# Patient Record
Sex: Male | Born: 1955 | Race: Black or African American | Hispanic: No | Marital: Single | Smoking: Current every day smoker
Health system: Southern US, Community
[De-identification: ages and names within clinical notes are randomized; demographics above are authoritative.]

## PROBLEM LIST (undated history)

## (undated) DIAGNOSIS — F79 Unspecified intellectual disabilities: Secondary | ICD-10-CM

## (undated) DIAGNOSIS — E7800 Pure hypercholesterolemia, unspecified: Secondary | ICD-10-CM

## (undated) DIAGNOSIS — F419 Anxiety disorder, unspecified: Secondary | ICD-10-CM

## (undated) DIAGNOSIS — I10 Essential (primary) hypertension: Secondary | ICD-10-CM

## (undated) DIAGNOSIS — N400 Enlarged prostate without lower urinary tract symptoms: Secondary | ICD-10-CM

## (undated) DIAGNOSIS — K219 Gastro-esophageal reflux disease without esophagitis: Secondary | ICD-10-CM

## (undated) DIAGNOSIS — K810 Acute cholecystitis: Secondary | ICD-10-CM

## (undated) DIAGNOSIS — R1110 Vomiting, unspecified: Secondary | ICD-10-CM

## (undated) DIAGNOSIS — F209 Schizophrenia, unspecified: Secondary | ICD-10-CM

## (undated) DIAGNOSIS — G4733 Obstructive sleep apnea (adult) (pediatric): Secondary | ICD-10-CM

## (undated) DIAGNOSIS — K222 Esophageal obstruction: Secondary | ICD-10-CM

## (undated) HISTORY — PX: OTHER SURGICAL HISTORY: SHX169

## (undated) HISTORY — DX: Unspecified intellectual disabilities: F79

## (undated) HISTORY — DX: Obstructive sleep apnea (adult) (pediatric): G47.33

## (undated) HISTORY — DX: Vomiting, unspecified: R11.10

---

## 1999-10-04 ENCOUNTER — Ambulatory Visit (HOSPITAL_BASED_OUTPATIENT_CLINIC_OR_DEPARTMENT_OTHER): Admission: RE | Admit: 1999-10-04 | Discharge: 1999-10-04 | Payer: Self-pay | Admitting: Oral Surgery

## 2008-07-16 ENCOUNTER — Emergency Department (HOSPITAL_COMMUNITY): Admission: EM | Admit: 2008-07-16 | Discharge: 2008-07-16 | Payer: Self-pay | Admitting: Emergency Medicine

## 2013-12-24 ENCOUNTER — Encounter (HOSPITAL_COMMUNITY): Payer: Self-pay | Admitting: Emergency Medicine

## 2013-12-24 ENCOUNTER — Emergency Department (HOSPITAL_COMMUNITY)
Admission: EM | Admit: 2013-12-24 | Discharge: 2013-12-24 | Disposition: A | Discharge status: Home / Self Care | Payer: Medicare Other | Attending: Emergency Medicine | Admitting: Emergency Medicine

## 2013-12-24 ENCOUNTER — Emergency Department (HOSPITAL_COMMUNITY): Payer: Medicare Other

## 2013-12-24 DIAGNOSIS — R197 Diarrhea, unspecified: Secondary | ICD-10-CM | POA: Insufficient documentation

## 2013-12-24 DIAGNOSIS — Z79899 Other long term (current) drug therapy: Secondary | ICD-10-CM | POA: Insufficient documentation

## 2013-12-24 DIAGNOSIS — F209 Schizophrenia, unspecified: Secondary | ICD-10-CM | POA: Insufficient documentation

## 2013-12-24 DIAGNOSIS — IMO0002 Reserved for concepts with insufficient information to code with codable children: Secondary | ICD-10-CM | POA: Diagnosis not present

## 2013-12-24 DIAGNOSIS — I10 Essential (primary) hypertension: Secondary | ICD-10-CM | POA: Diagnosis not present

## 2013-12-24 DIAGNOSIS — R112 Nausea with vomiting, unspecified: Secondary | ICD-10-CM | POA: Insufficient documentation

## 2013-12-24 DIAGNOSIS — R109 Unspecified abdominal pain: Secondary | ICD-10-CM | POA: Diagnosis not present

## 2013-12-24 DIAGNOSIS — F17200 Nicotine dependence, unspecified, uncomplicated: Secondary | ICD-10-CM | POA: Diagnosis not present

## 2013-12-24 DIAGNOSIS — R911 Solitary pulmonary nodule: Secondary | ICD-10-CM | POA: Diagnosis not present

## 2013-12-24 HISTORY — DX: Schizophrenia, unspecified: F20.9

## 2013-12-24 HISTORY — DX: Essential (primary) hypertension: I10

## 2013-12-24 LAB — COMPREHENSIVE METABOLIC PANEL
ALBUMIN: 4.6 g/dL (ref 3.5–5.2)
ALK PHOS: 75 U/L (ref 39–117)
ALT: 24 U/L (ref 0–53)
AST: 26 U/L (ref 0–37)
BILIRUBIN TOTAL: 0.3 mg/dL (ref 0.3–1.2)
BUN: 16 mg/dL (ref 6–23)
CHLORIDE: 96 meq/L (ref 96–112)
CO2: 31 mEq/L (ref 19–32)
Calcium: 9.6 mg/dL (ref 8.4–10.5)
Creatinine, Ser: 1.28 mg/dL (ref 0.50–1.35)
GFR calc Af Amer: 70 mL/min — ABNORMAL LOW (ref >90)
GFR calc non Af Amer: 61 mL/min — ABNORMAL LOW (ref >90)
Glucose, Bld: 119 mg/dL — ABNORMAL HIGH (ref 70–99)
Potassium: 3.3 mEq/L — ABNORMAL LOW (ref 3.7–5.3)
SODIUM: 141 meq/L (ref 137–147)
Total Protein: 8.4 g/dL — ABNORMAL HIGH (ref 6.0–8.3)

## 2013-12-24 LAB — CBC WITH DIFFERENTIAL/PLATELET
Basophils Absolute: 0 10*3/uL (ref 0.0–0.1)
Basophils Relative: 0 % (ref 0–1)
EOS PCT: 0 % (ref 0–5)
Eosinophils Absolute: 0 10*3/uL (ref 0.0–0.7)
HEMATOCRIT: 45.3 % (ref 39.0–52.0)
Hemoglobin: 15.8 g/dL (ref 13.0–17.0)
LYMPHS ABS: 1.3 10*3/uL (ref 0.7–4.0)
Lymphocytes Relative: 21 % (ref 12–46)
MCH: 31.3 pg (ref 26.0–34.0)
MCHC: 34.9 g/dL (ref 30.0–36.0)
MCV: 89.9 fL (ref 78.0–100.0)
MONO ABS: 0.3 10*3/uL (ref 0.1–1.0)
Monocytes Relative: 4 % (ref 3–12)
Neutro Abs: 4.7 10*3/uL (ref 1.7–7.7)
Neutrophils Relative %: 74 % (ref 43–77)
Platelets: 177 10*3/uL (ref 150–400)
RBC: 5.04 MIL/uL (ref 4.22–5.81)
RDW: 12.8 % (ref 11.5–15.5)
WBC: 6.4 10*3/uL (ref 4.0–10.5)

## 2013-12-24 LAB — URINALYSIS, ROUTINE W REFLEX MICROSCOPIC
BILIRUBIN URINE: NEGATIVE
Glucose, UA: NEGATIVE mg/dL
HGB URINE DIPSTICK: NEGATIVE
KETONES UR: NEGATIVE mg/dL
Leukocytes, UA: NEGATIVE
NITRITE: NEGATIVE
PH: 6.5 (ref 5.0–8.0)
Protein, ur: NEGATIVE mg/dL
SPECIFIC GRAVITY, URINE: 1.02 (ref 1.005–1.030)
Urobilinogen, UA: 0.2 mg/dL (ref 0.0–1.0)

## 2013-12-24 LAB — LIPASE, BLOOD: Lipase: 20 U/L (ref 11–59)

## 2013-12-24 MED ORDER — HYDROCODONE-ACETAMINOPHEN 5-325 MG PO TABS: 1.0000 | ORAL_TABLET | ORAL | Status: DC | PRN

## 2013-12-24 MED ORDER — PROMETHAZINE HCL 25 MG PO TABS: 25.0000 mg | ORAL_TABLET | Freq: Four times a day (QID) | ORAL | Status: DC | PRN

## 2013-12-24 MED ORDER — MORPHINE SULFATE 4 MG/ML IJ SOLN
4.0000 mg | Freq: Once | INTRAMUSCULAR | Status: AC
  Administered 2013-12-24: 4 mg via INTRAVENOUS
  Filled 2013-12-24: qty 1

## 2013-12-24 MED ORDER — SODIUM CHLORIDE 0.9 % IV BOLUS (SEPSIS)
1000.0000 mL | Freq: Once | INTRAVENOUS | Status: AC
  Administered 2013-12-24: 1000 mL via INTRAVENOUS

## 2013-12-24 MED ORDER — ONDANSETRON HCL 4 MG/2ML IJ SOLN
4.0000 mg | Freq: Once | INTRAMUSCULAR | Status: AC
  Administered 2013-12-24: 4 mg via INTRAVENOUS
  Filled 2013-12-24: qty 2

## 2013-12-24 NOTE — Discharge Instructions (Signed)
Abdominal Pain Many things can cause belly (abdominal) pain. Most times, the belly pain is not dangerous. The amount of belly pain does not tell how serious the problem may be. Many cases of belly pain can be watched and treated at home. HOME CARE   Do not take medicines that help you go poop (laxatives) unless told to by your doctor.  Only take medicine as told by your doctor.  Eat or drink as told by your doctor. Your doctor will tell you if you should be on a special diet. GET HELP RIGHT AWAY IF:   The pain does not go away.  You have a fever.  You keep throwing up (vomiting).  The pain changes and is only in the right or left part of the belly.  You have bloody or tarry looking poop. MAKE SURE YOU:   Understand these instructions.  Will watch your condition.  Will get help right away if you are not doing well or get worse. Document Released: 05/20/2008 Document Revised: 02/24/2012 Document Reviewed: 12/18/2009 [facility] Patient Information 2014 [place], [place].   Medication for pain and nausea. Return if worse. Clear liquids tonight.  Chest x-ray shows a nodule in the right lung.    This will need followup with the primary care physician.

## 2013-12-24 NOTE — ED Notes (Signed)
Patient with no complaints at this time. Respirations even and unlabored. Skin warm/dry. Discharge instructions reviewed with patient at this time. Patient given opportunity to voice concerns/ask questions. Patient discharged at this time and left Emergency Department with steady gait.   

## 2013-12-24 NOTE — ED Notes (Signed)
Denies nausea.  States abdomen is "sore".

## 2013-12-24 NOTE — ED Provider Notes (Signed)
CSN: [id number]     Arrival date & time 12/24/13  1559 History   First MD Initiated Contact with Patient 12/24/13 1623     Chief Complaint  Patient presents with  . Abdominal Pain   (Consider location/radiation/quality/duration/timing/severity/associated sxs/prior Treatment) HPI... level V caveat for schizophrenia.   Lower abdominal pain after eating hamburger and french fries earlier today with associated nausea, vomiting, diarrhea. He lives in a group home and is accompanied by a caregiver.  No fever, chills, dysuria, cough, stiff neck.  Past Medical History  Diagnosis Date  . Hypertension   . Schizophrenia    History reviewed. No pertinent past surgical history. History reviewed. No pertinent family history. History  Substance Use Topics  . Smoking status: Current Every Day Smoker  . Smokeless tobacco: Not on file  . Alcohol Use: No    Review of Systems  Unable to perform ROS: Psychiatric disorder    Allergies  Review of patient's allergies indicates no known allergies.  Home Medications   Current Outpatient Rx  Name  Route  Sig  Dispense  Refill  . acetaminophen (TYLENOL) 325 MG tablet   Oral   Take 650 mg by mouth every 6 (six) hours as needed for mild pain, moderate pain or fever.         [name] amLODipine (NORVASC) 5 MG tablet   Oral   Take 5 mg by mouth every morning.         [name] buPROPion (WELLBUTRIN SR) 150 MG 12 hr tablet   Oral   Take 150 mg by mouth 2 (two) times daily.         . cyclobenzaprine (FLEXERIL) 10 MG tablet   Oral   Take 10 mg by mouth at bedtime.         . famotidine (PEPCID) 40 MG tablet   Oral   Take 40 mg by mouth at bedtime.         . fluticasone (FLONASE) 50 MCG/ACT nasal spray   Each Nare   Place 2 sprays into both nostrils daily.         [name] gemfibrozil (LOPID) 600 MG tablet   Oral   Take 600 mg by mouth 2 (two) times daily.         [name] guaifenesin (Q-TUSSIN) 100 MG/5ML syrup   Oral   Take 200 mg by mouth every 4  (four) hours as needed for cough.         . metoCLOPramide (REGLAN) 5 MG tablet   Oral   Take 5 mg by mouth 4 (four) times daily -  before meals and at bedtime.         . metoprolol (LOPRESSOR) 50 MG tablet   Oral   Take 50 mg by mouth 2 (two) times daily.         [name] omeprazole (PRILOSEC) 20 MG capsule   Oral   Take 20 mg by mouth daily before breakfast.         . oxybutynin (DITROPAN) 5 MG tablet   Oral   Take 5 mg by mouth every morning.         . thioridazine (MELLARIL) 10 MG tablet   Oral   Take 150 mg by mouth 2 (two) times daily.         [name] triamterene-hydrochlorothiazide (MAXZIDE-25) 37.5-25 MG per tablet   Oral   Take 1 tablet by mouth every morning.         [name] HYDROcodone-acetaminophen (NORCO) 5-325 MG per  tablet   Oral   Take 1 tablet by mouth every 4 (four) hours as needed.   10 tablet   0   . promethazine (PHENERGAN) 25 MG tablet   Oral   Take 1 tablet (25 mg total) by mouth every 6 (six) hours as needed for nausea or vomiting.   10 tablet   0    BP 146/81  Pulse 66  Temp(Src) 97.5 F (36.4 C) (Oral)  Resp 20  SpO2 96% Physical Exam  Nursing note and vitals reviewed. Constitutional: He is oriented to person, place, and time. He appears well-developed and well-nourished.  HENT:  Head: Normocephalic and atraumatic.  Eyes: Conjunctivae and EOM are normal. Pupils are equal, round, and reactive to light.  Neck: Normal range of motion. Neck supple.  Cardiovascular: Normal rate, regular rhythm and normal heart sounds.   Pulmonary/Chest: Effort normal and breath sounds normal.  Abdominal: Soft. Bowel sounds are normal.   Minimal lower abdominal tenderness.  Musculoskeletal: Normal range of motion.  Neurological: He is alert and oriented to person, place, and time.  Skin: Skin is warm and dry.  Psychiatric:  Flat affect     ED Course  Procedures (including critical care time) Labs Review Labs Reviewed  COMPREHENSIVE METABOLIC PANEL -  Abnormal; Notable for the following:    Potassium 3.3 (*)    Glucose, Bld 119 (*)    Total Protein 8.4 (*)    GFR calc non Af Amer 61 (*)    GFR calc Af Amer 70 (*)    All other components within normal limits  CBC WITH DIFFERENTIAL  LIPASE, BLOOD  URINALYSIS, ROUTINE W REFLEX MICROSCOPIC   Imaging Review Dg Abd Acute W/chest  12/24/2013   CLINICAL DATA:  Abdominal pain  EXAM: ACUTE ABDOMEN SERIES (ABDOMEN 2 VIEW & CHEST 1 VIEW)  COMPARISON:  None.  FINDINGS: Cardiac shadow is mildly enlarged. The lungs are well aerated bilaterally. There is a rounded density measuring 2.5 cm in the right upper lung. Although this may be related to the anterior aspect of the right 1st rib this likely represents an underlying lung nodule further evaluation is recommended.  The abdomen shows a nonobstructive bowel gas pattern. No free air is seen. No abnormal mass or abnormal calcifications are noted. The bony structures are within normal limits.  IMPRESSION: 2.5 cm nodule overlying the right upper lobe. Further evaluation is recommended.  Nonspecific abdomen.   Electronically Signed   By: Alcide CleverMark  Lukens M.D.   On: 12/24/2013 19:11    EKG Interpretation   None       MDM   1. Abdominal pain    Patient feels better after IV fluids and pain management. Screening labs, urinalysis, acute abdominal series negative for acute findings.   2.5 cm nodule noted in right upper lobe of lung.  This was discussed with the caregiver. Patient has primary care followup.    Donnetta HutchingBrian Naba Sneed, MD 12/25/13 2136

## 2013-12-24 NOTE — ED Notes (Addendum)
abd pain ,onset after eating hamburger and fries, nausea, vomiting,diarrhea  Pt is from [facility]. Very slow to answer. Med list brought with him , but no history.

## 2013-12-24 NOTE — ED Notes (Signed)
Pt unable to urinate at this time.  

## 2013-12-29 DIAGNOSIS — B351 Tinea unguium: Secondary | ICD-10-CM | POA: Diagnosis not present

## 2013-12-29 DIAGNOSIS — I739 Peripheral vascular disease, unspecified: Secondary | ICD-10-CM | POA: Diagnosis not present

## 2014-01-11 DIAGNOSIS — K5900 Constipation, unspecified: Secondary | ICD-10-CM | POA: Diagnosis not present

## 2014-01-11 DIAGNOSIS — R3989 Other symptoms and signs involving the genitourinary system: Secondary | ICD-10-CM | POA: Diagnosis not present

## 2014-01-11 DIAGNOSIS — I10 Essential (primary) hypertension: Secondary | ICD-10-CM | POA: Diagnosis not present

## 2014-02-15 DIAGNOSIS — R39198 Other difficulties with micturition: Secondary | ICD-10-CM | POA: Diagnosis not present

## 2014-02-15 DIAGNOSIS — R3911 Hesitancy of micturition: Secondary | ICD-10-CM | POA: Diagnosis not present

## 2014-02-15 DIAGNOSIS — R351 Nocturia: Secondary | ICD-10-CM | POA: Diagnosis not present

## 2014-03-16 DIAGNOSIS — B351 Tinea unguium: Secondary | ICD-10-CM | POA: Diagnosis not present

## 2014-03-16 DIAGNOSIS — I739 Peripheral vascular disease, unspecified: Secondary | ICD-10-CM | POA: Diagnosis not present

## 2014-03-23 DIAGNOSIS — R351 Nocturia: Secondary | ICD-10-CM | POA: Diagnosis not present

## 2014-03-23 DIAGNOSIS — R3911 Hesitancy of micturition: Secondary | ICD-10-CM | POA: Diagnosis not present

## 2014-03-23 DIAGNOSIS — R39198 Other difficulties with micturition: Secondary | ICD-10-CM | POA: Diagnosis not present

## 2014-03-31 ENCOUNTER — Encounter (HOSPITAL_COMMUNITY): Payer: Self-pay | Admitting: Emergency Medicine

## 2014-03-31 ENCOUNTER — Emergency Department (HOSPITAL_COMMUNITY): Payer: Medicare Other

## 2014-03-31 ENCOUNTER — Emergency Department (HOSPITAL_COMMUNITY)
Admission: EM | Admit: 2014-03-31 | Discharge: 2014-04-01 | Disposition: A | Discharge status: Home / Self Care | Payer: Medicare Other | Attending: Emergency Medicine | Admitting: Emergency Medicine

## 2014-03-31 DIAGNOSIS — K2970 Gastritis, unspecified, without bleeding: Secondary | ICD-10-CM | POA: Diagnosis not present

## 2014-03-31 DIAGNOSIS — F17200 Nicotine dependence, unspecified, uncomplicated: Secondary | ICD-10-CM | POA: Insufficient documentation

## 2014-03-31 DIAGNOSIS — F209 Schizophrenia, unspecified: Secondary | ICD-10-CM | POA: Insufficient documentation

## 2014-03-31 DIAGNOSIS — R1110 Vomiting, unspecified: Secondary | ICD-10-CM | POA: Diagnosis not present

## 2014-03-31 DIAGNOSIS — Z79899 Other long term (current) drug therapy: Secondary | ICD-10-CM | POA: Diagnosis not present

## 2014-03-31 DIAGNOSIS — I10 Essential (primary) hypertension: Secondary | ICD-10-CM | POA: Diagnosis not present

## 2014-03-31 DIAGNOSIS — R1084 Generalized abdominal pain: Secondary | ICD-10-CM | POA: Diagnosis not present

## 2014-03-31 DIAGNOSIS — IMO0002 Reserved for concepts with insufficient information to code with codable children: Secondary | ICD-10-CM | POA: Diagnosis not present

## 2014-03-31 DIAGNOSIS — K759 Inflammatory liver disease, unspecified: Secondary | ICD-10-CM | POA: Diagnosis not present

## 2014-03-31 DIAGNOSIS — R109 Unspecified abdominal pain: Secondary | ICD-10-CM | POA: Diagnosis not present

## 2014-03-31 DIAGNOSIS — K7200 Acute and subacute hepatic failure without coma: Secondary | ICD-10-CM | POA: Diagnosis not present

## 2014-03-31 LAB — COMPREHENSIVE METABOLIC PANEL
ALBUMIN: 3.8 g/dL (ref 3.5–5.2)
ALK PHOS: 165 U/L — AB (ref 39–117)
ALT: 125 U/L — ABNORMAL HIGH (ref 0–53)
AST: 252 U/L — ABNORMAL HIGH (ref 0–37)
BUN: 11 mg/dL (ref 6–23)
CALCIUM: 9.3 mg/dL (ref 8.4–10.5)
CO2: 30 mEq/L (ref 19–32)
CREATININE: 1.09 mg/dL (ref 0.50–1.35)
Chloride: 97 mEq/L (ref 96–112)
GFR calc non Af Amer: 74 mL/min — ABNORMAL LOW (ref >90)
GFR, EST AFRICAN AMERICAN: 85 mL/min — AB (ref >90)
GLUCOSE: 104 mg/dL — AB (ref 70–99)
POTASSIUM: 3.7 meq/L (ref 3.7–5.3)
Sodium: 140 mEq/L (ref 137–147)
TOTAL PROTEIN: 7.7 g/dL (ref 6.0–8.3)
Total Bilirubin: 2.3 mg/dL — ABNORMAL HIGH (ref 0.3–1.2)

## 2014-03-31 LAB — CBC WITH DIFFERENTIAL/PLATELET
BASOS PCT: 0 % (ref 0–1)
Basophils Absolute: 0 10*3/uL (ref 0.0–0.1)
EOS PCT: 0 % (ref 0–5)
Eosinophils Absolute: 0 10*3/uL (ref 0.0–0.7)
HEMATOCRIT: 45.3 % (ref 39.0–52.0)
HEMOGLOBIN: 15.9 g/dL (ref 13.0–17.0)
LYMPHS ABS: 0.9 10*3/uL (ref 0.7–4.0)
Lymphocytes Relative: 12 % (ref 12–46)
MCH: 30.6 pg (ref 26.0–34.0)
MCHC: 35.1 g/dL (ref 30.0–36.0)
MCV: 87.3 fL (ref 78.0–100.0)
MONO ABS: 0.6 10*3/uL (ref 0.1–1.0)
Monocytes Relative: 8 % (ref 3–12)
NEUTROS PCT: 80 % — AB (ref 43–77)
Neutro Abs: 5.8 10*3/uL (ref 1.7–7.7)
Platelets: 201 10*3/uL (ref 150–400)
RBC: 5.19 MIL/uL (ref 4.22–5.81)
RDW: 13 % (ref 11.5–15.5)
WBC: 7.3 10*3/uL (ref 4.0–10.5)

## 2014-03-31 LAB — LIPASE, BLOOD: LIPASE: 19 U/L (ref 11–59)

## 2014-03-31 MED ORDER — ONDANSETRON HCL 4 MG/2ML IJ SOLN
4.0000 mg | Freq: Once | INTRAMUSCULAR | Status: AC
  Administered 2014-03-31: 4 mg via INTRAVENOUS
  Filled 2014-03-31: qty 2

## 2014-03-31 MED ORDER — HYDROMORPHONE HCL PF 1 MG/ML IJ SOLN
0.5000 mg | Freq: Once | INTRAMUSCULAR | Status: AC
  Administered 2014-03-31: 0.5 mg via INTRAVENOUS
  Filled 2014-03-31: qty 1

## 2014-03-31 MED ORDER — SODIUM CHLORIDE 0.9 % IV BOLUS (SEPSIS)
1000.0000 mL | Freq: Once | INTRAVENOUS | Status: AC
  Administered 2014-03-31: 1000 mL via INTRAVENOUS

## 2014-03-31 NOTE — ED Notes (Signed)
Pt reports generalized abd pain and vomiting for several days, no diarrhea.

## 2014-03-31 NOTE — ED Provider Notes (Signed)
CSN: [id number]     Arrival date & time    History  This chart was scribed for Kyle LennertJoseph L Lamiyah Schlotter, MD by Dorothey Basemania Sutton, ED Scribe. This patient was seen in room APA06/APA06 and the patient's care was started at 8:37 PM.    Chief Complaint  Patient presents with  . Abdominal Pain  . Emesis   Patient is a 58 y.o. male presenting with abdominal pain. The history is provided by the patient and the EMS personnel. No language interpreter was used.  Abdominal Pain Pain location:  Generalized Pain severity:  Unable to specify Onset quality:  Unable to specify Timing:  Unable to specify Progression:  Unable to specify Chronicity:  New Associated symptoms: nausea and vomiting   Associated symptoms: no chest pain, no cough, no fatigue and no hematuria   Nausea:    Severity:  Unable to specify   Onset quality:  Unable to specify   Timing:  Unable to specify   Progression:  Unable to specify Vomiting:    Quality:  Unable to specify   Severity:  Unable to specify   Timing:  Unable to specify   Progression:  Unable to specify Risk factors: has not had multiple surgeries    HPI Comments: Kyle Wade is a 58 y.o. Male with a history of schizophrenia brought in by EMS who presents to the Emergency Department complaining of an intermittent pain to the generalized abdomen with associated nausea and a few episodes of non-bilious, non-bloody emesis onset a few days ago (per EMS). When asked what his symptoms are, patient responds with "I don't know, they brought me up here because it hurts, but it's not that bad" and is unable to better specify the nature of his complaints. Patient also has a history of HTN .  Past Medical History  Diagnosis Date  . Hypertension   . Schizophrenia    History reviewed. No pertinent past surgical history. No family history on file. History  Substance Use Topics  . Smoking status: Current Every Day Smoker  . Smokeless tobacco: Not on file  . Alcohol Use: No     Review of Systems  Constitutional: Negative for appetite change and fatigue.  HENT: Negative for congestion, ear discharge and sinus pressure.   Eyes: Negative for discharge.  Respiratory: Negative for cough.   Cardiovascular: Negative for chest pain.  Gastrointestinal: Positive for nausea, vomiting and abdominal pain.  Genitourinary: Negative for frequency and hematuria.  Musculoskeletal: Negative for back pain.  Skin: Negative for rash.  Neurological: Negative for seizures.  Psychiatric/Behavioral: Negative for hallucinations.    Allergies  Review of patient's allergies indicates no known allergies.  Home Medications   Prior to Admission medications   Medication Sig Start Date End Date Taking? Authorizing Provider  acetaminophen (TYLENOL) 325 MG tablet Take 650 mg by mouth every 6 (six) hours as needed for mild pain, moderate pain or fever.    Historical Provider, MD  amLODipine (NORVASC) 5 MG tablet Take 5 mg by mouth every morning.    Historical Provider, MD  buPROPion (WELLBUTRIN SR) 150 MG 12 hr tablet Take 150 mg by mouth 2 (two) times daily.    Historical Provider, MD  cyclobenzaprine (FLEXERIL) 10 MG tablet Take 10 mg by mouth at bedtime.    Historical Provider, MD  famotidine (PEPCID) 40 MG tablet Take 40 mg by mouth at bedtime.    Historical Provider, MD  fluticasone (FLONASE) 50 MCG/ACT nasal spray Place 2 sprays into both nostrils  daily.    Historical Provider, MD  gemfibrozil (LOPID) 600 MG tablet Take 600 mg by mouth 2 (two) times daily.    Historical Provider, MD  guaifenesin (Q-TUSSIN) 100 MG/5ML syrup Take 200 mg by mouth every 4 (four) hours as needed for cough.    Historical Provider, MD  HYDROcodone-acetaminophen (NORCO) 5-325 MG per tablet Take 1 tablet by mouth every 4 (four) hours as needed. 12/24/13   Donnetta Hutching, MD  metoCLOPramide (REGLAN) 5 MG tablet Take 5 mg by mouth 4 (four) times daily -  before meals and at bedtime.    Historical Provider, MD   metoprolol (LOPRESSOR) 50 MG tablet Take 50 mg by mouth 2 (two) times daily.    Historical Provider, MD  omeprazole (PRILOSEC) 20 MG capsule Take 20 mg by mouth daily before breakfast.    Historical Provider, MD  oxybutynin (DITROPAN) 5 MG tablet Take 5 mg by mouth every morning.    Historical Provider, MD  promethazine (PHENERGAN) 25 MG tablet Take 1 tablet (25 mg total) by mouth every 6 (six) hours as needed for nausea or vomiting. 12/24/13   Donnetta Hutching, MD  thioridazine (MELLARIL) 10 MG tablet Take 150 mg by mouth 2 (two) times daily.    Historical Provider, MD  triamterene-hydrochlorothiazide (MAXZIDE-25) 37.5-25 MG per tablet Take 1 tablet by mouth every morning.    Historical Provider, MD   Triage Vitals: BP 151/91  Pulse 72  Temp(Src) 98.8 F (37.1 C) (Oral)  Resp 16  SpO2 96%  Physical Exam  Constitutional: He is oriented to person, place, and time. He appears well-developed.  HENT:  Head: Normocephalic.  Eyes: Conjunctivae and EOM are normal. No scleral icterus.  Neck: Neck supple. No thyromegaly present.  Cardiovascular: Normal rate and regular rhythm.  Exam reveals no gallop and no friction rub.   No murmur heard. Pulmonary/Chest: No stridor. He has no wheezes. He has no rales. He exhibits no tenderness.  Abdominal: He exhibits no distension. There is tenderness. There is no rebound.  Mild, generalized tenderness.   Musculoskeletal: Normal range of motion. He exhibits no edema.  Lymphadenopathy:    He has no cervical adenopathy.  Neurological: He is oriented to person, place, and time. He exhibits normal muscle tone. Coordination normal.  Skin: No rash noted. No erythema.  Psychiatric: He has a normal mood and affect. His behavior is normal.    ED Course  Procedures (including critical care time)  DIAGNOSTIC STUDIES: Oxygen Saturation is 96% on room air, normal by my interpretation.    COORDINATION OF CARE: 8:40 PM- Ordered CBC, CMP, and lipase. Ordered IV fluids,  Zofran, and Dilaudid to manage symptoms. Discussed treatment plan with patient at bedside and patient verbalized agreement.     Labs Review Labs Reviewed  CBC WITH DIFFERENTIAL - Abnormal; Notable for the following:    Neutrophils Relative % 80 (*)    All other components within normal limits  COMPREHENSIVE METABOLIC PANEL - Abnormal; Notable for the following:    Glucose, Bld 104 (*)    AST 252 (*)    ALT 125 (*)    Alkaline Phosphatase 165 (*)    Total Bilirubin 2.3 (*)    GFR calc non Af Amer 74 (*)    GFR calc Af Amer 85 (*)    All other components within normal limits  LIPASE, BLOOD  HEPATITIS PANEL, ACUTE    Imaging Review Ct Abdomen Pelvis Wo Contrast  03/31/2014   CLINICAL DATA:  Generalized abdominal pain  and vomiting.  EXAM: CT ABDOMEN AND PELVIS WITHOUT CONTRAST  TECHNIQUE: Multidetector CT imaging of the abdomen and pelvis was performed following the standard protocol without IV contrast.  COMPARISON:  DG ABD ACUTE W/CHEST dated 12/24/2013  FINDINGS: There is inflammation in the porta hepatis that abuts the gallbladder and also extends to abut the duodenum. The gallbladder is mildly distended and contains no visualized dense calculi by CT. No significant biliary dilatation is identified. Some edema is suspected along periportal tracts in the liver. Overall findings may reflect a wide variety of processes such as acute hepatitis, cholecystitis and peptic ulcer disease. Stranding also surrounds the portal vein and without contrast, portal vein thrombosis cannot be excluded. The liver shows no overt cirrhotic changes or visible masses by unenhanced CT. Further evaluation with either contrast-enhanced CT or abdominal ultrasound with additional hepatic duplex ultrasound may be helpful. There is no evidence of bowel perforation or focal abscess.  The pancreas shows no obvious inflammation or mass by unenhanced CT. The spleen is normal in size. No ascites is seen. The kidneys show cysts  bilaterally which are likely benign. No evidence of bowel obstruction or ileus. The bladder is unremarkable. No hernias are seen. Bony structures are unremarkable. There is a moderate-sized hiatal hernia. Visualized lung bases show bibasilar atelectasis.  IMPRESSION: Abnormal inflammation is centered in the region of the porta hepatis. Periportal edema also present as well as inflammation abutting the gallbladder and duodenum. Correlation suggested with laboratory tests. As above, either a contrast-enhanced CT study or additional ultrasound with evaluation of hepatic vasculature would be helpful.   Electronically Signed   By: Irish Lack M.D.   On: 03/31/2014 23:35     EKG Interpretation None      MDM   Final diagnoses:  None   Hepatitis.  I spoke with radiology and the picture is consistent with hepatitis.  Pt improving and will follow up next week with gi,    The chart was scribed for me under my direct supervision.  I personally performed the history, physical, and medical decision making and all procedures in the evaluation of this patient.Kyle Lennert, MD 04/01/14 [phone]

## 2014-04-01 LAB — HEPATITIS PANEL, ACUTE
HCV Ab: NEGATIVE
HEP A IGM: NONREACTIVE
HEP B C IGM: NONREACTIVE
HEP B S AG: NEGATIVE

## 2014-04-01 MED ORDER — HYDROCODONE-ACETAMINOPHEN 5-325 MG PO TABS: 1.0000 | ORAL_TABLET | Freq: Four times a day (QID) | ORAL | Status: DC | PRN

## 2014-04-01 MED ORDER — ONDANSETRON 4 MG PO TBDP: ORAL_TABLET | ORAL | Status: DC

## 2014-04-01 NOTE — Discharge Instructions (Signed)
Drink plenty of fluids.  Follow up with Dr. Karilyn Cota next week.  Return sooner if problems

## 2014-04-04 DIAGNOSIS — R1110 Vomiting, unspecified: Secondary | ICD-10-CM | POA: Diagnosis not present

## 2014-04-11 ENCOUNTER — Encounter (INDEPENDENT_AMBULATORY_CARE_PROVIDER_SITE_OTHER): Payer: Self-pay | Admitting: *Deleted

## 2014-04-21 DIAGNOSIS — F201 Disorganized schizophrenia: Secondary | ICD-10-CM | POA: Diagnosis not present

## 2014-05-02 ENCOUNTER — Encounter (INDEPENDENT_AMBULATORY_CARE_PROVIDER_SITE_OTHER): Payer: Self-pay | Admitting: Internal Medicine

## 2014-05-02 ENCOUNTER — Ambulatory Visit (INDEPENDENT_AMBULATORY_CARE_PROVIDER_SITE_OTHER): Payer: Medicare Other | Admitting: Internal Medicine

## 2014-05-02 VITALS — BP 90/70 | HR 60 | Temp 97.8°F | Ht 68.0 in | Wt 199.0 lb

## 2014-05-02 DIAGNOSIS — I10 Essential (primary) hypertension: Secondary | ICD-10-CM

## 2014-05-02 DIAGNOSIS — F209 Schizophrenia, unspecified: Secondary | ICD-10-CM | POA: Insufficient documentation

## 2014-05-02 DIAGNOSIS — R109 Unspecified abdominal pain: Secondary | ICD-10-CM

## 2014-05-02 NOTE — Patient Instructions (Signed)
Cmet, CT abdomen/pelvis with CM.

## 2014-05-02 NOTE — Progress Notes (Signed)
Subjective:     Patient ID: Kyle Wade, male   DOB: 09/12/1956, 58 y.o.   MRN: [id number]  HPI Here today for f/u after [facility]. Seen in the ED, transaminases were elevated and he had an abnormal CT. Care giver cannot tell me if he had a fever or not. [facility] his [facility] lasted for about 24 hrs and then resolved.   Acute Hepatitis panel was negative. Patient is a schizophrenic and it is hard to elicit a hx out of him. He presents today and states he is not having any abdominal pain. Appetite is good. There has been no weight loss. He usually has a BM once a day. No Tylenol. No IV drug use.  He will drink etoh if he goes to his brother's house about twice a year.   CMP     Component Value Date/Time   NA 140 03/31/2014 2215   K 3.7 03/31/2014 2215   CL 97 03/31/2014 2215   CO2 30 03/31/2014 2215   GLUCOSE 104* 03/31/2014 2215   BUN 11 03/31/2014 2215   CREATININE 1.09 03/31/2014 2215   CALCIUM 9.3 03/31/2014 2215   PROT 7.7 03/31/2014 2215   ALBUMIN 3.8 03/31/2014 2215   AST 252* 03/31/2014 2215   ALT 125* 03/31/2014 2215   ALKPHOS 165* 03/31/2014 2215   BILITOT 2.3* 03/31/2014 2215   GFRNONAA 74* 03/31/2014 2215   GFRAA 85* 03/31/2014 2215   CT abdomen/pelvis w/o CM: IMPRESSION:  Abnormal inflammation is centered in the region of the porta  No  hepatis. Periportal edema also present as well as inflammation  abutting the gallbladder and duodenum. Correlation suggested with  laboratory tests. As above, either a contrast-enhanced CT study or  additional ultrasound with evaluation of hepatic vasculature would  be helpful.      Review of Systems Past Medical History  Diagnosis Date  . Hypertension   . Schizophrenia     History reviewed. No pertinent past surgical history.  No Known Allergies  Current Outpatient Prescriptions on File Prior to Visit  Medication Sig Dispense Refill  . acetaminophen (TYLENOL) 325 MG  tablet Take 650 mg by mouth every 6 (six) hours as needed for mild pain, moderate pain or fever.      [name]. amLODipine (NORVASC) 5 MG tablet Take 5 mg by mouth every morning.      [name]. buPROPion (WELLBUTRIN SR) 150 MG 12 hr tablet Take 150 mg by mouth 2 (two) times daily.      . cyclobenzaprine (FLEXERIL) 10 MG tablet Take 10 mg by mouth at bedtime.      . famotidine (PEPCID) 40 MG tablet Take 40 mg by mouth at bedtime.      . fluticasone (FLONASE) 50 MCG/ACT nasal spray Place 2 sprays into both nostrils daily.      [name]. gemfibrozil (LOPID) 600 MG tablet Take 600 mg by mouth 2 (two) times daily.      [name]. guaifenesin (Q-TUSSIN) 100 MG/5ML syrup Take 200 mg by mouth every 4 (four) hours as needed for cough.      . metoCLOPramide (REGLAN) 5 MG tablet Take 5 mg by mouth 4 (four) times daily -  before meals and at bedtime.      . metoprolol (LOPRESSOR) 50 MG tablet Take 50 mg by mouth 2 (two) times daily.      [name]. omeprazole (PRILOSEC) 20 MG capsule Take 20 mg by mouth daily before breakfast.      .  oxybutynin (DITROPAN) 5 MG tablet Take 5 mg by mouth every morning.      . thioridazine (MELLARIL) 25 MG tablet Take 50-100 mg by mouth 2 (two) times daily. Patient takes 2 tablets in the morning and 4 tablets at bedtime      . triamterene-hydrochlorothiazide (MAXZIDE-25) 37.5-25 MG per tablet Take 1 tablet by mouth every morning.      . clindamycin (CLEOCIN) 300 MG capsule Take 600 mg by mouth once as needed. Prior to appointment      . HYDROcodone-acetaminophen (NORCO/VICODIN) 5-325 MG per tablet Take 1 tablet by mouth every 6 (six) hours as needed for moderate pain.  12 tablet  0   No current facility-administered medications on file prior to visit.        Objective:   Physical Exam  Filed Vitals:   05/02/14 1103  BP: 90/70  Pulse: 60  Temp: 97.8 F (36.6 C)  Height: 5\' 8"  (1.727 m)  Weight: 199 lb (90.266 kg)   Alert and oriented. Skin warm and dry. Oral mucosa is moist.   . Sclera anicteric,  conjunctivae is pink. Thyroid not enlarged. No cervical lymphadenopathy. Lungs clear. Heart regular rate and rhythm.  Abdomen is soft. Bowel sounds are positive. No hepatomegaly. No abdominal masses felt. No tenderness.  No edema to lower extremities.       Assessment:     Elevated liver enzymes and abnormal CT abdomen. ? Etiology. All symptoms have resolved at this time.     Plan:    CMET, CT abdomen/pelvis with CM. Further recommendations to follow.

## 2014-05-03 LAB — COMPREHENSIVE METABOLIC PANEL
ALT: 20 U/L (ref 0–53)
AST: 23 U/L (ref 0–37)
Albumin: 4.6 g/dL (ref 3.5–5.2)
Alkaline Phosphatase: 93 U/L (ref 39–117)
BILIRUBIN TOTAL: 0.8 mg/dL (ref 0.2–1.2)
BUN: 13 mg/dL (ref 6–23)
CO2: 28 mEq/L (ref 19–32)
CREATININE: 1.25 mg/dL (ref 0.50–1.35)
Calcium: 10 mg/dL (ref 8.4–10.5)
Chloride: 98 mEq/L (ref 96–112)
Glucose, Bld: 68 mg/dL — ABNORMAL LOW (ref 70–99)
Potassium: 3.4 mEq/L — ABNORMAL LOW (ref 3.5–5.3)
Sodium: 137 mEq/L (ref 135–145)
Total Protein: 7.4 g/dL (ref 6.0–8.3)

## 2014-05-05 ENCOUNTER — Ambulatory Visit (HOSPITAL_COMMUNITY)
Admission: RE | Admit: 2014-05-05 | Discharge: 2014-05-05 | Disposition: A | Discharge status: Home / Self Care | Payer: Medicare Other | Source: Ambulatory Visit | Attending: Internal Medicine | Admitting: Internal Medicine

## 2014-05-05 DIAGNOSIS — R109 Unspecified abdominal pain: Secondary | ICD-10-CM | POA: Insufficient documentation

## 2014-05-05 DIAGNOSIS — Q618 Other cystic kidney diseases: Secondary | ICD-10-CM | POA: Diagnosis not present

## 2014-05-05 DIAGNOSIS — K449 Diaphragmatic hernia without obstruction or gangrene: Secondary | ICD-10-CM | POA: Diagnosis not present

## 2014-05-05 DIAGNOSIS — M51379 Other intervertebral disc degeneration, lumbosacral region without mention of lumbar back pain or lower extremity pain: Secondary | ICD-10-CM | POA: Insufficient documentation

## 2014-05-05 DIAGNOSIS — M5137 Other intervertebral disc degeneration, lumbosacral region: Secondary | ICD-10-CM | POA: Diagnosis not present

## 2014-05-05 DIAGNOSIS — N281 Cyst of kidney, acquired: Secondary | ICD-10-CM | POA: Diagnosis not present

## 2014-05-05 DIAGNOSIS — I700 Atherosclerosis of aorta: Secondary | ICD-10-CM | POA: Insufficient documentation

## 2014-05-05 MED ORDER — IOHEXOL 300 MG/ML  SOLN
100.0000 mL | Freq: Once | INTRAMUSCULAR | Status: AC | PRN
  Administered 2014-05-05: 100 mL via INTRAVENOUS

## 2014-05-11 DIAGNOSIS — R7989 Other specified abnormal findings of blood chemistry: Secondary | ICD-10-CM | POA: Diagnosis not present

## 2014-07-12 DIAGNOSIS — Z5181 Encounter for therapeutic drug level monitoring: Secondary | ICD-10-CM | POA: Diagnosis not present

## 2014-07-12 DIAGNOSIS — Z79899 Other long term (current) drug therapy: Secondary | ICD-10-CM | POA: Diagnosis not present

## 2014-07-19 DIAGNOSIS — F201 Disorganized schizophrenia: Secondary | ICD-10-CM | POA: Diagnosis not present

## 2014-07-21 DIAGNOSIS — B351 Tinea unguium: Secondary | ICD-10-CM | POA: Diagnosis not present

## 2014-07-21 DIAGNOSIS — I739 Peripheral vascular disease, unspecified: Secondary | ICD-10-CM | POA: Diagnosis not present

## 2014-08-24 ENCOUNTER — Ambulatory Visit: Payer: Medicare Other | Admitting: Gastroenterology

## 2014-08-29 ENCOUNTER — Ambulatory Visit (INDEPENDENT_AMBULATORY_CARE_PROVIDER_SITE_OTHER): Payer: Medicare Other | Admitting: Gastroenterology

## 2014-08-29 ENCOUNTER — Other Ambulatory Visit: Payer: Self-pay | Admitting: Internal Medicine

## 2014-08-29 ENCOUNTER — Encounter: Payer: Self-pay | Admitting: Gastroenterology

## 2014-08-29 VITALS — BP 125/70 | HR 66 | Temp 97.4°F | Ht 68.0 in | Wt 197.6 lb

## 2014-08-29 DIAGNOSIS — Z1211 Encounter for screening for malignant neoplasm of colon: Secondary | ICD-10-CM | POA: Diagnosis not present

## 2014-08-29 DIAGNOSIS — K3189 Other diseases of stomach and duodenum: Secondary | ICD-10-CM

## 2014-08-29 DIAGNOSIS — R1013 Epigastric pain: Secondary | ICD-10-CM | POA: Insufficient documentation

## 2014-08-29 DIAGNOSIS — R7989 Other specified abnormal findings of blood chemistry: Secondary | ICD-10-CM | POA: Insufficient documentation

## 2014-08-29 DIAGNOSIS — R1310 Dysphagia, unspecified: Secondary | ICD-10-CM

## 2014-08-29 DIAGNOSIS — R945 Abnormal results of liver function studies: Principal | ICD-10-CM

## 2014-08-29 DIAGNOSIS — R109 Unspecified abdominal pain: Secondary | ICD-10-CM

## 2014-08-29 MED ORDER — PEG-KCL-NACL-NASULF-NA ASC-C 100 G PO SOLR: 1.0000 | ORAL | Status: DC

## 2014-08-29 MED ORDER — FLEET ENEMA 7-19 GM/118ML RE ENEM: 1.0000 | ENEMA | Freq: Once | RECTAL | Status: DC

## 2014-08-29 NOTE — Progress Notes (Signed)
Primary Care Physician:  Deloria Lair, MD Primary Gastroenterologist:  Dr. Gala Romney   Chief Complaint  Patient presents with  . Elevated LFT's    HPI:   Kyle Wade is a 58 year old male presenting here for elevated LFTs. Seen by Deberah Castle, NP in May 2015. CT at that time without acute findings. Prior CT a month earlier with non-specific inflammation in the porta hepatis. Patient and caregiver would like to establish care here.  Intermittent nausea. Caretaker present, states he does have abdominal pain. Won't eat anything when complaining of abdominal pain. Will drink fluids but this will "come back up" as well. 2-3 days later will be able to tolerate diet again. Intermittent vomiting in the middle of the night. No hematemesis. No melena. Unsure if he has seen any rectal bleeding. Denies diarrhea. Occasional burning in stomach and chest. In last 2 months, vomiting twice. Complains of stomach hurting routinely. No ETOH use. Vague esophageal dysphagia. Pill dysphagia.    Darlyn Read, Group Home manager present.   Past Medical History  Diagnosis Date  . Hypertension   . Schizophrenia   . Mental retardation   . Chronic vomiting   . OSA (obstructive sleep apnea)     no cpap machine    Past Surgical History  Procedure Laterality Date  . None      Current Outpatient Prescriptions  Medication Sig Dispense Refill  . amLODipine (NORVASC) 5 MG tablet Take 5 mg by mouth every morning.      [name] buPROPion (WELLBUTRIN SR) 150 MG 12 hr tablet Take 150 mg by mouth 2 (two) times daily.      . cyclobenzaprine (FLEXERIL) 10 MG tablet Take 10 mg by mouth at bedtime.      . docusate sodium (COLACE) 100 MG capsule Take 100 mg by mouth 2 (two) times daily.      . fluticasone (FLONASE) 50 MCG/ACT nasal spray Place 2 sprays into both nostrils daily.      [name] gemfibrozil (LOPID) 600 MG tablet Take 600 mg by mouth 2 (two) times daily.      [name] HYDROcodone-acetaminophen (NORCO/VICODIN) 5-325  MG per tablet Take 1 tablet by mouth every 6 (six) hours as needed for moderate pain.  12 tablet  0  . metoCLOPramide (REGLAN) 5 MG tablet Take 5 mg by mouth 4 (four) times daily -  before meals and at bedtime.      . metoprolol (LOPRESSOR) 50 MG tablet Take 50 mg by mouth 2 (two) times daily.      [name] omeprazole (PRILOSEC) 20 MG capsule Take 20 mg by mouth daily before breakfast.      . ondansetron (ZOFRAN) 4 MG tablet Take 4 mg by mouth every 8 (eight) hours as needed for nausea or vomiting.      [name] oxybutynin (DITROPAN) 5 MG tablet Take 5 mg by mouth every morning.      . tamsulosin (FLOMAX) 0.4 MG CAPS capsule Take 0.4 mg by mouth.      . thioridazine (MELLARIL) 25 MG tablet Take 50-100 mg by mouth 2 (two) times daily. Patient takes 2 tablets in the morning and 4 tablets at bedtime      . triamterene-hydrochlorothiazide (MAXZIDE-25) 37.5-25 MG per tablet Take 1 tablet by mouth every morning.      . peg 3350 powder (MOVIPREP) 100 G SOLR Take 1 kit (200 g total) by mouth as directed.  1 kit  0  . [START ON 09/21/2014] sodium phosphate (  FLEET) 7-19 GM/118ML ENEM Place 133 mLs (1 enema total) rectally once.  1 enema  0   No current facility-administered medications for this visit.    Allergies as of 08/29/2014  . (No Known Allergies)    Family History  Problem Relation Age of Onset  . Colon cancer      unknown    History   Social History  . Marital Status: Single    Spouse Name: N/A    Number of Children: N/A  . Years of Education: N/A   Occupational History  . Not on file.   Social History Main Topics  . Smoking status: Current Every Day Smoker  . Smokeless tobacco: Not on file     Comment: 4 cigarettes a day  . Alcohol Use: No  . Drug Use: No  . Sexual Activity: Not on file   Other Topics Concern  . Not on file   Social History Narrative  . No narrative on file    Review of Systems: Gen: see HPI CV: occasional palpitations Resp: Denies shortness of breath at rest  or with exertion. Denies wheezing or cough.  GI: see HPI GU : Denies urinary burning, urinary frequency, urinary hesitancy MS: Denies joint pain, muscle weakness, cramps, or limitation of movement.  Derm: Denies rash, itching, dry skin Psych: occasional confusion Heme: Denies bruising, bleeding, and enlarged lymph nodes.  Physical Exam: BP 125/70  Pulse 66  Temp(Src) 97.4 F (36.3 C) (Oral)  Ht 5' 8"  (1.727 m)  Wt 197 lb 9.6 oz (89.631 kg)  BMI 30.05 kg/m2 General:   Alert and oriented to person. Slow to respond. Unsure the year.  Head:  Normocephalic and atraumatic. Eyes:  Without icterus, sclera clear and conjunctiva pink.  Ears:  Normal auditory acuity. Nose:  No deformity, discharge,  or lesions. Mouth:  No deformity or lesions, oral mucosa pink.  Neck:  Supple, without mass or thyromegaly. Lungs:  Clear to auscultation bilaterally. No wheezes, rales, or rhonchi. No distress.  Heart:  S1, S2 present without murmurs appreciated.  Abdomen:  +BS, soft, mild TTP LUQ and non-distended. No HSM noted. No guarding or rebound. No masses appreciated.  Rectal:  Deferred  Msk:  Symmetrical without gross deformities. Normal posture. Extremities:  Without edema. Neurologic:  Alert and  oriented x4;  grossly normal neurologically. Skin:  Intact without significant lesions or rashes. Psych:  Alert and cooperative. Normal mood and affect.  May 2015: normal LFTs  April 2015:  Tbili 2.3 AST 252 ALT 125 Alk Phos 165   OUTSIDE LABS July 2015:  Tbili 2.3 AST 327 ALT 150 Alk Phos 125   Lab Results  Component Value Date   HEPAIGM NON REACTIVE 03/31/2014   HEPBIGM NON REACTIVE 03/31/2014    HCV antibody negative   Lab Results  Component Value Date   LIPASE 19 03/31/2014

## 2014-08-29 NOTE — Patient Instructions (Signed)
Please have blood work done today. We have also scheduled you for a special ultrasound to further assess your liver and gallbladder.   We have also scheduled you for a colonoscopy, upper endoscopy, and dilation if needed with [name].  Further recommendations to follow!

## 2014-09-01 DIAGNOSIS — R1310 Dysphagia, unspecified: Secondary | ICD-10-CM | POA: Insufficient documentation

## 2014-09-01 DIAGNOSIS — Z1211 Encounter for screening for malignant neoplasm of colon: Secondary | ICD-10-CM | POA: Insufficient documentation

## 2014-09-01 NOTE — Assessment & Plan Note (Signed)
Dilation at time of EGD.  

## 2014-09-01 NOTE — Assessment & Plan Note (Signed)
58 year old male with intermittent fluctuations in transaminases and slight bump in bilirubin on several different occasions, with documentation of normalization as well. Viral markers negative. CT on file from May 2015; gallbladder remains in situ. No ultrasound on file. Due to cognitive status, unable to determine if bumps in LFTs correlated with abdominal pain, N/V episodes. As LFTs have continued to fluctuate, doubt self-limiting viral illness. Need to assess with more in depth work-up to include autoimmune/PBC, unable to exclude biliary component. Needs [place] with elastography and blood work as ordered.

## 2014-09-01 NOTE — Assessment & Plan Note (Addendum)
Intermittent vague abdominal pain associated with N/V, decreased appetite. Burning sensation in upper abdomen and chest; on Prilosec once daily. Denies NSAIDs. Due to cognitive status, difficult to obtain thorough history. Differentials broad but concern for gastritis, PUD, biliary component, delayed gastric emptying remains. Solid food and pill dysphagia noted as well; differentials including uncontrolled GERD, web, ring, stricture, doubt malignancy. Needs upper GI tract evaluation via EGD.   Proceed with upper endoscopy and dilation in the near future with [name]. The risks, benefits, and alternatives have been discussed in detail with patient. They have stated understanding and desire to proceed.  Phenergan 12.5 mg IV on call due to polypharmacy

## 2014-09-01 NOTE — Assessment & Plan Note (Signed)
No prior colonoscopy. No concerning lower GI symptoms.   Proceed with TCS with [name] in near future: the risks, benefits, and alternatives have been discussed with the patient in detail. The patient states understanding and desires to proceed. Phenergan 12.5 mg IV on call

## 2014-09-02 ENCOUNTER — Other Ambulatory Visit: Payer: Self-pay | Admitting: Gastroenterology

## 2014-09-02 DIAGNOSIS — R7989 Other specified abnormal findings of blood chemistry: Secondary | ICD-10-CM | POA: Diagnosis not present

## 2014-09-03 DIAGNOSIS — R7989 Other specified abnormal findings of blood chemistry: Secondary | ICD-10-CM | POA: Diagnosis not present

## 2014-09-03 LAB — IRON AND TIBC
%SAT: 40 % (ref 20–55)
Iron: 143 ug/dL (ref 42–165)
TIBC: 361 ug/dL (ref 215–435)
UIBC: 218 ug/dL (ref 125–400)

## 2014-09-03 LAB — HEPATIC FUNCTION PANEL
ALBUMIN: 4.4 g/dL (ref 3.5–5.2)
ALK PHOS: 91 U/L (ref 39–117)
ALT: 21 U/L (ref 0–53)
AST: 24 U/L (ref 0–37)
Bilirubin, Direct: 0.2 mg/dL (ref 0.0–0.3)
Indirect Bilirubin: 0.4 mg/dL (ref 0.2–1.2)
TOTAL PROTEIN: 7.2 g/dL (ref 6.0–8.3)
Total Bilirubin: 0.6 mg/dL (ref 0.2–1.2)

## 2014-09-03 LAB — IGG, IGA, IGM
IGA: 247 mg/dL (ref 68–379)
IGG (IMMUNOGLOBIN G), SERUM: 1280 mg/dL (ref 650–1600)
IGM, SERUM: 38 mg/dL — AB (ref 41–251)

## 2014-09-03 LAB — FERRITIN: Ferritin: 336 ng/mL — ABNORMAL HIGH (ref 22–322)

## 2014-09-05 LAB — MITOCHONDRIAL ANTIBODIES: MITOCHONDRIAL M2 AB, IGG: 0 (ref <0.91)

## 2014-09-05 LAB — ANA: Anti Nuclear Antibody(ANA): NEGATIVE

## 2014-09-06 NOTE — Progress Notes (Signed)
cc'ed to pcp °

## 2014-09-08 LAB — ANTI-SMOOTH MUSCLE ANTIBODY, IGG: SMOOTH MUSCLE AB: 8 U (ref <20)

## 2014-09-13 ENCOUNTER — Encounter (HOSPITAL_COMMUNITY): Payer: Self-pay | Admitting: Pharmacy Technician

## 2014-09-14 ENCOUNTER — Other Ambulatory Visit: Payer: Self-pay

## 2014-09-14 DIAGNOSIS — R109 Unspecified abdominal pain: Secondary | ICD-10-CM

## 2014-09-14 NOTE — Progress Notes (Signed)
Quick Note:  LFTs again normal. Ferritin just slightly elevated, non-specific. Iron and TIBC normal. Autoimmune, PBC serologies normal/negative. Proceed with EGD and colonoscopy as planned. Was an ultrasound ever ordered? This would be good to have better visualization of gallbladder. ______

## 2014-09-14 NOTE — Progress Notes (Signed)
Quick Note:  Needs [place] of abdomen. ______

## 2014-09-19 ENCOUNTER — Ambulatory Visit (HOSPITAL_COMMUNITY)
Admission: RE | Admit: 2014-09-19 | Discharge: 2014-09-19 | Disposition: A | Discharge status: Home / Self Care | Payer: Medicare Other | Source: Ambulatory Visit | Attending: Internal Medicine | Admitting: Internal Medicine

## 2014-09-19 DIAGNOSIS — N281 Cyst of kidney, acquired: Secondary | ICD-10-CM | POA: Insufficient documentation

## 2014-09-19 DIAGNOSIS — R109 Unspecified abdominal pain: Secondary | ICD-10-CM

## 2014-09-19 DIAGNOSIS — K760 Fatty (change of) liver, not elsewhere classified: Secondary | ICD-10-CM | POA: Diagnosis not present

## 2014-09-19 DIAGNOSIS — K8020 Calculus of gallbladder without cholecystitis without obstruction: Secondary | ICD-10-CM | POA: Diagnosis not present

## 2014-09-21 ENCOUNTER — Encounter (HOSPITAL_COMMUNITY): Payer: Self-pay | Admitting: *Deleted

## 2014-09-21 ENCOUNTER — Other Ambulatory Visit: Payer: Self-pay

## 2014-09-21 ENCOUNTER — Telehealth: Payer: Self-pay | Admitting: Internal Medicine

## 2014-09-21 ENCOUNTER — Ambulatory Visit (HOSPITAL_COMMUNITY)
Admission: RE | Admit: 2014-09-21 | Discharge: 2014-09-21 | Disposition: A | Discharge status: Home Health | Payer: Medicare Other | Source: Ambulatory Visit | Attending: Internal Medicine | Admitting: Internal Medicine

## 2014-09-21 ENCOUNTER — Encounter (HOSPITAL_COMMUNITY)
Admission: RE | Disposition: A | Discharge status: Home Health | Payer: Self-pay | Source: Ambulatory Visit | Attending: Internal Medicine

## 2014-09-21 DIAGNOSIS — K5730 Diverticulosis of large intestine without perforation or abscess without bleeding: Secondary | ICD-10-CM | POA: Insufficient documentation

## 2014-09-21 DIAGNOSIS — G4733 Obstructive sleep apnea (adult) (pediatric): Secondary | ICD-10-CM | POA: Diagnosis not present

## 2014-09-21 DIAGNOSIS — K449 Diaphragmatic hernia without obstruction or gangrene: Secondary | ICD-10-CM | POA: Diagnosis not present

## 2014-09-21 DIAGNOSIS — F209 Schizophrenia, unspecified: Secondary | ICD-10-CM | POA: Diagnosis not present

## 2014-09-21 DIAGNOSIS — K222 Esophageal obstruction: Secondary | ICD-10-CM | POA: Diagnosis not present

## 2014-09-21 DIAGNOSIS — R1310 Dysphagia, unspecified: Secondary | ICD-10-CM | POA: Diagnosis present

## 2014-09-21 DIAGNOSIS — K648 Other hemorrhoids: Secondary | ICD-10-CM | POA: Insufficient documentation

## 2014-09-21 DIAGNOSIS — F17200 Nicotine dependence, unspecified, uncomplicated: Secondary | ICD-10-CM | POA: Insufficient documentation

## 2014-09-21 DIAGNOSIS — R7989 Other specified abnormal findings of blood chemistry: Secondary | ICD-10-CM | POA: Insufficient documentation

## 2014-09-21 DIAGNOSIS — I10 Essential (primary) hypertension: Secondary | ICD-10-CM | POA: Diagnosis not present

## 2014-09-21 DIAGNOSIS — F79 Unspecified intellectual disabilities: Secondary | ICD-10-CM | POA: Insufficient documentation

## 2014-09-21 DIAGNOSIS — Z1212 Encounter for screening for malignant neoplasm of rectum: Secondary | ICD-10-CM

## 2014-09-21 DIAGNOSIS — R109 Unspecified abdominal pain: Secondary | ICD-10-CM

## 2014-09-21 DIAGNOSIS — Z1211 Encounter for screening for malignant neoplasm of colon: Secondary | ICD-10-CM

## 2014-09-21 DIAGNOSIS — R1314 Dysphagia, pharyngoesophageal phase: Secondary | ICD-10-CM

## 2014-09-21 DIAGNOSIS — K3189 Other diseases of stomach and duodenum: Secondary | ICD-10-CM

## 2014-09-21 HISTORY — PX: COLONOSCOPY: SHX5424

## 2014-09-21 HISTORY — PX: MALONEY DILATION: SHX5535

## 2014-09-21 HISTORY — PX: ESOPHAGOGASTRODUODENOSCOPY: SHX5428

## 2014-09-21 HISTORY — PX: SAVORY DILATION: SHX5439

## 2014-09-21 SURGERY — COLONOSCOPY
Anesthesia: Moderate Sedation

## 2014-09-21 MED ORDER — PROMETHAZINE HCL 25 MG/ML IJ SOLN
INTRAMUSCULAR | Status: AC
  Filled 2014-09-21: qty 1

## 2014-09-21 MED ORDER — SODIUM CHLORIDE 0.9 % IV SOLN
INTRAVENOUS | Status: DC
Start: 2014-09-21 — End: 2014-09-21
  Administered 2014-09-21: 1000 mL via INTRAVENOUS

## 2014-09-21 MED ORDER — MIDAZOLAM HCL 5 MG/5ML IJ SOLN
INTRAMUSCULAR | Status: AC
  Filled 2014-09-21: qty 10

## 2014-09-21 MED ORDER — MEPERIDINE HCL 100 MG/ML IJ SOLN
INTRAMUSCULAR | Status: AC
  Filled 2014-09-21: qty 2

## 2014-09-21 MED ORDER — ONDANSETRON HCL 4 MG/2ML IJ SOLN
INTRAMUSCULAR | Status: AC
  Filled 2014-09-21: qty 2

## 2014-09-21 MED ORDER — MEPERIDINE HCL 100 MG/ML IJ SOLN
INTRAMUSCULAR | Status: DC | PRN
  Administered 2014-09-21: 50 mg via INTRAVENOUS

## 2014-09-21 MED ORDER — LIDOCAINE VISCOUS 2 % MT SOLN
OROMUCOSAL | Status: DC | PRN
  Administered 2014-09-21: 3 mL via OROMUCOSAL

## 2014-09-21 MED ORDER — LIDOCAINE VISCOUS 2 % MT SOLN
OROMUCOSAL | Status: AC
  Filled 2014-09-21: qty 15

## 2014-09-21 MED ORDER — ONDANSETRON HCL 4 MG/2ML IJ SOLN
INTRAMUSCULAR | Status: DC | PRN
  Administered 2014-09-21: 4 mg via INTRAVENOUS

## 2014-09-21 MED ORDER — MIDAZOLAM HCL 5 MG/5ML IJ SOLN
INTRAMUSCULAR | Status: DC | PRN
  Administered 2014-09-21: 2 mg via INTRAVENOUS

## 2014-09-21 MED ORDER — SIMETHICONE 40 MG/0.6ML PO SUSP
ORAL | Status: DC | PRN
  Administered 2014-09-21: 10:00:00

## 2014-09-21 MED ORDER — SODIUM CHLORIDE 0.9 % IJ SOLN
INTRAMUSCULAR | Status: AC
  Filled 2014-09-21: qty 10

## 2014-09-21 MED ORDER — PROMETHAZINE HCL 25 MG/ML IJ SOLN
12.5000 mg | Freq: Once | INTRAMUSCULAR | Status: AC
  Administered 2014-09-21: 12.5 mg via INTRAVENOUS

## 2014-09-21 NOTE — Telephone Encounter (Signed)
Tammy, RN from Short Stay called to let us know that RMR wanted patient to have a Gastric Emptying Study. He has gastroparesis and needs to retain food 48 hours after eating. Please call patient to set up.

## 2014-09-21 NOTE — H&P (View-Only) (Signed)
Primary Care Physician:  Deloria Lair, MD Primary Gastroenterologist:  Dr. Gala Romney   Chief Complaint  Patient presents with  . Elevated LFT's    HPI:   Kyle Wade is a 58 year old male presenting here for elevated LFTs. Seen by Deberah Castle, NP in May 2015. CT at that time without acute findings. Prior CT a month earlier with non-specific inflammation in the porta hepatis. Patient and caregiver would like to establish care here.  Intermittent nausea. Caretaker present, states he does have abdominal pain. Won't eat anything when complaining of abdominal pain. Will drink fluids but this will "come back up" as well. 2-3 days later will be able to tolerate diet again. Intermittent vomiting in the middle of the night. No hematemesis. No melena. Unsure if he has seen any rectal bleeding. Denies diarrhea. Occasional burning in stomach and chest. In last 2 months, vomiting twice. Complains of stomach hurting routinely. No ETOH use. Vague esophageal dysphagia. Pill dysphagia.    Darlyn Read, Group Home manager present.   Past Medical History  Diagnosis Date  . Hypertension   . Schizophrenia   . Mental retardation   . Chronic vomiting   . OSA (obstructive sleep apnea)     no cpap machine    Past Surgical History  Procedure Laterality Date  . None      Current Outpatient Prescriptions  Medication Sig Dispense Refill  . amLODipine (NORVASC) 5 MG tablet Take 5 mg by mouth every morning.      [name] buPROPion (WELLBUTRIN SR) 150 MG 12 hr tablet Take 150 mg by mouth 2 (two) times daily.      . cyclobenzaprine (FLEXERIL) 10 MG tablet Take 10 mg by mouth at bedtime.      . docusate sodium (COLACE) 100 MG capsule Take 100 mg by mouth 2 (two) times daily.      . fluticasone (FLONASE) 50 MCG/ACT nasal spray Place 2 sprays into both nostrils daily.      [name] gemfibrozil (LOPID) 600 MG tablet Take 600 mg by mouth 2 (two) times daily.      [name] HYDROcodone-acetaminophen (NORCO/VICODIN) 5-325  MG per tablet Take 1 tablet by mouth every 6 (six) hours as needed for moderate pain.  12 tablet  0  . metoCLOPramide (REGLAN) 5 MG tablet Take 5 mg by mouth 4 (four) times daily -  before meals and at bedtime.      . metoprolol (LOPRESSOR) 50 MG tablet Take 50 mg by mouth 2 (two) times daily.      [name] omeprazole (PRILOSEC) 20 MG capsule Take 20 mg by mouth daily before breakfast.      . ondansetron (ZOFRAN) 4 MG tablet Take 4 mg by mouth every 8 (eight) hours as needed for nausea or vomiting.      [name] oxybutynin (DITROPAN) 5 MG tablet Take 5 mg by mouth every morning.      . tamsulosin (FLOMAX) 0.4 MG CAPS capsule Take 0.4 mg by mouth.      . thioridazine (MELLARIL) 25 MG tablet Take 50-100 mg by mouth 2 (two) times daily. Patient takes 2 tablets in the morning and 4 tablets at bedtime      . triamterene-hydrochlorothiazide (MAXZIDE-25) 37.5-25 MG per tablet Take 1 tablet by mouth every morning.      . peg 3350 powder (MOVIPREP) 100 G SOLR Take 1 kit (200 g total) by mouth as directed.  1 kit  0  . [START ON 09/21/2014] sodium phosphate (  FLEET) 7-19 GM/118ML ENEM Place 133 mLs (1 enema total) rectally once.  1 enema  0   No current facility-administered medications for this visit.    Allergies as of 08/29/2014  . (No Known Allergies)    Family History  Problem Relation Age of Onset  . Colon cancer      unknown    History   Social History  . Marital Status: Single    Spouse Name: N/A    Number of Children: N/A  . Years of Education: N/A   Occupational History  . Not on file.   Social History Main Topics  . Smoking status: Current Every Day Smoker  . Smokeless tobacco: Not on file     Comment: 4 cigarettes a day  . Alcohol Use: No  . Drug Use: No  . Sexual Activity: Not on file   Other Topics Concern  . Not on file   Social History Narrative  . No narrative on file    Review of Systems: Gen: see HPI CV: occasional palpitations Resp: Denies shortness of breath at rest  or with exertion. Denies wheezing or cough.  GI: see HPI GU : Denies urinary burning, urinary frequency, urinary hesitancy MS: Denies joint pain, muscle weakness, cramps, or limitation of movement.  Derm: Denies rash, itching, dry skin Psych: occasional confusion Heme: Denies bruising, bleeding, and enlarged lymph nodes.  Physical Exam: BP 125/70  Pulse 66  Temp(Src) 97.4 F (36.3 C) (Oral)  Ht 5' 8"  (1.727 m)  Wt 197 lb 9.6 oz (89.631 kg)  BMI 30.05 kg/m2 General:   Alert and oriented to person. Slow to respond. Unsure the year.  Head:  Normocephalic and atraumatic. Eyes:  Without icterus, sclera clear and conjunctiva pink.  Ears:  Normal auditory acuity. Nose:  No deformity, discharge,  or lesions. Mouth:  No deformity or lesions, oral mucosa pink.  Neck:  Supple, without mass or thyromegaly. Lungs:  Clear to auscultation bilaterally. No wheezes, rales, or rhonchi. No distress.  Heart:  S1, S2 present without murmurs appreciated.  Abdomen:  +BS, soft, mild TTP LUQ and non-distended. No HSM noted. No guarding or rebound. No masses appreciated.  Rectal:  Deferred  Msk:  Symmetrical without gross deformities. Normal posture. Extremities:  Without edema. Neurologic:  Alert and  oriented x4;  grossly normal neurologically. Skin:  Intact without significant lesions or rashes. Psych:  Alert and cooperative. Normal mood and affect.  May 2015: normal LFTs  April 2015:  Tbili 2.3 AST 252 ALT 125 Alk Phos 165   OUTSIDE LABS July 2015:  Tbili 2.3 AST 327 ALT 150 Alk Phos 125   Lab Results  Component Value Date   HEPAIGM NON REACTIVE 03/31/2014   HEPBIGM NON REACTIVE 03/31/2014    HCV antibody negative   Lab Results  Component Value Date   LIPASE 19 03/31/2014

## 2014-09-21 NOTE — Op Note (Signed)
[facility] [address] [place], [address]   COLONOSCOPY PROCEDURE REPORT  PATIENT: Maven, Chalmers  MR#: [id number] BIRTHDATE: 02-Aug-1956 , 58  yrs. old GENDER: male ENDOSCOPIST: R.  Roetta Sessions, MD FACP [facility] REFERRED DH:WYSHU Tapper, M.D. PROCEDURE DATE:  10/20/14 PROCEDURE:   Colonoscopy, screening INDICATIONS:average risk for colorectal cancer. MEDICATIONS: Versed 2 mg IV and Demerol 50 mg IV.  Phenergan 12.5 mg IV.  Zofran 4 mg IV. ASA CLASS:       Class II  CONSENT: The risks, benefits, alternatives and imponderables including but not limited to bleeding, perforation as well as the possibility of a missed lesion have been reviewed.  The potential for biopsy, lesion removal, etc. have also been discussed. Questions have been answered.  All parties agreeable.  Please see the history and physical in the medical record for more information.  DESCRIPTION OF PROCEDURE:   After the risks benefits and alternatives of the procedure were thoroughly explained, informed consent was obtained.  The digital rectal exam revealed no abnormalities of the rectum.   The EG-[id number] ([id number])  endoscope was introduced through the anus and advanced to the terminal ileum which was intubated for a short distance. No adverse events experienced.   The quality of the prep was adequate.  The instrument was then slowly withdrawn as the colon was fully examined.      COLON FINDINGS: Patient had a normal appearing rectum aside from internal hemorrhoids.  The colonic mucosa appeared normal aside from a clustering of ascending colon diverticula; the distal 10 cm of terminal ileal mucosa appeared normal.  Retroflexion was performed. .  Withdrawal time=6 minutes 0 seconds.  The scope was withdrawn and the procedure completed. COMPLICATIONS: There were no immediate complications.  ENDOSCOPIC IMPRESSION:  Colonic diverticulosis (right-sided) Internal hemorrhoids. Otherwise normal  ileocolonoscopy .[name]  RECOMMENDATIONS: Repeat colonoscopy in 10 years for screening purposes. See EGD report.  eSigned:  R. Roetta Sessions, MD Jerrel Ivory [facility] 20-Oct-2014 10:18 AM   cc:  CPT CODES: ICD CODES:  The ICD and CPT codes recommended by this software are interpretations from the data that the clinical staff has captured with the software.  The verification of the translation of this report to the ICD and CPT codes and modifiers is the sole responsibility of the health care institution and practicing physician where this report was generated.  [employer] will not be held responsible for the validity of the ICD and CPT codes included on this report.  AMA assumes no liability for data contained or not contained herein. CPT is a Publishing rights manager of the [employer].  PATIENT NAME:  Ruvim, Zavatsky MR#: [id number]

## 2014-09-21 NOTE — Interval H&P Note (Signed)
History and Physical Interval Note:  09/21/2014 9:38 AM  Kyle Wade  has presented today for surgery, with the diagnosis of ABDOMINAL PAIN, SCREENING COLONSCOPY  The various methods of treatment have been discussed with the patient and family. After consideration of risks, benefits and other options for treatment, the patient has consented to  Procedure(s) with comments: COLONOSCOPY (N/A) - 9:15 - moved to 9:30 - Ginger to notify pt ESOPHAGOGASTRODUODENOSCOPY (EGD) (N/A) SAVORY DILATION (N/A) MALONEY DILATION (N/A) as a surgical intervention .  The patient's history has been reviewed, patient examined, no change in status, stable for surgery.  I have reviewed the patient's chart and labs.  Questions were answered to the patient's satisfaction.     Kyle Wade  No change. Ultrasound cholelithiasis. EGD with esophageal dilation as appropriate followed by a screening colonoscopy.  The risks, benefits, limitations, imponderables and alternatives regarding both EGD and colonoscopy have been reviewed with the patient. Questions have been answered. All parties agreeable.

## 2014-09-21 NOTE — Discharge Instructions (Signed)
EGD Discharge instructions Please read the instructions outlined below and refer to this sheet in the next few weeks. These discharge instructions provide you with general information on caring for yourself after you leave the hospital. Your doctor may also give you specific instructions. While your treatment has been planned according to the most current medical practices available, unavoidable complications occasionally occur. If you have any problems or questions after discharge, please call your doctor. ACTIVITY  You may resume your regular activity but move at a slower pace for the next 24 hours.   Take frequent rest periods for the next 24 hours.   Walking will help expel (get rid of) the air and reduce the bloated feeling in your abdomen.   No driving for 24 hours (because of the anesthesia (medicine) used during the test).   You may shower.   Do not sign any important legal documents or operate any machinery for 24 hours (because of the anesthesia used during the test).  NUTRITION  Drink plenty of fluids.   You may resume your normal diet.   Begin with a light meal and progress to your normal diet.   Avoid alcoholic beverages for 24 hours or as instructed by your caregiver.  MEDICATIONS  You may resume your normal medications unless your caregiver tells you otherwise.  WHAT YOU CAN EXPECT TODAY  You may experience abdominal discomfort such as a feeling of fullness or gas pains.  FOLLOW-UP  Your doctor will discuss the results of your test with you.  SEEK IMMEDIATE MEDICAL ATTENTION IF ANY OF THE FOLLOWING OCCUR:  Excessive nausea (feeling sick to your stomach) and/or vomiting.   Severe abdominal pain and distention (swelling).   Trouble swallowing.   Temperature over 101 F (37.8 C).   Rectal bleeding or vomiting of blood.   Colonoscopy Discharge Instructions  Read the instructions outlined below and refer to this sheet in the next few weeks. These  discharge instructions provide you with general information on caring for yourself after you leave the hospital. Your doctor may also give you specific instructions. While your treatment has been planned according to the most current medical practices available, unavoidable complications occasionally occur. If you have any problems or questions after discharge, call Dr. Gala Romney at [phone]. ACTIVITY  You may resume your regular activity, but move at a slower pace for the next 24 hours.   Take frequent rest periods for the next 24 hours.   Walking will help get rid of the air and reduce the bloated feeling in your belly (abdomen).   No driving for 24 hours (because of the medicine (anesthesia) used during the test).    Do not sign any important legal documents or operate any machinery for 24 hours (because of the anesthesia used during the test).  NUTRITION  Drink plenty of fluids.   You may resume your normal diet as instructed by your doctor.   Begin with a light meal and progress to your normal diet. Heavy or fried foods are harder to digest and may make you feel sick to your stomach (nauseated).   Avoid alcoholic beverages for 24 hours or as instructed.  MEDICATIONS  You may resume your normal medications unless your doctor tells you otherwise.  WHAT YOU CAN EXPECT TODAY  Some feelings of bloating in the abdomen.   Passage of more gas than usual.   Spotting of blood in your stool or on the toilet paper.  IF YOU HAD POLYPS REMOVED DURING THE COLONOSCOPY:  No aspirin products for 7 days or as instructed.   No alcohol for 7 days or as instructed.   Eat a soft diet for the next 24 hours.  FINDING OUT THE RESULTS OF YOUR TEST Not all test results are available during your visit. If your test results are not back during the visit, make an appointment with your caregiver to find out the results. Do not assume everything is normal if you have not heard from your caregiver or the  medical facility. It is important for you to follow up on all of your test results.  SEEK IMMEDIATE MEDICAL ATTENTION IF:  You have more than a spotting of blood in your stool.   Your belly is swollen (abdominal distention).   You are nauseated or vomiting.   You have a temperature over 101.   You have abdominal pain or discomfort that is severe or gets worse throughout the day.    Repeat screening colonoscopy in 10 years; diverticulosis information provided  Schedule solid phase gastric emptying study to evaluate for gastroparesis (retained food in the stomach 48 hours after eating)  No Reglan or metoclopramide for 24 hours prior to GES  Further recommendations to follow.  Diverticulosis Diverticulosis is the condition that develops when small pouches (diverticula) form in the wall of your colon. Your colon, or large intestine, is where water is absorbed and stool is formed. The pouches form when the inside layer of your colon pushes through weak spots in the outer layers of your colon. CAUSES  No one knows exactly what causes diverticulosis. RISK FACTORS  Being older than 50. Your risk for this condition increases with age. Diverticulosis is rare in people younger than 40 years. By age 80, almost everyone has it.  Eating a low-fiber diet.  Being frequently constipated.  Being overweight.  Not getting enough exercise.  Smoking.  Taking over-the-counter pain medicines, like aspirin and ibuprofen. SYMPTOMS  Most people with diverticulosis do not have symptoms. DIAGNOSIS  Because diverticulosis often has no symptoms, health care providers often discover the condition during an exam for other colon problems. In many cases, a health care provider will diagnose diverticulosis while using a flexible scope to examine the colon (colonoscopy). TREATMENT  If you have never developed an infection related to diverticulosis, you may not need treatment. If you have had an infection  before, treatment may include:  Eating more fruits, vegetables, and grains.  Taking a fiber supplement.  Taking a live bacteria supplement (probiotic).  Taking medicine to relax your colon. HOME CARE INSTRUCTIONS   Drink at least 6-8 glasses of water each day to prevent constipation.  Try not to strain when you have a bowel movement.  Keep all follow-up appointments. If you have had an infection before:  Increase the fiber in your diet as directed by your health care provider or dietitian.  Take a dietary fiber supplement if your health care provider approves.  Only take medicines as directed by your health care provider. SEEK MEDICAL CARE IF:   You have abdominal pain.  You have bloating.  You have cramps.  You have not gone to the bathroom in 3 days. SEEK IMMEDIATE MEDICAL CARE IF:   Your pain gets worse.  Yourbloating becomes very bad.  You have a fever or chills, and your symptoms suddenly get worse.  You begin vomiting.  You have bowel movements that are bloody or black. MAKE SURE YOU:  Understand these instructions.  Will watch your condition.  Will  get help right away if you are not doing well or get worse. Document Released: 08/29/2004 Document Revised: 12/07/2013 Document Reviewed: 10/27/2013 [facility] Patient Information 2015 [place], [place]. This information is not intended to replace advice given to you by your health care provider. Make sure you discuss any questions you have with your health care provider.

## 2014-09-21 NOTE — Telephone Encounter (Signed)
Pt is set up for GES on 09-27-14 @ 10:00- he will need to be NPO

## 2014-09-21 NOTE — Telephone Encounter (Signed)
instruction are in the mail

## 2014-09-21 NOTE — Op Note (Signed)
[facility] [address] [place], [address]   ENDOSCOPY PROCEDURE REPORT  PATIENT: Kyle Wade, Kyle Wade  MR#: [id number] BIRTHDATE: 02-19-1956 , 58  yrs. old GENDER: male ENDOSCOPIST: R.  Roetta Sessions, MD FACP FACG REFERRED BY:  Wyvonnia Lora, M.D. PROCEDURE DATE:  2014-09-27 PROCEDURE:  Elease Hashimoto dilation of esophagus and EGD, diagnostic INDICATIONS:  dysphagia; dyspepsia. MEDICATIONS: Versed 2 mg IV and Demerol 50 mg IV.  Phenergan12.5 mg IV.  Zofran 4 mg IV.  Xylocaine gel orally. ASA CLASS:      Class II  CONSENT: The risks, benefits, limitations, alternatives and imponderables have been discussed.  The potential for biopsy, esophogeal dilation, etc. have also been reviewed.  Questions have been answered.  All parties agreeable.  Please see the history and physical in the medical record for more information.  DESCRIPTION OF PROCEDURE: After the risks benefits and alternatives of the procedure were thoroughly explained, informed consent was obtained.  The EG-[id number] ([id number]) endoscope was introduced through the mouth and advanced to the second portion of the duodenum , limited by Without limitations. The instrument was slowly withdrawn as the mucosa was fully examined.    Subtle noncritical Schatzki's ring; otherwise normal esophagus. Stomach had some retained food debris which easily washed and lavaged out.  Patient had a 3 cm hiatal hernia.  Otherwise, the gastric mucosa appeared normal.  Patent pylorus.  Normal first and second portion of the duodenum.  A 56 French Maloney dilator was passed to full insertion easily.  A look back revealed a superficial tear through the upper esophageal mucosa otherwise, no complications related to this maneuver. Retroflexed views revealed as previously described.     The scope was then withdrawn from the patient and the procedure completed.  COMPLICATIONS: There were no immediate complications.  ENDOSCOPIC  IMPRESSION:  Subtle Schatzki's ring-status post Maloney dilation. Query occult cervical esophageal web. Hiatal hernia. Some retained gastric contents. Query gastroparesis. ..  RECOMMENDATIONS:  Solid Phase Gastric Emptying Study. See Colonoscopy Report.   REPEAT EXAM:  eSigned:  R. Roetta Sessions, MD Jerrel Ivory [facility] 2014-09-27 10:01 AM    CC:  CPT CODES: ICD CODES:  The ICD and CPT codes recommended by this software are interpretations from the data that the clinical staff has captured with the software.  The verification of the translation of this report to the ICD and CPT codes and modifiers is the sole responsibility of the health care institution and practicing physician where this report was generated.  [employer] will not be held responsible for the validity of the ICD and CPT codes included on this report.  AMA assumes no liability for data contained or not contained herein. CPT is a Publishing rights manager of the [employer].  PATIENT NAME:  Kyle Wade, Kyle Wade MR#: [id number]

## 2014-09-23 ENCOUNTER — Encounter (HOSPITAL_COMMUNITY): Payer: Self-pay | Admitting: Internal Medicine

## 2014-09-27 ENCOUNTER — Encounter (HOSPITAL_COMMUNITY): Payer: Medicaid Other

## 2014-10-12 DIAGNOSIS — K219 Gastro-esophageal reflux disease without esophagitis: Secondary | ICD-10-CM | POA: Diagnosis not present

## 2014-10-12 DIAGNOSIS — R7301 Impaired fasting glucose: Secondary | ICD-10-CM | POA: Diagnosis not present

## 2014-10-12 DIAGNOSIS — G4733 Obstructive sleep apnea (adult) (pediatric): Secondary | ICD-10-CM | POA: Diagnosis not present

## 2014-10-12 DIAGNOSIS — Z23 Encounter for immunization: Secondary | ICD-10-CM | POA: Diagnosis not present

## 2014-10-12 DIAGNOSIS — Z0000 Encounter for general adult medical examination without abnormal findings: Secondary | ICD-10-CM | POA: Diagnosis not present

## 2014-10-12 DIAGNOSIS — E781 Pure hyperglyceridemia: Secondary | ICD-10-CM | POA: Diagnosis not present

## 2014-10-12 DIAGNOSIS — I059 Rheumatic mitral valve disease, unspecified: Secondary | ICD-10-CM | POA: Diagnosis not present

## 2014-10-12 DIAGNOSIS — I10 Essential (primary) hypertension: Secondary | ICD-10-CM | POA: Diagnosis not present

## 2014-10-20 DIAGNOSIS — Z23 Encounter for immunization: Secondary | ICD-10-CM | POA: Diagnosis not present

## 2014-10-27 LAB — ANTI-SMOOTH MUSCLE ANTIBODY, IGG
Mitochondrial Ab: 9.2
Smooth Muscle Ab: 10

## 2014-11-01 DIAGNOSIS — F209 Schizophrenia, unspecified: Secondary | ICD-10-CM | POA: Diagnosis not present

## 2014-11-01 DIAGNOSIS — F71 Moderate intellectual disabilities: Secondary | ICD-10-CM | POA: Diagnosis not present

## 2014-11-17 DIAGNOSIS — I739 Peripheral vascular disease, unspecified: Secondary | ICD-10-CM | POA: Diagnosis not present

## 2014-11-17 DIAGNOSIS — B351 Tinea unguium: Secondary | ICD-10-CM | POA: Diagnosis not present

## 2014-11-17 DIAGNOSIS — L110 Acquired keratosis follicularis: Secondary | ICD-10-CM | POA: Diagnosis not present

## 2014-12-06 DIAGNOSIS — I059 Rheumatic mitral valve disease, unspecified: Secondary | ICD-10-CM | POA: Diagnosis not present

## 2014-12-06 DIAGNOSIS — F99 Mental disorder, not otherwise specified: Secondary | ICD-10-CM | POA: Diagnosis not present

## 2014-12-06 DIAGNOSIS — F79 Unspecified intellectual disabilities: Secondary | ICD-10-CM | POA: Diagnosis not present

## 2015-02-01 DIAGNOSIS — F71 Moderate intellectual disabilities: Secondary | ICD-10-CM | POA: Diagnosis not present

## 2015-02-01 DIAGNOSIS — F209 Schizophrenia, unspecified: Secondary | ICD-10-CM | POA: Diagnosis not present

## 2015-02-02 DIAGNOSIS — B351 Tinea unguium: Secondary | ICD-10-CM | POA: Diagnosis not present

## 2015-02-02 DIAGNOSIS — I739 Peripheral vascular disease, unspecified: Secondary | ICD-10-CM | POA: Diagnosis not present

## 2015-02-02 DIAGNOSIS — L110 Acquired keratosis follicularis: Secondary | ICD-10-CM | POA: Diagnosis not present

## 2015-02-03 DIAGNOSIS — Z79899 Other long term (current) drug therapy: Secondary | ICD-10-CM | POA: Diagnosis not present

## 2015-03-31 DIAGNOSIS — K529 Noninfective gastroenteritis and colitis, unspecified: Secondary | ICD-10-CM | POA: Diagnosis not present

## 2015-04-17 DIAGNOSIS — R3919 Other difficulties with micturition: Secondary | ICD-10-CM | POA: Diagnosis not present

## 2015-04-17 DIAGNOSIS — R351 Nocturia: Secondary | ICD-10-CM | POA: Diagnosis not present

## 2015-04-17 DIAGNOSIS — R350 Frequency of micturition: Secondary | ICD-10-CM | POA: Diagnosis not present

## 2015-04-20 DIAGNOSIS — I739 Peripheral vascular disease, unspecified: Secondary | ICD-10-CM | POA: Diagnosis not present

## 2015-04-20 DIAGNOSIS — B351 Tinea unguium: Secondary | ICD-10-CM | POA: Diagnosis not present

## 2015-04-20 DIAGNOSIS — L110 Acquired keratosis follicularis: Secondary | ICD-10-CM | POA: Diagnosis not present

## 2015-04-26 ENCOUNTER — Telehealth: Payer: Self-pay | Admitting: Internal Medicine

## 2015-04-26 ENCOUNTER — Encounter: Payer: Self-pay | Admitting: Gastroenterology

## 2015-04-26 ENCOUNTER — Ambulatory Visit: Payer: Medicare Other | Admitting: Gastroenterology

## 2015-04-26 NOTE — Telephone Encounter (Signed)
PATIENT WAS A NO SHOW AND LETTER WAS SENT  °

## 2015-05-03 DIAGNOSIS — F209 Schizophrenia, unspecified: Secondary | ICD-10-CM | POA: Diagnosis not present

## 2015-05-03 DIAGNOSIS — F71 Moderate intellectual disabilities: Secondary | ICD-10-CM | POA: Diagnosis not present

## 2015-06-02 DIAGNOSIS — R3919 Other difficulties with micturition: Secondary | ICD-10-CM | POA: Diagnosis not present

## 2015-06-02 DIAGNOSIS — R350 Frequency of micturition: Secondary | ICD-10-CM | POA: Diagnosis not present

## 2015-06-06 DIAGNOSIS — I10 Essential (primary) hypertension: Secondary | ICD-10-CM | POA: Diagnosis not present

## 2015-06-06 DIAGNOSIS — I059 Rheumatic mitral valve disease, unspecified: Secondary | ICD-10-CM | POA: Diagnosis not present

## 2015-06-06 DIAGNOSIS — R634 Abnormal weight loss: Secondary | ICD-10-CM | POA: Diagnosis not present

## 2015-06-16 ENCOUNTER — Ambulatory Visit (INDEPENDENT_AMBULATORY_CARE_PROVIDER_SITE_OTHER): Payer: Medicare Other | Admitting: Gastroenterology

## 2015-06-16 ENCOUNTER — Encounter: Payer: Self-pay | Admitting: Gastroenterology

## 2015-06-16 VITALS — BP 116/76 | HR 60 | Temp 97.5°F | Ht 68.0 in | Wt 178.0 lb

## 2015-06-16 DIAGNOSIS — R1013 Epigastric pain: Secondary | ICD-10-CM

## 2015-06-16 DIAGNOSIS — R945 Abnormal results of liver function studies: Principal | ICD-10-CM

## 2015-06-16 DIAGNOSIS — R7989 Other specified abnormal findings of blood chemistry: Secondary | ICD-10-CM

## 2015-06-16 NOTE — Progress Notes (Signed)
Referring Provider: [name]., MD Primary Care Physician:  [name], MD  Primary GI: [name]   Chief Complaint  Patient presents with  . Follow-up    doing OK    HPI:   Kyle Wade is a 59 y.o. male presenting today in follow-up with a history of dyspepsia to include abdominal pain, N/V, decreased appetite. EGD without significant findings but Schatzki's ring dilated due to history of dysphagia. Some retained gastric contents noted with recommendations for gastric emptying study. This was not completed. Also notable history of fluctuating transaminases but also documentation of normalization. Viral markers negative, autoimmune/PBC work-up negative. [place] in OCt 2015 with fatty liver, cholelithiasis.  Last LFTs normal in Sept 2015.   Colonoscopy completed with routine screening due in 10 years. 20 lbs down from last visit. No vomiting lately. No abdominal pain. Appetite good.   Past Medical History  Diagnosis Date  . Hypertension   . Schizophrenia   . Mental retardation   . Chronic vomiting   . OSA (obstructive sleep apnea)     no cpap machine    Past Surgical History  Procedure Laterality Date  . None    . Colonoscopy N/A 09/21/2014    [name]: right-sided diverticulosis, internal hemorrhoids. Repeat in 10 years.   . Esophagogastroduodenoscopy N/A 09/21/2014    [name]: Subtle non-critical Schatzki's ring s/p 32 F dilation, query occult cervical esophageal web. Hiatal hernia. Question gastroparesis.   Gaspar Bidding dilation N/A 09/21/2014    Procedure: SAVORY DILATION;  Surgeon: [name], MD;  [facility];  Service: Endoscopy;  Laterality: N/A;  Elease Hashimoto dilation N/A 09/21/2014    Procedure: Elease Hashimoto DILATION;  Surgeon: [name], MD;  Location: [facility];  Service: Endoscopy;  Laterality: N/A;    Current Outpatient Prescriptions  Medication Sig Dispense Refill  . amLODipine (NORVASC) 5 MG tablet Take 5 mg by mouth every morning.      [name] buPROPion (WELLBUTRIN SR) 150 MG 12 hr tablet Take 150 mg by mouth 2 (two) times daily.    . cyclobenzaprine (FLEXERIL) 10 MG tablet Take 10 mg by mouth at bedtime.    . docusate sodium (COLACE) 100 MG capsule Take 100 mg by mouth 2 (two) times daily.    . famotidine (PEPCID) 40 MG tablet Take 40 mg by mouth at bedtime.    . fluticasone (FLONASE) 50 MCG/ACT nasal spray Place 2 sprays into both nostrils daily.    [name] gemfibrozil (LOPID) 600 MG tablet Take 600 mg by mouth 2 (two) times daily.    . metoCLOPramide (REGLAN) 5 MG tablet Take 5 mg by mouth 4 (four) times daily -  before meals and at bedtime.    . metoprolol (LOPRESSOR) 50 MG tablet Take 50 mg by mouth 2 (two) times daily.    [name] omeprazole (PRILOSEC) 20 MG capsule Take 20 mg by mouth daily before breakfast.    . oxybutynin (DITROPAN) 5 MG tablet Take 5 mg by mouth every morning.    . tamsulosin (FLOMAX) 0.4 MG CAPS capsule Take 0.4 mg by mouth daily after breakfast.     . thioridazine (MELLARIL) 25 MG tablet Take 50 mg by mouth 2 (two) times daily. Patient takes 2 tablets in the morning and 4 tablets at bedtime    . triamterene-hydrochlorothiazide (MAXZIDE-25) 37.5-25 MG per tablet Take 1 tablet by mouth every morning.    . Vitamin D, Ergocalciferol, (DRISDOL) 50000 UNITS CAPS capsule  Take 50,000 Units by mouth every 14 (fourteen) days.     No current facility-administered medications for this visit.    Allergies as of 06/16/2015  . (No Known Allergies)    Family History  Problem Relation Age of Onset  . Colon cancer      unknown    History   Social History  . Marital Status: Single    Spouse Name: N/A  . Number of Children: N/A  . Years of Education: N/A   Social History Main Topics  . Smoking status: Current Every Day Smoker  . Smokeless tobacco: Not on file     Comment: 4 cigarettes a day  . Alcohol Use: No  . Drug Use: No  . Sexual Activity: Not on file   Other Topics Concern  . None   Social History  Narrative    Review of Systems: Negative as mentioned in HPI  Physical Exam: BP 116/76 mmHg  Pulse 60  Temp(Src) 97.5 F (36.4 C) (Oral)  Ht 5\' 8"  (1.727 m)  Wt 178 lb (80.74 kg)  BMI 27.07 kg/m2 General:   Alert and oriented. No distress noted. Pleasant and cooperative.  Head:  Normocephalic and atraumatic Abdomen:  +BS, soft, non-tender and non-distended. No rebound or guarding. No HSM or masses noted. Extremities:  Without edema. Neurologic:  Alert and  oriented x4;  grossly normal neurologically. Psych:  Alert and cooperative. Normal mood and affect.

## 2015-06-16 NOTE — Patient Instructions (Signed)
Please complete blood work.   I will see you back in 6 weeks!  If any abdominal pain, nausea, vomiting, please call me.

## 2015-06-23 ENCOUNTER — Encounter: Payer: Self-pay | Admitting: Gastroenterology

## 2015-06-23 NOTE — Assessment & Plan Note (Signed)
Resolved. Gallbladder remains in situ. Retained gastric contents on EGD, with concern for underlying gastroparesis. N/V now resolved. If recurrent symptoms, proceed with GES. 20 lbs weight loss noted since Sept 2015. Per caregiver, dietary habits have changed to include healthier meal options. Will need to follow closely. Return in 6 weeks.

## 2015-06-23 NOTE — Assessment & Plan Note (Signed)
In setting of fatty liver. Last LFTs normal. Recheck LFTs now. Viral markers, autoimmune/PBC all normal.

## 2015-06-28 NOTE — Progress Notes (Signed)
cc'ed to pcp °

## 2015-07-06 DIAGNOSIS — B351 Tinea unguium: Secondary | ICD-10-CM | POA: Diagnosis not present

## 2015-07-06 DIAGNOSIS — I739 Peripheral vascular disease, unspecified: Secondary | ICD-10-CM | POA: Diagnosis not present

## 2015-07-06 DIAGNOSIS — L110 Acquired keratosis follicularis: Secondary | ICD-10-CM | POA: Diagnosis not present

## 2015-08-02 ENCOUNTER — Ambulatory Visit: Payer: Medicare Other | Admitting: Gastroenterology

## 2015-08-02 DIAGNOSIS — F209 Schizophrenia, unspecified: Secondary | ICD-10-CM | POA: Diagnosis not present

## 2015-08-17 ENCOUNTER — Encounter: Payer: Self-pay | Admitting: Gastroenterology

## 2015-08-17 ENCOUNTER — Ambulatory Visit (INDEPENDENT_AMBULATORY_CARE_PROVIDER_SITE_OTHER): Payer: Medicare Other | Admitting: Gastroenterology

## 2015-08-17 VITALS — BP 107/82 | HR 58 | Temp 97.4°F | Ht 66.0 in | Wt 175.0 lb

## 2015-08-17 DIAGNOSIS — R1013 Epigastric pain: Secondary | ICD-10-CM

## 2015-08-17 DIAGNOSIS — R945 Abnormal results of liver function studies: Secondary | ICD-10-CM

## 2015-08-17 DIAGNOSIS — R7989 Other specified abnormal findings of blood chemistry: Secondary | ICD-10-CM

## 2015-08-17 NOTE — Progress Notes (Signed)
Referring Provider: [name]., MD Primary Care Physician:  [name], MD Primary GI: [name]   Chief Complaint  Patient presents with  . Follow-up    HPI:   Kyle Wade is a 59 y.o. male presenting today with a history of dyspepsia to include abdominal pain, N/V, decreased appetite. EGD without significant findings but Schatzki's ring dilated due to history of dysphagia. Some retained gastric contents noted with recommendations for gastric emptying study but not completed as N/V resolved. Symptomatically improved at last visit in July 2016. Also notable history of fluctuating transaminases but also documentation of normalization. Viral markers negative, autoimmune/PBC work-up negative. [place] in OCt 2015 with fatty liver, cholelithiasis. Last LFTs normal in Sept 2015.   Caretaker present, saying he doesn't eat a lot during the day when staff isn't there. Patient will "smoke over eating". Food is baked, grilled, healthy. On Reglan four times a day, appears to be started by PCP. Weight 3 lbs down from July. CT May 2015 without acute findings.   Past Medical History  Diagnosis Date  . Hypertension   . Schizophrenia   . Mental retardation   . Chronic vomiting   . OSA (obstructive sleep apnea)     no cpap machine    Past Surgical History  Procedure Laterality Date  . None    . Colonoscopy N/A 09/21/2014    [name]: right-sided diverticulosis, internal hemorrhoids. Repeat in 10 years.   . Esophagogastroduodenoscopy N/A 09/21/2014    [name]: Subtle non-critical Schatzki's ring s/p 36 F dilation, query occult cervical esophageal web. Hiatal hernia. Question gastroparesis.   Gaspar Bidding dilation N/A 09/21/2014    Procedure: SAVORY DILATION;  Surgeon: [name], MD;  [facility];  Service: Endoscopy;  Laterality: N/A;  Elease Hashimoto dilation N/A 09/21/2014    Procedure: Elease Hashimoto DILATION;  Surgeon: [name], MD;  Location: [facility];  Service: Endoscopy;   Laterality: N/A;    Current Outpatient Prescriptions  Medication Sig Dispense Refill  . amLODipine (NORVASC) 5 MG tablet Take 5 mg by mouth every morning.    [name] buPROPion (WELLBUTRIN SR) 150 MG 12 hr tablet Take 150 mg by mouth 2 (two) times daily.    . cyclobenzaprine (FLEXERIL) 10 MG tablet Take 10 mg by mouth at bedtime.    . docusate sodium (COLACE) 100 MG capsule Take 100 mg by mouth 2 (two) times daily.    . famotidine (PEPCID) 40 MG tablet Take 40 mg by mouth at bedtime.    . fluticasone (FLONASE) 50 MCG/ACT nasal spray Place 2 sprays into both nostrils daily.    [name] gemfibrozil (LOPID) 600 MG tablet Take 600 mg by mouth 2 (two) times daily.    . metoCLOPramide (REGLAN) 5 MG tablet Take 5 mg by mouth 4 (four) times daily -  before meals and at bedtime.    . metoprolol (LOPRESSOR) 50 MG tablet Take 50 mg by mouth 2 (two) times daily.    [name] omeprazole (PRILOSEC) 20 MG capsule Take 20 mg by mouth daily before breakfast.    . tamsulosin (FLOMAX) 0.4 MG CAPS capsule Take 0.4 mg by mouth daily after breakfast.     . thioridazine (MELLARIL) 25 MG tablet Take 50 mg by mouth 2 (two) times daily. Patient takes 2 tablets in the morning and 4 tablets at bedtime    . triamterene-hydrochlorothiazide (MAXZIDE-25) 37.5-25 MG per tablet Take 1 tablet by mouth every morning.    [name]  Vitamin D, Ergocalciferol, (DRISDOL) 50000 UNITS CAPS capsule Take 50,000 Units by mouth every 14 (fourteen) days.    [name] oxybutynin (DITROPAN) 5 MG tablet Take 5 mg by mouth every morning.     No current facility-administered medications for this visit.    Allergies as of 08/17/2015  . (No Known Allergies)    Family History  Problem Relation Age of Onset  . Colon cancer      unknown    Social History   Social History  . Marital Status: Single    Spouse Name: N/A  . Number of Children: N/A  . Years of Education: N/A   Social History Main Topics  . Smoking status: Current Every Day Smoker  . Smokeless tobacco: None      Comment: 4 cigarettes a day  . Alcohol Use: No  . Drug Use: No  . Sexual Activity: Not Asked   Other Topics Concern  . None   Social History Narrative    Review of Systems: Unable to obtain due to cognitive status.   Physical Exam: BP 107/82 mmHg  Pulse 58  Temp(Src) 97.4 F (36.3 C)  Ht  (1.676 m)  Wt 175 lb (79.379 kg)  BMI 28.26 kg/m2 General:   Alert and oriented. No distress noted. Pleasant and cooperative.  Head:  Normocephalic and atraumatic. Eyes:  Conjuctiva clear without scleral icterus. Abdomen:  +BS, soft, non-tender and non-distended. No rebound or guarding. No HSM or masses noted. Msk:  Symmetrical without gross deformities. Normal posture. Extremities:  Without edema. Neurologic:  Alert and  oriented x4;  grossly normal neurologically. Psych:  Alert and cooperative. Normal mood and affect.  Lab Results  Component Value Date   ALT 21 09/02/2014   AST 24 09/02/2014   ALKPHOS 91 09/02/2014   BILITOT 0.6 09/02/2014

## 2015-08-17 NOTE — Patient Instructions (Signed)
Please have the blood tests faxed to our office.   I would like to see you back in 2 months!

## 2015-08-21 NOTE — Assessment & Plan Note (Addendum)
In setting of fatty liver. Last LFTs normal. Need repeat labs now.    Addendum 10/26: received outside labs dated June 2016. AST ALT normal at 23, 24 respectively. Tbili 0.2. Alk phos 73. Albumin 4.3.

## 2015-08-21 NOTE — Assessment & Plan Note (Signed)
Resolved. Likely gastroparesis but GES not completed. However, symptomatically improved and appears Reglan started by another provider. Several pounds down from last visit but without further N/V concerning signs. Return in 2 months. Low threshold for repeat abdominal imaging (via CT) if persistent weight loss without cause.

## 2015-08-22 NOTE — Progress Notes (Signed)
CC'ED TO PCP 

## 2015-10-17 ENCOUNTER — Encounter: Payer: Self-pay | Admitting: Gastroenterology

## 2015-10-17 ENCOUNTER — Ambulatory Visit (INDEPENDENT_AMBULATORY_CARE_PROVIDER_SITE_OTHER): Payer: Medicare Other | Admitting: Gastroenterology

## 2015-10-17 VITALS — BP 125/81 | HR 50 | Temp 98.4°F | Ht 67.0 in | Wt 170.8 lb

## 2015-10-17 DIAGNOSIS — R945 Abnormal results of liver function studies: Secondary | ICD-10-CM

## 2015-10-17 DIAGNOSIS — R7989 Other specified abnormal findings of blood chemistry: Secondary | ICD-10-CM

## 2015-10-17 DIAGNOSIS — R799 Abnormal finding of blood chemistry, unspecified: Secondary | ICD-10-CM | POA: Diagnosis not present

## 2015-10-17 DIAGNOSIS — R2689 Other abnormalities of gait and mobility: Secondary | ICD-10-CM | POA: Diagnosis not present

## 2015-10-17 DIAGNOSIS — R634 Abnormal weight loss: Secondary | ICD-10-CM | POA: Diagnosis not present

## 2015-10-17 MED ORDER — ONDANSETRON HCL 4 MG PO TABS: 4.0000 mg | ORAL_TABLET | Freq: Three times a day (TID) | ORAL | Status: DC | PRN

## 2015-10-17 NOTE — Progress Notes (Signed)
Referring Provider: [name]., MD Primary Care Physician:  [name], MD  Primary GI:  [name]   Chief Complaint  Patient presents with  . Follow-up    HPI:   Kyle Wade is a 59 y.o. male presenting today with a history of dyspepsia to include abdominal pain, N/V, decreased appetite. EGD without significant findings but Schatzki's ring dilated due to history of dysphagia. Some retained gastric contents noted with recommendations for gastric emptying study but not completed as N/V resolved. Symptomatically improved at last visit in July 2016. Also notable history of fluctuating transaminases but also documentation of normalization. Viral markers negative, autoimmune/PBC work-up negative. [place] in OCt 2015 with fatty liver, cholelithiasis. Last LFTs normal in Sept 2015 and again in June 2016.   Reglan QID, appears started by another provider, possibly PCP. CT May 2015 without acute findings. Weight down another 5 lbs from Sept 2016. He has lost close to 30 lbs in the last year. Here for weight check.   Caretaker states patient is eating "all the time". No constipation or diarrhea. Labs with [name] a few months ago. No vomiting. Has been off balance. Stumbling sidewise the other day at church. Wants to sleep a lot. Notes headaches, feels "fuzzy" at times. Sometimes blurred vision.   Past Medical History  Diagnosis Date  . Hypertension   . Schizophrenia (HCC)   . Mental retardation   . Chronic vomiting   . OSA (obstructive sleep apnea)     no cpap machine    Past Surgical History  Procedure Laterality Date  . None    . Colonoscopy N/A 09/21/2014    [name]: right-sided diverticulosis, internal hemorrhoids. Repeat in 10 years.   . Esophagogastroduodenoscopy N/A 09/21/2014    [name]: Subtle non-critical Schatzki's ring s/p 36 F dilation, query occult cervical esophageal web. Hiatal hernia. Question gastroparesis.   Gaspar Bidding dilation N/A 09/21/2014    Procedure:  SAVORY DILATION;  Surgeon: [name], MD;  [facility]: [facility];  Service: Endoscopy;  Laterality: N/A;  Elease Hashimoto dilation N/A 09/21/2014    Procedure: Elease Hashimoto DILATION;  Surgeon: [name], MD;  Location: [facility];  Service: Endoscopy;  Laterality: N/A;    Current Outpatient Prescriptions  Medication Sig Dispense Refill  . amLODipine (NORVASC) 5 MG tablet Take 5 mg by mouth every morning.    [name] buPROPion (WELLBUTRIN SR) 150 MG 12 hr tablet Take 150 mg by mouth 2 (two) times daily.    . cyclobenzaprine (FLEXERIL) 10 MG tablet Take 10 mg by mouth at bedtime.    . docusate sodium (COLACE) 100 MG capsule Take 100 mg by mouth 2 (two) times daily.    . famotidine (PEPCID) 40 MG tablet Take 40 mg by mouth at bedtime.    . fluticasone (FLONASE) 50 MCG/ACT nasal spray Place 2 sprays into both nostrils daily.    [name] gemfibrozil (LOPID) 600 MG tablet Take 600 mg by mouth 2 (two) times daily.    . metoCLOPramide (REGLAN) 5 MG tablet Take 5 mg by mouth 4 (four) times daily -  before meals and at bedtime.    . metoprolol (LOPRESSOR) 50 MG tablet Take 50 mg by mouth 2 (two) times daily.    [name] omeprazole (PRILOSEC) 20 MG capsule Take 20 mg by mouth daily before breakfast.    . oxybutynin (DITROPAN) 5 MG tablet Take 5 mg by mouth every morning.    . potassium chloride (MICRO-K) 10  MEQ CR capsule Take 10 mEq by mouth 2 (two) times daily.    . tamsulosin (FLOMAX) 0.4 MG CAPS capsule Take 0.4 mg by mouth daily after breakfast.     . thioridazine (MELLARIL) 25 MG tablet Take 50 mg by mouth 2 (two) times daily. Patient takes 2 tablets in the morning and 4 tablets at bedtime    . triamterene-hydrochlorothiazide (MAXZIDE-25) 37.5-25 MG per tablet Take 1 tablet by mouth every morning.    . Vitamin D, Ergocalciferol, (DRISDOL) 50000 UNITS CAPS capsule Take 50,000 Units by mouth every 14 (fourteen) days.     No current facility-administered medications for this visit.    Allergies as of 10/17/2015  .  (No Known Allergies)    Family History  Problem Relation Age of Onset  . Colon cancer      unknown    Social History   Social History  . Marital Status: Single    Spouse Name: N/A  . Number of Children: N/A  . Years of Education: N/A   Social History Main Topics  . Smoking status: Current Every Day Smoker  . Smokeless tobacco: None     Comment: 4 cigarettes a day  . Alcohol Use: No  . Drug Use: No  . Sexual Activity: Not Asked   Other Topics Concern  . None   Social History Narrative    Review of Systems: As mentioned in HPI   Physical Exam: BP 125/81 mmHg  Pulse 50  Temp(Src) 98.4 F (36.9 C) (Oral)  Ht  (1.702 m)  Wt 170 lb 12.8 oz (77.474 kg)  BMI 26.74 kg/m2 General:   Alert and oriented. No distress noted. Pleasant and cooperative.  Eyes:  Conjuctiva clear without scleral icterus. Mouth:  Oral mucosa pink and moist. Good dentition. No lesions. Abdomen:  +BS, soft, non-tender and non-distended. No rebound or guarding. No HSM or masses noted. Msk:  Symmetrical without gross deformities. Normal posture. Extremities:  Without edema. Bilateral hand strength equal Neurologic:  Alert and  oriented x4 Psych:  Alert and cooperative. Normal mood and affect.

## 2015-10-17 NOTE — Patient Instructions (Signed)
STOP REGLAN.   I have sent Zofran to the pharmacy to take as needed for nausea.   I have ordered a CT of his head.   Please do lab work now. We may need additional work-up once this is done.

## 2015-10-18 ENCOUNTER — Other Ambulatory Visit: Payer: Self-pay

## 2015-10-18 DIAGNOSIS — R42 Dizziness and giddiness: Secondary | ICD-10-CM

## 2015-10-18 DIAGNOSIS — R2689 Other abnormalities of gait and mobility: Secondary | ICD-10-CM

## 2015-10-19 LAB — COMPLETE METABOLIC PANEL WITH GFR
ALT: 12 U/L (ref 9–46)
AST: 18 U/L (ref 10–35)
Albumin: 4.1 g/dL (ref 3.6–5.1)
Alkaline Phosphatase: 81 U/L (ref 40–115)
BUN: 18 mg/dL (ref 7–25)
CALCIUM: 9.2 mg/dL (ref 8.6–10.3)
CO2: 31 mmol/L (ref 20–31)
CREATININE: 1.18 mg/dL (ref 0.70–1.33)
Chloride: 100 mmol/L (ref 98–110)
GFR, Est African American: 78 mL/min (ref >=60)
GFR, Est Non African American: 67 mL/min (ref >=60)
GLUCOSE: 81 mg/dL (ref 65–99)
POTASSIUM: 3.8 mmol/L (ref 3.5–5.3)
SODIUM: 136 mmol/L (ref 135–146)
Total Bilirubin: 0.4 mg/dL (ref 0.2–1.2)
Total Protein: 6.6 g/dL (ref 6.1–8.1)

## 2015-10-19 LAB — CBC
HEMATOCRIT: 40.9 % (ref 39.0–52.0)
Hemoglobin: 14.4 g/dL (ref 13.0–17.0)
MCH: 31 pg (ref 26.0–34.0)
MCHC: 35.2 g/dL (ref 30.0–36.0)
MCV: 88 fL (ref 78.0–100.0)
MPV: 11.4 fL (ref 8.6–12.4)
Platelets: 158 10*3/uL (ref 150–400)
RBC: 4.65 MIL/uL (ref 4.22–5.81)
RDW: 12.5 % (ref 11.5–15.5)
WBC: 3.4 10*3/uL — AB (ref 4.0–10.5)

## 2015-10-19 LAB — IRON AND TIBC
%SAT: 40 % (ref 15–60)
Iron: 111 ug/dL (ref 50–180)
TIBC: 281 ug/dL (ref 250–425)
UIBC: 170 ug/dL (ref 125–400)

## 2015-10-19 LAB — FERRITIN: Ferritin: 401 ng/mL — ABNORMAL HIGH (ref 22–322)

## 2015-10-19 LAB — TSH: TSH: 0.911 u[IU]/mL (ref 0.350–4.500)

## 2015-10-20 ENCOUNTER — Ambulatory Visit (HOSPITAL_COMMUNITY): Payer: Medicare Other

## 2015-10-20 LAB — HIV ANTIBODY (ROUTINE TESTING W REFLEX): HIV: NONREACTIVE

## 2015-10-23 NOTE — Assessment & Plan Note (Signed)
59 year old male with persistent weight loss noted, close to 30lbs in the last year. CT May 2015 without acute findings. No loss of appetite, nausea, or vomiting. TCS/EGD on file from Oct 2015. Worrisome symptoms to include imbalance, stumbling, headaches, dizziness. Has been on Reglan, started by another provider, for high suspicion of gastroparesis. Will do the following:  1. STOP REGLAN in case of any adverse side effects 2. Check TSH, CBC, CMP, iron studies, HIV 3. CT head due to concerning signs 4. Consider CT abdomen for evaluation of occult malignancy

## 2015-10-24 NOTE — Assessment & Plan Note (Signed)
Unclear etiology. Work-up as outlined.

## 2015-10-25 NOTE — Progress Notes (Signed)
cc'ed to pcp °

## 2015-10-29 NOTE — Progress Notes (Signed)
Quick Note:  HIV nonreactive. TSH is normal. Ferritin remains elevated, unclear why. LFTs are normal. No anemia. Patient did not complete CT head. Is he still dizzy, unsteady? If STOPPING Reglan helped that, I may consider holding off on CT head. However, if he is still having symptoms, needs to have it completed. Please order a transferrin level as well. Ferritin remains elevated, unclear why. ______

## 2015-11-07 ENCOUNTER — Other Ambulatory Visit: Payer: Self-pay

## 2015-11-07 ENCOUNTER — Other Ambulatory Visit: Payer: Self-pay | Admitting: Gastroenterology

## 2015-11-07 DIAGNOSIS — R7989 Other specified abnormal findings of blood chemistry: Secondary | ICD-10-CM

## 2015-11-22 DIAGNOSIS — B351 Tinea unguium: Secondary | ICD-10-CM | POA: Diagnosis not present

## 2015-11-22 DIAGNOSIS — I739 Peripheral vascular disease, unspecified: Secondary | ICD-10-CM | POA: Diagnosis not present

## 2015-11-22 DIAGNOSIS — L110 Acquired keratosis follicularis: Secondary | ICD-10-CM | POA: Diagnosis not present

## 2015-11-29 DIAGNOSIS — F209 Schizophrenia, unspecified: Secondary | ICD-10-CM | POA: Diagnosis not present

## 2015-12-05 ENCOUNTER — Ambulatory Visit (HOSPITAL_COMMUNITY)
Admission: RE | Admit: 2015-12-05 | Discharge: 2015-12-05 | Disposition: A | Discharge status: Home / Self Care | Payer: Medicare Other | Source: Ambulatory Visit | Attending: Gastroenterology | Admitting: Gastroenterology

## 2015-12-05 DIAGNOSIS — R2689 Other abnormalities of gait and mobility: Secondary | ICD-10-CM | POA: Diagnosis not present

## 2015-12-05 DIAGNOSIS — B20 Human immunodeficiency virus [HIV] disease: Secondary | ICD-10-CM | POA: Insufficient documentation

## 2015-12-05 DIAGNOSIS — R51 Headache: Secondary | ICD-10-CM | POA: Diagnosis not present

## 2015-12-05 DIAGNOSIS — H538 Other visual disturbances: Secondary | ICD-10-CM | POA: Diagnosis not present

## 2015-12-05 DIAGNOSIS — R42 Dizziness and giddiness: Secondary | ICD-10-CM | POA: Diagnosis not present

## 2015-12-06 DIAGNOSIS — I10 Essential (primary) hypertension: Secondary | ICD-10-CM | POA: Diagnosis not present

## 2015-12-06 DIAGNOSIS — R634 Abnormal weight loss: Secondary | ICD-10-CM | POA: Diagnosis not present

## 2015-12-06 DIAGNOSIS — K219 Gastro-esophageal reflux disease without esophagitis: Secondary | ICD-10-CM | POA: Diagnosis not present

## 2015-12-14 ENCOUNTER — Encounter: Payer: Self-pay | Admitting: Internal Medicine

## 2015-12-14 ENCOUNTER — Other Ambulatory Visit: Payer: Self-pay | Admitting: Gastroenterology

## 2015-12-14 ENCOUNTER — Other Ambulatory Visit: Payer: Self-pay

## 2015-12-14 DIAGNOSIS — R7989 Other specified abnormal findings of blood chemistry: Secondary | ICD-10-CM

## 2015-12-14 NOTE — Progress Notes (Signed)
Quick Note:  CT head normal. Needs to complete transferrin and iron, which I believe was sent to [facility] to do. Let's have him return non-urgently to see me. Needs weight check and further evaluation for elevated ferritin. ______

## 2015-12-14 NOTE — Progress Notes (Signed)
APPT MADE AND LETTER SENT  °

## 2015-12-29 DIAGNOSIS — R7989 Other specified abnormal findings of blood chemistry: Secondary | ICD-10-CM | POA: Diagnosis not present

## 2015-12-29 DIAGNOSIS — R799 Abnormal finding of blood chemistry, unspecified: Secondary | ICD-10-CM | POA: Diagnosis not present

## 2015-12-30 LAB — IRON: Iron: 118 ug/dL (ref 50–180)

## 2016-01-01 LAB — TRANSFERRIN: TRANSFERRIN: 268 mg/dL (ref 188–341)

## 2016-01-03 ENCOUNTER — Encounter: Payer: Self-pay | Admitting: Gastroenterology

## 2016-01-03 ENCOUNTER — Ambulatory Visit (INDEPENDENT_AMBULATORY_CARE_PROVIDER_SITE_OTHER): Payer: Medicare Other | Admitting: Gastroenterology

## 2016-01-03 VITALS — BP 125/80 | HR 68 | Temp 97.2°F | Ht 65.0 in | Wt 165.6 lb

## 2016-01-03 DIAGNOSIS — R112 Nausea with vomiting, unspecified: Secondary | ICD-10-CM

## 2016-01-03 DIAGNOSIS — R634 Abnormal weight loss: Secondary | ICD-10-CM | POA: Diagnosis not present

## 2016-01-03 NOTE — Patient Instructions (Signed)
We have scheduled you for a special CT to evaluate the vessels in your stomach.   I have also ordered a gastric emptying study. If this is positive for delayed gastric emptying, I will restart Reglan.   I would like to see you in 2 weeks.

## 2016-01-03 NOTE — Progress Notes (Signed)
Referring Provider: [name]., MD Primary Care Physician:  [name], MD  Primary GI: [name]   Chief Complaint  Patient presents with  . Follow-up    HPI:   Kyle Wade is a 60 y.o. male presenting today with a history of dyspepsia to include abdominal pain, N/V, decreased appetite. EGD without significant findings but Schatzki's ring dilated due to history of dysphagia. Some retained gastric contents noted. Reglan started by PCP it appears. However, I stopped this at his last visit due to vague symptoms of dizziness, feeling "fuzzy". Metoprolol has been adjusted.  Also notable history of fluctuating transaminases but also documentation of normalization. Viral markers negative, autoimmune/PBC work-up negative. [place] in Oct 2015 with fatty liver, cholelithiasis. CT May 2015 without acute findings. Head CT normal. Has been followed due to weight loss. Recent labs with normal TSH, HIV negative, LFTs normal, no anemia. Ferritin elevated at 401, mildly. Iron 118, transferrin 268. TSAT 40. Doubt iron overload.   No vomiting this week. Weight continues to decrease. Eating all the time. Sometimes when he chews gum "it hurts my stomach". Sometimes throwing up. Caretaker states he has been vomiting at times. Was still dizzy off Reglan. Problems urinating. Sees urologist every 6 months. Caretakers state he complains of pain after eating.   Past Medical History  Diagnosis Date  . Hypertension   . Schizophrenia (HCC)   . Mental retardation   . Chronic vomiting   . OSA (obstructive sleep apnea)     no cpap machine    Past Surgical History  Procedure Laterality Date  . None    . Colonoscopy N/A 09/21/2014    [name]: right-sided diverticulosis, internal hemorrhoids. Repeat in 10 years.   . Esophagogastroduodenoscopy N/A 09/21/2014    [name]: Subtle non-critical Schatzki's ring s/p 59 F dilation, query occult cervical esophageal web. Hiatal hernia. Question gastroparesis.   Gaspar Bidding dilation N/A 09/21/2014    Procedure: SAVORY DILATION;  Surgeon: [name], MD;  [facility]: [facility];  Service: Endoscopy;  Laterality: N/A;  Elease Hashimoto dilation N/A 09/21/2014    Procedure: Elease Hashimoto DILATION;  Surgeon: [name], MD;  [facility];  Service: Endoscopy;  Laterality: N/A;    Current Outpatient Prescriptions  Medication Sig Dispense Refill  . amLODipine (NORVASC) 5 MG tablet Take 5 mg by mouth every morning.    [name] buPROPion (WELLBUTRIN SR) 150 MG 12 hr tablet Take 150 mg by mouth 2 (two) times daily.    . cyclobenzaprine (FLEXERIL) 10 MG tablet Take 10 mg by mouth at bedtime.    . docusate sodium (COLACE) 100 MG capsule Take 100 mg by mouth 2 (two) times daily.    . famotidine (PEPCID) 40 MG tablet Take 40 mg by mouth at bedtime.    . fluticasone (FLONASE) 50 MCG/ACT nasal spray Place 2 sprays into both nostrils daily.    [name] gemfibrozil (LOPID) 600 MG tablet Take 600 mg by mouth 2 (two) times daily.    . metoprolol (LOPRESSOR) 50 MG tablet Take 25 mg by mouth 2 (two) times daily.     [name] omeprazole (PRILOSEC) 20 MG capsule Take 20 mg by mouth daily before breakfast.    . ondansetron (ZOFRAN) 4 MG tablet Take 1 tablet (4 mg total) by mouth every 8 (eight) hours as needed for nausea or vomiting. 30 tablet 1  . oxybutynin (DITROPAN) 5 MG tablet Take 5 mg by mouth every morning.    [name]  potassium chloride (MICRO-K) 10 MEQ CR capsule Take 10 mEq by mouth 2 (two) times daily.    . tamsulosin (FLOMAX) 0.4 MG CAPS capsule Take 0.4 mg by mouth daily after breakfast.     . thioridazine (MELLARIL) 25 MG tablet Take 50 mg by mouth 2 (two) times daily. Patient takes 2 tablets in the morning and 4 tablets at bedtime    . triamterene-hydrochlorothiazide (MAXZIDE-25) 37.5-25 MG per tablet Take 1 tablet by mouth every morning.    . Vitamin D, Ergocalciferol, (DRISDOL) 50000 UNITS CAPS capsule Take 50,000 Units by mouth every 14 (fourteen) days.     No current  facility-administered medications for this visit.    Allergies as of 01/03/2016  . (No Known Allergies)    Family History  Problem Relation Age of Onset  . Colon cancer      unknown    Social History   Social History  . Marital Status: Single    Spouse Name: N/A  . Number of Children: N/A  . Years of Education: N/A   Social History Main Topics  . Smoking status: Current Every Day Smoker  . Smokeless tobacco: None     Comment: 4 cigarettes a day  . Alcohol Use: No  . Drug Use: No  . Sexual Activity: Not Asked   Other Topics Concern  . None   Social History Narrative    Review of Systems: As mentioned in HPI   Physical Exam: BP 125/80 mmHg  Pulse 68  Temp(Src) 97.2 F (36.2 C) (Oral)  Ht  (1.651 m)  Wt 165 lb 9.6 oz (75.116 kg)  BMI 27.56 kg/m2 General:   Alert and oriented. No distress noted. Pleasant and cooperative.  Head:  Normocephalic and atraumatic. Eyes:  Conjuctiva clear without scleral icterus. Mouth:  Oral mucosa pink and moist.  Abdomen:  +BS, soft, non-tender and non-distended. No rebound or guarding. No HSM or masses noted. Msk:  Symmetrical without gross deformities. Normal posture. Extremities:  Without edema. Neurologic:  Alert and  oriented x4 Psych:  Alert and cooperative. Normal mood and affect.

## 2016-01-05 ENCOUNTER — Other Ambulatory Visit: Payer: Self-pay | Admitting: Internal Medicine

## 2016-01-05 ENCOUNTER — Other Ambulatory Visit: Payer: Self-pay

## 2016-01-05 DIAGNOSIS — K551 Chronic vascular disorders of intestine: Secondary | ICD-10-CM

## 2016-01-05 DIAGNOSIS — R112 Nausea with vomiting, unspecified: Secondary | ICD-10-CM

## 2016-01-05 DIAGNOSIS — R110 Nausea: Secondary | ICD-10-CM

## 2016-01-05 DIAGNOSIS — R634 Abnormal weight loss: Secondary | ICD-10-CM

## 2016-01-05 NOTE — Assessment & Plan Note (Addendum)
60 year old male with a slow, persistent weight loss. I'm quite concerned about this. TCS/EGD on file from Oct 2015. CT head negative (performed due to headaches, dizziness, imbalance). Even after stopping Reglan, these symptoms continued. However, these symptoms are better. Labs to include TSH, CBC, CMP, HIV normal. Ferritin was elevated at 401, with iron 118, transferrin 268, TSAT 40. Doubt iron overload. He is mentally challenged, so obtaining a history is somewhat difficult. Caretakers have mentioned abdominal pain after eating. As this is coupled with weight loss, will pursue a CTA to rule out mesenteric ischemia. CT May 2015 without acute findings, and [place] in Oct 2015 with fatty liver and gallstones. Will also pursue GES and IF positive, I would like to restart Reglan. N/V could very well be related to delayed gastric emptying. Return in 2 weeks for close follow-up.

## 2016-01-05 NOTE — Progress Notes (Signed)
Talked with Kyle Wade and she is aware of appointment for GES (01/08/16 @ 8:00) NO PA is needed.

## 2016-01-08 ENCOUNTER — Other Ambulatory Visit: Payer: Self-pay

## 2016-01-08 ENCOUNTER — Ambulatory Visit (HOSPITAL_COMMUNITY)
Admission: RE | Admit: 2016-01-08 | Discharge: 2016-01-08 | Disposition: A | Discharge status: Home / Self Care | Payer: Medicare Other | Source: Ambulatory Visit | Attending: Internal Medicine | Admitting: Internal Medicine

## 2016-01-08 ENCOUNTER — Encounter (HOSPITAL_COMMUNITY): Payer: Self-pay

## 2016-01-08 ENCOUNTER — Other Ambulatory Visit: Payer: Self-pay | Admitting: Internal Medicine

## 2016-01-08 DIAGNOSIS — R110 Nausea: Secondary | ICD-10-CM

## 2016-01-08 DIAGNOSIS — I10 Essential (primary) hypertension: Secondary | ICD-10-CM | POA: Insufficient documentation

## 2016-01-08 DIAGNOSIS — R112 Nausea with vomiting, unspecified: Secondary | ICD-10-CM

## 2016-01-08 DIAGNOSIS — R634 Abnormal weight loss: Secondary | ICD-10-CM | POA: Diagnosis not present

## 2016-01-08 MED ORDER — TECHNETIUM TC 99M SULFUR COLLOID
2.0000 | Freq: Once | INTRAVENOUS | Status: AC | PRN
  Administered 2016-01-08: 2 via ORAL

## 2016-01-09 NOTE — Progress Notes (Signed)
CC'ED TO PCP 

## 2016-01-12 ENCOUNTER — Encounter (HOSPITAL_COMMUNITY): Payer: Self-pay

## 2016-01-12 ENCOUNTER — Encounter (HOSPITAL_COMMUNITY)
Admission: RE | Admit: 2016-01-12 | Discharge: 2016-01-12 | Disposition: A | Discharge status: Home / Self Care | Payer: Medicare Other | Source: Ambulatory Visit | Attending: Internal Medicine | Admitting: Internal Medicine

## 2016-01-12 DIAGNOSIS — R634 Abnormal weight loss: Secondary | ICD-10-CM | POA: Diagnosis not present

## 2016-01-12 DIAGNOSIS — R110 Nausea: Secondary | ICD-10-CM | POA: Insufficient documentation

## 2016-01-12 DIAGNOSIS — R112 Nausea with vomiting, unspecified: Secondary | ICD-10-CM | POA: Diagnosis not present

## 2016-01-12 MED ORDER — TECHNETIUM TC 99M SULFUR COLLOID
2.0000 | Freq: Once | INTRAVENOUS | Status: AC | PRN
  Administered 2016-01-12: 2 via ORAL

## 2016-01-18 NOTE — Progress Notes (Signed)
Quick Note:  GES incomplete. Patient did not return for imaging. Let's have this rescheduled. ______

## 2016-01-19 ENCOUNTER — Other Ambulatory Visit: Payer: Self-pay

## 2016-01-19 DIAGNOSIS — K551 Chronic vascular disorders of intestine: Secondary | ICD-10-CM

## 2016-01-22 ENCOUNTER — Ambulatory Visit (HOSPITAL_COMMUNITY): Payer: Medicare Other

## 2016-01-24 ENCOUNTER — Ambulatory Visit (INDEPENDENT_AMBULATORY_CARE_PROVIDER_SITE_OTHER): Payer: Medicare Other | Admitting: Gastroenterology

## 2016-01-24 ENCOUNTER — Encounter: Payer: Self-pay | Admitting: Gastroenterology

## 2016-01-24 ENCOUNTER — Other Ambulatory Visit: Payer: Self-pay

## 2016-01-24 ENCOUNTER — Encounter: Payer: Self-pay | Admitting: Internal Medicine

## 2016-01-24 VITALS — BP 120/77 | HR 61 | Temp 98.2°F | Ht 68.0 in | Wt 166.8 lb

## 2016-01-24 DIAGNOSIS — R634 Abnormal weight loss: Secondary | ICD-10-CM | POA: Diagnosis not present

## 2016-01-24 NOTE — Progress Notes (Signed)
Referring Provider: [name]., MD Primary Care Physician:  [name], MD  Primary GI: [name]   Chief Complaint  Patient presents with  . Follow-up    HPI:   Kyle Wade is a 60 y.o. male presenting today with a history of dyspepsia to include abdominal pain, N/V, decreased appetite. EGD without significant findings but Schatzki's ring dilated due to history of dysphagia. Some retained gastric contents noted. Reglan started by PCP in past. However, I stopped this at his last visit due to vague symptoms of dizziness, feeling "fuzzy". Metoprolol has been adjusted. Also notable history of fluctuating transaminases but also documentation of normalization. Viral markers negative, autoimmune/PBC work-up negative. [place] in Oct 2015 with fatty liver, cholelithiasis. CT May 2015 without acute findings. Head CT normal. Has been followed due to weight loss. Recent labs with normal TSH, HIV negative, LFTs normal, no anemia. Ferritin elevated at 401, mildly. Iron 118, transferrin 268. TSAT 40. Doubt iron overload.  At last visit, GES arranged. This was normal. CTA is ordered for tomorrow to assess for mesenteric ischemia. Fortunately, he is up 1 pound today. Highest weight 199 in May 2015. In July 2016 was 178, getting to a low of 165 as of last visit Jan 03, 2016.   Stomach hurts with chewing gum. Over the weekend, abdomen hurt so bad he fell to the floor. No pain right now at time of appointment. No diarrhea. No constipation. Patient told caretaker he is afraid to eat because he is afraid his stomach will hurt.   Past Medical History  Diagnosis Date  . Hypertension   . Schizophrenia (HCC)   . Mental retardation   . Chronic vomiting   . OSA (obstructive sleep apnea)     no cpap machine    Past Surgical History  Procedure Laterality Date  . None    . Colonoscopy N/A 09/21/2014    [name]: right-sided diverticulosis, internal hemorrhoids. Repeat in 10 years.   .  Esophagogastroduodenoscopy N/A 09/21/2014    [name]: Subtle non-critical Schatzki's ring s/p 50 F dilation, query occult cervical esophageal web. Hiatal hernia. Question gastroparesis.   Gaspar Bidding dilation N/A 09/21/2014    Procedure: SAVORY DILATION;  Surgeon: [name], MD;  [facility]: [facility];  Service: Endoscopy;  Laterality: N/A;  Elease Hashimoto dilation N/A 09/21/2014    Procedure: Elease Hashimoto DILATION;  Surgeon: [name], MD;  [facility]: [facility];  Service: Endoscopy;  Laterality: N/A;    Current Outpatient Prescriptions  Medication Sig Dispense Refill  . amLODipine (NORVASC) 5 MG tablet Take 5 mg by mouth every morning.    Kyle Wade Kitchen buPROPion (WELLBUTRIN SR) 150 MG 12 hr tablet Take 150 mg by mouth 2 (two) times daily.    . cyclobenzaprine (FLEXERIL) 10 MG tablet Take 10 mg by mouth at bedtime.    . docusate sodium (COLACE) 100 MG capsule Take 100 mg by mouth 2 (two) times daily.    . famotidine (PEPCID) 40 MG tablet Take 40 mg by mouth at bedtime.    . fluticasone (FLONASE) 50 MCG/ACT nasal spray Place 2 sprays into both nostrils daily.    Kyle Wade Kitchen gemfibrozil (LOPID) 600 MG tablet Take 600 mg by mouth 2 (two) times daily.    . metoprolol (LOPRESSOR) 50 MG tablet Take 25 mg by mouth 2 (two) times daily.     Kyle Wade Kitchen omeprazole (PRILOSEC) 20 MG capsule Take 20 mg by mouth daily before breakfast.    . ondansetron (  ZOFRAN) 4 MG tablet Take 1 tablet (4 mg total) by mouth every 8 (eight) hours as needed for nausea or vomiting. 30 tablet 1  . oxybutynin (DITROPAN) 5 MG tablet Take 5 mg by mouth every morning.    . potassium chloride (MICRO-K) 10 MEQ CR capsule Take 10 mEq by mouth 2 (two) times daily.    . tamsulosin (FLOMAX) 0.4 MG CAPS capsule Take 0.4 mg by mouth daily after breakfast.     . thioridazine (MELLARIL) 25 MG tablet Take 50 mg by mouth 2 (two) times daily. Patient takes 2 tablets in the morning and 4 tablets at bedtime    . triamterene-hydrochlorothiazide (MAXZIDE-25) 37.5-25 MG per  tablet Take 1 tablet by mouth every morning.    . Vitamin D, Ergocalciferol, (DRISDOL) 50000 UNITS CAPS capsule Take 50,000 Units by mouth every 14 (fourteen) days.     No current facility-administered medications for this visit.    Allergies as of 01/24/2016  . (No Known Allergies)    Family History  Problem Relation Age of Onset  . Colon cancer      unknown    Social History   Social History  . Marital Status: Single    Spouse Name: N/A  . Number of Children: N/A  . Years of Education: N/A   Social History Main Topics  . Smoking status: Current Every Day Smoker  . Smokeless tobacco: None     Comment: 4 cigarettes a day  . Alcohol Use: No  . Drug Use: No  . Sexual Activity: Not Asked   Other Topics Concern  . None   Social History Narrative    Review of Systems: As mentioned in HPI   Physical Exam: BP 120/77 mmHg  Pulse 61  Temp(Src) 98.2 F (36.8 C) (Oral)  Ht  (1.727 m)  Wt 166 lb 12.8 oz (75.66 kg)  BMI 25.37 kg/m2 General:   Alert and oriented. No distress noted. Pleasant and cooperative.  Head:  Normocephalic and atraumatic. Abdomen:  +BS, soft, non-tender and non-distended. No rebound or guarding. No HSM or masses noted.  Msk:  Symmetrical without gross deformities. Normal posture. Extremities:  Without edema. Neurologic:  Alert and  oriented x4 Psych:  Alert and cooperative. Normal mood and affect.

## 2016-01-24 NOTE — Patient Instructions (Signed)
I would like to get a HIDA scan done today if possible. If not, we will do it in the near future.  I am very interested to see what your CT scan shows tomorrow.   Once we see the results of both of these tests, we will be able to decide where you will need to be referred. If you have disease of your vessels in your stomach, we will refer you to [facility]. If your gallbladder is sick, we will refer you to a surgeon. If all of these are normal, I will refer you to [facility] for a second opinion.

## 2016-01-24 NOTE — Assessment & Plan Note (Signed)
60 year old male with slow, persistent weight loss. This has been quite concerning. TCS/EGD on file from Oct 2015. GES normal. TSH, CBC, CMP, HIV normal. Ferritin was non-specifically elevated at 401, but TSAT was 40 so less likely iron overload. He is mentally challenged, so this has been an extremely challenging case. He has vague abdominal pain that does not appear to be associated with bowel habits. He has had a fear of eating, and I have already ordered a CTA, which will actually be performed tomorrow. [place] in Oct 2015 with fatty liver and gallstones. I am also ordering a HIDA to rule out biliary dyskinesia. However, I am most interested in the CTA, to rule out any chronic mesenteric ischemia etiology to his constellation of symptoms. I will have him return in 4 weeks. If both the HIDA and CTA are normal, he will need to go to [facility] for a second opinion.

## 2016-01-24 NOTE — Progress Notes (Signed)
CC'ED TO PCP 

## 2016-01-25 ENCOUNTER — Other Ambulatory Visit: Payer: Self-pay

## 2016-01-25 ENCOUNTER — Ambulatory Visit (HOSPITAL_COMMUNITY): Admission: RE | Admit: 2016-01-25 | Payer: Medicare Other | Source: Ambulatory Visit

## 2016-01-25 ENCOUNTER — Observation Stay (HOSPITAL_COMMUNITY)
Admission: EM | Admit: 2016-01-25 | Discharge: 2016-01-27 | Payer: Medicare Other | Attending: General Surgery | Admitting: General Surgery

## 2016-01-25 ENCOUNTER — Ambulatory Visit (HOSPITAL_COMMUNITY)
Admission: RE | Admit: 2016-01-25 | Discharge: 2016-01-25 | Disposition: A | Discharge status: Home / Self Care | Payer: Medicare Other | Source: Ambulatory Visit | Attending: Gastroenterology | Admitting: Gastroenterology

## 2016-01-25 ENCOUNTER — Telehealth: Payer: Self-pay

## 2016-01-25 ENCOUNTER — Emergency Department (HOSPITAL_COMMUNITY): Payer: Medicare Other

## 2016-01-25 ENCOUNTER — Encounter (HOSPITAL_COMMUNITY)
Admission: RE | Admit: 2016-01-25 | Discharge: 2016-01-25 | Disposition: A | Discharge status: Home / Self Care | Payer: Medicare Other | Source: Ambulatory Visit | Attending: Gastroenterology | Admitting: Gastroenterology

## 2016-01-25 ENCOUNTER — Encounter (HOSPITAL_COMMUNITY): Payer: Self-pay | Admitting: Emergency Medicine

## 2016-01-25 ENCOUNTER — Encounter (HOSPITAL_COMMUNITY): Payer: Self-pay

## 2016-01-25 DIAGNOSIS — F209 Schizophrenia, unspecified: Secondary | ICD-10-CM | POA: Insufficient documentation

## 2016-01-25 DIAGNOSIS — R630 Anorexia: Secondary | ICD-10-CM | POA: Diagnosis not present

## 2016-01-25 DIAGNOSIS — K819 Cholecystitis, unspecified: Secondary | ICD-10-CM

## 2016-01-25 DIAGNOSIS — R32 Unspecified urinary incontinence: Secondary | ICD-10-CM | POA: Diagnosis not present

## 2016-01-25 DIAGNOSIS — Z79899 Other long term (current) drug therapy: Secondary | ICD-10-CM | POA: Diagnosis not present

## 2016-01-25 DIAGNOSIS — R1011 Right upper quadrant pain: Secondary | ICD-10-CM | POA: Diagnosis present

## 2016-01-25 DIAGNOSIS — K810 Acute cholecystitis: Secondary | ICD-10-CM

## 2016-01-25 DIAGNOSIS — F79 Unspecified intellectual disabilities: Secondary | ICD-10-CM

## 2016-01-25 DIAGNOSIS — R634 Abnormal weight loss: Secondary | ICD-10-CM | POA: Diagnosis not present

## 2016-01-25 DIAGNOSIS — Z7951 Long term (current) use of inhaled steroids: Secondary | ICD-10-CM | POA: Diagnosis not present

## 2016-01-25 DIAGNOSIS — K551 Chronic vascular disorders of intestine: Secondary | ICD-10-CM

## 2016-01-25 DIAGNOSIS — K219 Gastro-esophageal reflux disease without esophagitis: Secondary | ICD-10-CM

## 2016-01-25 DIAGNOSIS — I10 Essential (primary) hypertension: Secondary | ICD-10-CM | POA: Insufficient documentation

## 2016-01-25 DIAGNOSIS — G4733 Obstructive sleep apnea (adult) (pediatric): Secondary | ICD-10-CM | POA: Diagnosis not present

## 2016-01-25 DIAGNOSIS — F17210 Nicotine dependence, cigarettes, uncomplicated: Secondary | ICD-10-CM | POA: Diagnosis not present

## 2016-01-25 DIAGNOSIS — K8010 Calculus of gallbladder with chronic cholecystitis without obstruction: Principal | ICD-10-CM | POA: Insufficient documentation

## 2016-01-25 DIAGNOSIS — Z01818 Encounter for other preprocedural examination: Secondary | ICD-10-CM | POA: Diagnosis not present

## 2016-01-25 HISTORY — DX: Acute cholecystitis: K81.0

## 2016-01-25 HISTORY — DX: Esophageal obstruction: K22.2

## 2016-01-25 LAB — URINALYSIS, ROUTINE W REFLEX MICROSCOPIC
Bilirubin Urine: NEGATIVE
GLUCOSE, UA: NEGATIVE mg/dL
HGB URINE DIPSTICK: NEGATIVE
Ketones, ur: NEGATIVE mg/dL
LEUKOCYTES UA: NEGATIVE
Nitrite: NEGATIVE
PH: 5.5 (ref 5.0–8.0)
Protein, ur: NEGATIVE mg/dL

## 2016-01-25 LAB — CBC WITH DIFFERENTIAL/PLATELET
BASOS ABS: 0 10*3/uL (ref 0.0–0.1)
BASOS PCT: 0 %
EOS PCT: 1 %
Eosinophils Absolute: 0 10*3/uL (ref 0.0–0.7)
HEMATOCRIT: 41.7 % (ref 39.0–52.0)
Hemoglobin: 14.6 g/dL (ref 13.0–17.0)
LYMPHS PCT: 52 %
Lymphs Abs: 1.9 10*3/uL (ref 0.7–4.0)
MCH: 31.3 pg (ref 26.0–34.0)
MCHC: 35 g/dL (ref 30.0–36.0)
MCV: 89.5 fL (ref 78.0–100.0)
Monocytes Absolute: 0.3 10*3/uL (ref 0.1–1.0)
Monocytes Relative: 8 %
NEUTROS ABS: 1.4 10*3/uL — AB (ref 1.7–7.7)
Neutrophils Relative %: 39 %
PLATELETS: 133 10*3/uL — AB (ref 150–400)
RBC: 4.66 MIL/uL (ref 4.22–5.81)
RDW: 12.3 % (ref 11.5–15.5)
WBC: 3.6 10*3/uL — AB (ref 4.0–10.5)

## 2016-01-25 LAB — COMPREHENSIVE METABOLIC PANEL
ALBUMIN: 4.1 g/dL (ref 3.5–5.0)
ALT: 19 U/L (ref 17–63)
AST: 26 U/L (ref 15–41)
Alkaline Phosphatase: 68 U/L (ref 38–126)
Anion gap: 9 (ref 5–15)
BUN: 17 mg/dL (ref 6–20)
CHLORIDE: 99 mmol/L — AB (ref 101–111)
CO2: 32 mmol/L (ref 22–32)
CREATININE: 1.13 mg/dL (ref 0.61–1.24)
Calcium: 9.7 mg/dL (ref 8.9–10.3)
GFR calc Af Amer: >60 mL/min (ref >60)
GLUCOSE: 107 mg/dL — AB (ref 65–99)
POTASSIUM: 3.5 mmol/L (ref 3.5–5.1)
Sodium: 140 mmol/L (ref 135–145)
Total Bilirubin: 0.7 mg/dL (ref 0.3–1.2)
Total Protein: 7 g/dL (ref 6.5–8.1)

## 2016-01-25 LAB — PROTIME-INR
INR: 1.09 (ref 0.00–1.49)
PROTHROMBIN TIME: 14.3 s (ref 11.6–15.2)

## 2016-01-25 LAB — LIPASE, BLOOD: LIPASE: 25 U/L (ref 11–51)

## 2016-01-25 LAB — APTT: aPTT: 29 seconds (ref 24–37)

## 2016-01-25 MED ORDER — PANTOPRAZOLE SODIUM 40 MG PO TBEC
40.0000 mg | DELAYED_RELEASE_TABLET | Freq: Every day | ORAL | Status: DC
  Administered 2016-01-27: 40 mg via ORAL
  Filled 2016-01-25: qty 1

## 2016-01-25 MED ORDER — SODIUM CHLORIDE 0.9 % IV SOLN
250.0000 mL | INTRAVENOUS | Status: DC | PRN
Start: 2016-01-25 — End: 2016-01-27
  Administered 2016-01-26: 250 mL via INTRAVENOUS

## 2016-01-25 MED ORDER — CYCLOBENZAPRINE HCL 10 MG PO TABS
10.0000 mg | ORAL_TABLET | Freq: Every day | ORAL | Status: DC
  Administered 2016-01-25 – 2016-01-26 (×2): 10 mg via ORAL
  Filled 2016-01-25 (×2): qty 1

## 2016-01-25 MED ORDER — IOHEXOL 300 MG/ML  SOLN: 100.0000 mL | Freq: Once | INTRAMUSCULAR | Status: DC | PRN

## 2016-01-25 MED ORDER — TAMSULOSIN HCL 0.4 MG PO CAPS
0.4000 mg | ORAL_CAPSULE | Freq: Every evening | ORAL | Status: DC
  Administered 2016-01-25 – 2016-01-26 (×2): 0.4 mg via ORAL
  Filled 2016-01-25 (×2): qty 1

## 2016-01-25 MED ORDER — ONDANSETRON HCL 4 MG/2ML IJ SOLN: 4.0000 mg | Freq: Four times a day (QID) | INTRAMUSCULAR | Status: DC | PRN

## 2016-01-25 MED ORDER — MORPHINE SULFATE (PF) 4 MG/ML IV SOLN
3.0000 mg | Freq: Once | INTRAVENOUS | Status: AC
  Administered 2016-01-25: 3 mg via INTRAVENOUS

## 2016-01-25 MED ORDER — SODIUM CHLORIDE 0.9% FLUSH
3.0000 mL | INTRAVENOUS | Status: DC | PRN
Start: 2016-01-25 — End: 2016-01-26

## 2016-01-25 MED ORDER — ONDANSETRON HCL 4 MG PO TABS: 4.0000 mg | ORAL_TABLET | Freq: Four times a day (QID) | ORAL | Status: DC | PRN

## 2016-01-25 MED ORDER — METOPROLOL TARTRATE 25 MG PO TABS
25.0000 mg | ORAL_TABLET | Freq: Two times a day (BID) | ORAL | Status: DC
  Administered 2016-01-25 – 2016-01-27 (×4): 25 mg via ORAL
  Filled 2016-01-25 (×4): qty 1

## 2016-01-25 MED ORDER — MORPHINE SULFATE (PF) 4 MG/ML IV SOLN
INTRAVENOUS | Status: AC
  Administered 2016-01-25: 3 mg via INTRAVENOUS
  Filled 2016-01-25: qty 1

## 2016-01-25 MED ORDER — SODIUM CHLORIDE 0.9% FLUSH
3.0000 mL | Freq: Two times a day (BID) | INTRAVENOUS | Status: DC
  Administered 2016-01-25 – 2016-01-27 (×4): 3 mL via INTRAVENOUS

## 2016-01-25 MED ORDER — THIORIDAZINE HCL 25 MG PO TABS
25.0000 mg | ORAL_TABLET | Freq: Two times a day (BID) | ORAL | Status: DC
  Administered 2016-01-25 – 2016-01-27 (×3): 25 mg via ORAL
  Filled 2016-01-25 (×3): qty 1

## 2016-01-25 MED ORDER — TECHNETIUM TC 99M MEBROFENIN IV KIT
5.0000 | PACK | Freq: Once | INTRAVENOUS | Status: AC | PRN
  Administered 2016-01-25: 5.4 via INTRAVENOUS

## 2016-01-25 MED ORDER — TRIAMTERENE-HCTZ 37.5-25 MG PO TABS
1.0000 | ORAL_TABLET | Freq: Every morning | ORAL | Status: DC
  Administered 2016-01-27: 1 via ORAL
  Filled 2016-01-25: qty 1

## 2016-01-25 MED ORDER — AMLODIPINE BESYLATE 5 MG PO TABS
5.0000 mg | ORAL_TABLET | Freq: Every morning | ORAL | Status: DC
  Administered 2016-01-27: 5 mg via ORAL
  Filled 2016-01-25: qty 1

## 2016-01-25 MED ORDER — BUPROPION HCL ER (SR) 150 MG PO TB12
150.0000 mg | ORAL_TABLET | Freq: Two times a day (BID) | ORAL | Status: DC
  Administered 2016-01-26 – 2016-01-27 (×2): 150 mg via ORAL
  Filled 2016-01-25 (×8): qty 1

## 2016-01-25 MED ORDER — HYDRALAZINE HCL 20 MG/ML IJ SOLN: 5.0000 mg | INTRAMUSCULAR | Status: DC | PRN

## 2016-01-25 MED ORDER — HEPARIN SODIUM (PORCINE) 5000 UNIT/ML IJ SOLN
5000.0000 [IU] | Freq: Three times a day (TID) | INTRAMUSCULAR | Status: AC
  Administered 2016-01-25: 5000 [IU] via SUBCUTANEOUS
  Filled 2016-01-25: qty 1

## 2016-01-25 MED ORDER — PANTOPRAZOLE SODIUM 40 MG PO TBEC: 40.0000 mg | DELAYED_RELEASE_TABLET | Freq: Every day | ORAL | Status: DC

## 2016-01-25 MED ORDER — FAMOTIDINE 20 MG PO TABS
40.0000 mg | ORAL_TABLET | Freq: Every day | ORAL | Status: DC
  Administered 2016-01-25: 40 mg via ORAL
  Filled 2016-01-25: qty 2

## 2016-01-25 MED ORDER — OXYBUTYNIN CHLORIDE 5 MG PO TABS
5.0000 mg | ORAL_TABLET | Freq: Every morning | ORAL | Status: DC
  Administered 2016-01-27: 5 mg via ORAL
  Filled 2016-01-25: qty 1

## 2016-01-25 MED ORDER — MORPHINE SULFATE (PF) 2 MG/ML IV SOLN
2.0000 mg | INTRAVENOUS | Status: DC | PRN
  Administered 2016-01-25: 2 mg via INTRAVENOUS
  Filled 2016-01-25: qty 1

## 2016-01-25 NOTE — ED Notes (Signed)
Attempted to call report, Helmut Muster, RN, assuming care of pt, unable to take report at this time. Will call back.

## 2016-01-25 NOTE — ED Notes (Signed)
Spoke with Hassel Neth, relief manager of group home and she said Eldridge Scot, group home manager will be here by 7am to sign any papers needed for surgery.  SHe also left the following numbers: Hassel Neth [phone] Group Home [phone] Eldridge Scot, Manager [phone]

## 2016-01-25 NOTE — Telephone Encounter (Signed)
Emma from [facility] called back- pt is currently in [facility] ED. She said he was in extreme pain so they had already taken him there.

## 2016-01-25 NOTE — ED Notes (Signed)
PT has MR and is resident of [facility] with staff present on arrival to ED. 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 PT sent to ED for eval from Dr. Aura Fey office today d/t vomiting and acute cholelithiasis per ultrasound today. PT states normal BM last night and only coffee for breakfast and denies any other po intake today.

## 2016-01-25 NOTE — ED Provider Notes (Addendum)
CSN: [id number]     Arrival date & time 01/25/16  1544 History   First MD Initiated Contact with Patient 01/25/16 1558     Chief Complaint  Patient presents with  . Abdominal Pain      HPI  Patient presents for evaluation of episodes of abdominal pain and weight loss and abnormal imaging studies today.  Patient has a history of mental retardation. He lives at [facility]. Police had several months of intermittent episodes of abdominal pain and some normal days and some days raise painful. He is minimally verbal. He'll complain a sudden severe pain. He has been losing weight and has become fearful eating. Normal bowel movement last night. Had some liquids this morning but has not eaten to the rest of the day. Seen and evaluated at the GI physicians office today. Hydroscan, and CT angiogram of the abdomen were ordered. CT Carlyon Prows shows no acute abdomen allergies. High-risk and shows nonvisualization of the gallbladder and cystic duct. They received a call to the office regarding the results. Per the office Notes when the group home was contacted they stated that Mr. Weisenberger was having "severe abdominal pain". They referred to the emergency room with concern of acute cholecystitis.   Past Medical History  Diagnosis Date  . Hypertension   . Schizophrenia (HCC)   . Mental retardation   . Chronic vomiting   . OSA (obstructive sleep apnea)     no cpap machine  . Schatzki's ring   . Acute cholecystitis 01/25/2016   Past Surgical History  Procedure Laterality Date  . None    . Colonoscopy N/A 09/21/2014    [name]: right-sided diverticulosis, internal hemorrhoids. Repeat in 10 years.   . Esophagogastroduodenoscopy N/A 09/21/2014    [name]: Subtle non-critical Schatzki's ring s/p 46 F dilation, query occult cervical esophageal web. Hiatal hernia. Question gastroparesis.   Gaspar Bidding dilation N/A 09/21/2014    Procedure: SAVORY DILATION;  Surgeon: [name], MD;  Location: [facility];  Service: Endoscopy;  Laterality: N/A;  Elease Hashimoto dilation N/A 09/21/2014    Procedure: Elease Hashimoto DILATION;  Surgeon: [name], MD;  Location: [facility];  Service: Endoscopy;  Laterality: N/A;   Family History  Problem Relation Age of Onset  . Colon cancer      unknown   Social History  Substance Use Topics  . Smoking status: Current Every Day Smoker -- 0.50 packs/day    Types: Cigarettes  . Smokeless tobacco: None     Comment: 4 cigarettes a day  . Alcohol Use: No    Review of Systems  Constitutional: Negative for fever, chills, diaphoresis, appetite change and fatigue.  HENT: Negative for mouth sores, sore throat and trouble swallowing.   Eyes: Negative for visual disturbance.  Respiratory: Negative for cough, chest tightness, shortness of breath and wheezing.   Cardiovascular: Negative for chest pain.  Gastrointestinal: Positive for nausea and abdominal pain. Negative for vomiting, diarrhea and abdominal distention.  Endocrine: Negative for polydipsia, polyphagia and polyuria.  Genitourinary: Negative for dysuria, frequency and hematuria.  Musculoskeletal: Negative for gait problem.  Skin: Negative for color change, pallor and rash.  Neurological: Negative for dizziness, syncope, light-headedness and headaches.  Hematological: Does not bruise/bleed easily.  Psychiatric/Behavioral: Negative for behavioral problems and confusion.      Allergies  Review of patient's allergies indicates no known allergies.  Home Medications   Prior to Admission medications   Medication Sig Start Date End Date  Taking? Authorizing Provider  amLODipine (NORVASC) 5 MG tablet Take 5 mg by mouth every morning.    Historical Provider, MD  buPROPion (WELLBUTRIN SR) 150 MG 12 hr tablet Take 150 mg by mouth 2 (two) times daily.    Historical Provider, MD  cyclobenzaprine (FLEXERIL) 10 MG tablet Take 10 mg by mouth at bedtime.    Historical Provider, MD  docusate sodium (COLACE) 100 MG  capsule Take 100 mg by mouth 2 (two) times daily.    Historical Provider, MD  famotidine (PEPCID) 40 MG tablet Take 40 mg by mouth at bedtime.    Historical Provider, MD  fluticasone (FLONASE) 50 MCG/ACT nasal spray Place 2 sprays into both nostrils daily.    Historical Provider, MD  gemfibrozil (LOPID) 600 MG tablet Take 600 mg by mouth 2 (two) times daily.    Historical Provider, MD  metoprolol (LOPRESSOR) 50 MG tablet Take 25 mg by mouth 2 (two) times daily.     Historical Provider, MD  omeprazole (PRILOSEC) 20 MG capsule Take 20 mg by mouth daily before breakfast.    Historical Provider, MD  ondansetron (ZOFRAN) 4 MG tablet Take 1 tablet (4 mg total) by mouth every 8 (eight) hours as needed for nausea or vomiting. 10/17/15   Nira Retort, NP  oxybutynin (DITROPAN) 5 MG tablet Take 5 mg by mouth every morning.    Historical Provider, MD  potassium chloride (MICRO-K) 10 MEQ CR capsule Take 10 mEq by mouth 2 (two) times daily.    Historical Provider, MD  tamsulosin (FLOMAX) 0.4 MG CAPS capsule Take 0.4 mg by mouth daily after breakfast.     Historical Provider, MD  thioridazine (MELLARIL) 25 MG tablet Take 50 mg by mouth 2 (two) times daily. Patient takes 2 tablets in the morning and 4 tablets at bedtime    Historical Provider, MD  triamterene-hydrochlorothiazide (MAXZIDE-25) 37.5-25 MG per tablet Take 1 tablet by mouth every morning.    Historical Provider, MD  Vitamin D, Ergocalciferol, (DRISDOL) 50000 UNITS CAPS capsule Take 50,000 Units by mouth every 14 (fourteen) days.    Historical Provider, MD   BP 150/95 mmHg  Pulse 59  Temp(Src) 97.4 F (36.3 C) (Oral)  Resp 18  Ht 5\' 8"  (1.727 m)  Wt 165 lb (74.844 kg)  BMI 25.09 kg/m2  SpO2 96% Physical Exam  Constitutional: He appears well-developed and well-nourished. No distress.  HENT:  Head: Normocephalic.  Eyes: Conjunctivae are normal. Pupils are equal, round, and reactive to light. No scleral icterus.  Neck: Normal range of motion. Neck  supple. No thyromegaly present.  Cardiovascular: Normal rate and regular rhythm.  Exam reveals no gallop and no friction rub.   No murmur heard. Pulmonary/Chest: Effort normal and breath sounds normal. No respiratory distress. He has no wheezes. He has no rales.  Abdominal: Soft. Bowel sounds are normal. He exhibits no distension. There is no tenderness. There is no rebound.  No appreciable tenderness or abnormality on his exam here. Caregiver/family states that his symptoms seemed to get better "just like they always do".  Musculoskeletal: Normal range of motion.  Neurological: He is alert.  Skin: Skin is warm and dry. No rash noted.  Psychiatric: He has a normal mood and affect. His behavior is normal.    ED Course  Procedures (including critical care time) Labs Review Labs Reviewed  CBC WITH DIFFERENTIAL/PLATELET - Abnormal; Notable for the following:    WBC 3.6 (*)    Platelets 133 (*)    Neutro  Abs 1.4 (*)    All other components within normal limits  COMPREHENSIVE METABOLIC PANEL - Abnormal; Notable for the following:    Chloride 99 (*)    Glucose, Bld 107 (*)    All other components within normal limits  LIPASE, BLOOD  URINALYSIS, ROUTINE W REFLEX MICROSCOPIC (NOT AT [facility])    Imaging Review Nm Hepato W/eject Fract  01/25/2016  CLINICAL DATA:  Weight loss, abdominal pain, hypertension; patient unable to provide additional accurate history EXAM: NUCLEAR MEDICINE HEPATOBILIARY IMAGING TECHNIQUE: Sequential images of the abdomen were obtained out to 60 minutes following intravenous administration of radiopharmaceutical. RADIOPHARMACEUTICALS:  5.4 mCi Tc-74m  Choletec IV Pharmaceutical:  3 mg morphine sulfate IV 1 hour into the exam. COMPARISON:  None FINDINGS: Normal tracer extraction from bloodstream indicating normal hepatocellular function. Prompt excretion of tracer into biliary tree. Small bowel visualized by 16 minutes. At 1 hour, gallbladder had not visualized. No hepatic  retention of tracer. Following morphine, imaging was continued for 30 minutes. Continued emptying of tracer from liver into small bowel identified. Gallbladder fails to visualize during the 30 minutes following morphine administration. Small amount of duodenogastric reflux of tracer noted. IMPRESSION: Nonvisualization of the gallbladder despite morphine augmentation consistent with cystic duct obstruction and acute cholecystitis. Normal hepatocellular function with patent CBD. Electronically Signed   By: Ulyses Southward M.D.   On: 01/25/2016 14:24   Ct Angio Abd/pel W/ And/or W/o  01/25/2016  CLINICAL DATA:  Patient complains of pain after eating. Decreased appetite and lost 30 pounds in 1 year. Evaluate for mesenteric artery stenosis and chronic mesenteric ischemia. EXAM: CT ANGIOGRAPHY ABDOMEN AND PELVIS TECHNIQUE: Multidetector CT imaging of the abdomen and pelvis was performed using the standard protocol during bolus administration of intravenous contrast. Multiplanar reconstructed images including MIPs were obtained and reviewed to evaluate the vascular anatomy. CONTRAST:  100 ml Omnipaque 350 COMPARISON:  05/05/2014 FINDINGS: ARTERIAL FINDINGS: Aorta: Normal caliber of the abdominal aorta without dissection. There is a small amount of atherosclerotic disease and plaque in the abdominal aorta. Again noted is a small amount of left posterior mural thrombus just below the accessory left renal artery. Celiac axis: There is motion and streak artifact in the upper abdomen but the celiac trunk appears to be widely patent. There is flow in the splenic artery, left gastric artery and common hepatic artery. Limited evaluation of the common hepatic branches. Superior mesenteric: Superior mesenteric artery is widely patent. Left renal: Main left renal artery is widely patent. There is an accessory inferior left renal artery. Right renal: Single right renal artery is widely patent without plaque. Inferior mesenteric:  Inferior mesenteric artery is patent without significant plaque. Left iliac: Minimal plaque in the left iliac arteries without significant stenosis. Proximal left femoral arteries are patent. Right iliac: Minimal plaque in the right iliac arteries without significant stenosis. Proximal right femoral arteries are patent. Venous findings: Portal venous system is patent. No gross abnormality to the iliac veins, IVC or renal veins. Review of the MIP images confirms the above findings. NONVASCULAR FINDINGS: No evidence for free air. Streak artifact in the upper abdomen related to the patient's arm over the upper abdomen. No gross abnormality to the liver and the gallbladder is decompressed. No acute abnormality involving the pancreas. No significant biliary or pancreatic duct dilatation. Normal appearance of the spleen without enlargement. Normal appearance of bilateral adrenal glands. The again noted is a large lobulated cystic structure involving the right kidney upper pole measuring up to 4.7 cm  and previously measured 4.5 cm. Small cyst involving the right kidney lower pole. There is no evidence for hydronephrosis. Again noted is a low-density cyst involving the medial left kidney measuring 2.4 cm, previously measuring 1.9 cm. Previously, there was a dense cystic structure involving the anterior left kidney upper pole but this has apparently resolved. No acute abnormality involving the stomach or duodenum. There is no gross abnormality to the small bowel. Moderate amount of stool throughout the colon without acute inflammatory changes. No gross abnormality to the prostate or urinary bladder. No significant free fluid or lymphadenopathy. Again noted are a few lucent areas involving the iliac bones. Degenerative endplate disease at L5-S1 with mild disc space narrowing. IMPRESSION: No evidence for mesenteric artery stenosis. There is minimal plaque within the abdominal aorta and no significant plaque or stenosis  involving the mesenteric arteries. Bilateral renal cysts as described. No acute abnormalities in the abdomen or pelvis. Electronically Signed   By: Richarda Overlie M.D.   On: 01/25/2016 16:39   I have personally reviewed and evaluated these images and lab results as part of my medical decision-making.   EKG Interpretation None      MDM   Final diagnoses:  Cholecystitis    I discussed the case with Dr. Lovell Sheehan. He had not been previously notified by the patient's GI physician. He felt the patient could be safely admitted to the hospital for planned procedure tomorrow. He asked that I speak with [facility] hospitalist regarding admission.    Rolland Porter, MD 01/25/16 Merrily Brittle  Rolland Porter, MD 01/25/16 [phone]

## 2016-01-25 NOTE — H&P (Signed)
[facility] History and Physical  DECORY FRENI MVH:[id number] DOB: [birth year] DOA: 01/25/2016  Referring physician: Dr Fayrene Fearing - [facility] PCP: [name], MD   Chief Complaint: abd pain  HPI: Kyle Wade is a 60 y.o. male  Level V caveat: Patient unable to give adequate history due to baseline mental retardation. History provided by EDP, and staff member from patient's home.  Patient is expecting intermittent right upper quadrant abdominal pain over the last several months. Typically this follows intake of food. Followed by Dr. Dionicia Abler in the outpatient setting. Outpatient workup consistent with cholelithiasis. Currently patient is without abdominal pain. Patient dilated by Dr. Lovell Sheehan of general surgery feels that surgical cholecystectomy is appropriate. Patient currently denies chest pain, vomiting, nausea, diarrhea, constipation, abdominal pain, fevers, shortness of breath. There's been no reported change in patient's mental status recently from his group home.   Review of Systems:  Unable to obtain further review systems due to patient's mental status  Past Medical History  Diagnosis Date  . Hypertension   . Schizophrenia (HCC)   . Mental retardation   . Chronic vomiting   . OSA (obstructive sleep apnea)     no cpap machine  . Schatzki's ring   . Acute cholecystitis 01/25/2016   Past Surgical History  Procedure Laterality Date  . None    . Colonoscopy N/A 09/21/2014    [name]: right-sided diverticulosis, internal hemorrhoids. Repeat in 10 years.   . Esophagogastroduodenoscopy N/A 09/21/2014    [name]: Subtle non-critical Schatzki's ring s/p 41 F dilation, query occult cervical esophageal web. Hiatal hernia. Question gastroparesis.   Gaspar Bidding dilation N/A 09/21/2014    Procedure: SAVORY DILATION;  Surgeon: [name], MD;  Location: [facility];  Service: Endoscopy;  Laterality: N/A;  Elease Hashimoto dilation N/A 09/21/2014    Procedure: Elease Hashimoto DILATION;  Surgeon:  [name], MD;  Location: [facility];  Service: Endoscopy;  Laterality: N/A;   Social History:  reports that he has been smoking Cigarettes.  He has been smoking about 0.50 packs per day. He does not have any smokeless tobacco history on file. He reports that he does not drink alcohol or use illicit drugs.  No Known Allergies  Family History  Problem Relation Age of Onset  . Colon cancer      unknown     Prior to Admission medications   Medication Sig Start Date End Date Taking? Authorizing Provider  amLODipine (NORVASC) 5 MG tablet Take 5 mg by mouth every morning.    Historical Provider, MD  buPROPion (WELLBUTRIN SR) 150 MG 12 hr tablet Take 150 mg by mouth 2 (two) times daily.    Historical Provider, MD  cyclobenzaprine (FLEXERIL) 10 MG tablet Take 10 mg by mouth at bedtime.    Historical Provider, MD  docusate sodium (COLACE) 100 MG capsule Take 100 mg by mouth 2 (two) times daily.    Historical Provider, MD  famotidine (PEPCID) 40 MG tablet Take 40 mg by mouth at bedtime.    Historical Provider, MD  fluticasone (FLONASE) 50 MCG/ACT nasal spray Place 2 sprays into both nostrils daily.    Historical Provider, MD  gemfibrozil (LOPID) 600 MG tablet Take 600 mg by mouth 2 (two) times daily.    Historical Provider, MD  metoprolol (LOPRESSOR) 50 MG tablet Take 25 mg by mouth 2 (two) times daily.     Historical Provider, MD  omeprazole (PRILOSEC) 20 MG capsule Take 20 mg by mouth daily before  breakfast.    Historical Provider, MD  ondansetron (ZOFRAN) 4 MG tablet Take 1 tablet (4 mg total) by mouth every 8 (eight) hours as needed for nausea or vomiting. 10/17/15   Nira Retort, NP  oxybutynin (DITROPAN) 5 MG tablet Take 5 mg by mouth every morning.    Historical Provider, MD  potassium chloride (MICRO-K) 10 MEQ CR capsule Take 10 mEq by mouth 2 (two) times daily.    Historical Provider, MD  tamsulosin (FLOMAX) 0.4 MG CAPS capsule Take 0.4 mg by mouth daily after breakfast.     Historical  Provider, MD  thioridazine (MELLARIL) 25 MG tablet Take 50 mg by mouth 2 (two) times daily. Patient takes 2 tablets in the morning and 4 tablets at bedtime    Historical Provider, MD  triamterene-hydrochlorothiazide (MAXZIDE-25) 37.5-25 MG per tablet Take 1 tablet by mouth every morning.    Historical Provider, MD  Vitamin D, Ergocalciferol, (DRISDOL) 50000 UNITS CAPS capsule Take 50,000 Units by mouth every 14 (fourteen) days.    Historical Provider, MD   Physical Exam: Filed Vitals:   01/25/16 1603 01/25/16 1700 01/25/16 1800 01/25/16 1830  BP: 152/84 150/95 136/80 132/84  Pulse: 60 59 54 51  Temp: 97.4 F (36.3 C)     TempSrc: Oral     Resp: Height:  (1.727 m)     Weight: 74.844 kg (165 lb)     SpO2: 95% 96% 98% 99%    Wt Readings from Last 3 Encounters:  01/25/16 74.844 kg (165 lb)  01/24/16 75.66 kg (166 lb 12.8 oz)  01/03/16 75.116 kg (165 lb 9.6 oz)    General:  Appears calm and comfortable Eyes:  PERRL, EOMI, normal lids, iris ENT:  grossly normal hearing, lips & tongue Neck:  no LAD, masses or thyromegaly Cardiovascular:  RRR, no m/r/g. No LE edema.  Respiratory:  CTA bilaterally, no w/r/r. Normal respiratory effort. Abdomen:  soft, ntnd Skin:  no rash or induration seen on limited exam Musculoskeletal:  grossly normal tone BUE/BLE Psychiatric:  Follows basic commands. AOx3 but unsure as to why he is at the hospital.  Pleasant Neurologic:  CN 2-12 grossly intact, moves all extremities in coordinated fashion.          Labs on Admission:  Basic Metabolic Panel:  Recent Labs Lab 01/25/16 1628  NA 140  K 3.5  CL 99*  CO2 32  GLUCOSE 107*  BUN 17  CREATININE 1.13  CALCIUM 9.7   Liver Function Tests:  Recent Labs Lab 01/25/16 1628  AST 26  ALT 19  ALKPHOS 68  BILITOT 0.7  PROT 7.0  ALBUMIN 4.1    Recent Labs Lab 01/25/16 1628  LIPASE 25   No results for input(s): AMMONIA in the last 168 hours. CBC:  Recent Labs Lab  01/25/16 1628  WBC 3.6*  NEUTROABS 1.4*  HGB 14.6  HCT 41.7  MCV 89.5  PLT 133*   Cardiac Enzymes: No results for input(s): CKTOTAL, CKMB, CKMBINDEX, TROPONINI in the last 168 hours.  BNP (last 3 results) No results for input(s): BNP in the last 8760 hours.  ProBNP (last 3 results) No results for input(s): PROBNP in the last 8760 hours.   CREATININE: 1.13 (01/25/16 1628) Estimated creatinine clearance - 68.1 mL/min  CBG: No results for input(s): GLUCAP in the last 168 hours.  Radiological Exams on Admission: Nm Hepato W/eject Fract  01/25/2016  CLINICAL DATA:  Weight loss, abdominal pain, hypertension; patient unable  to provide additional accurate history EXAM: NUCLEAR MEDICINE HEPATOBILIARY IMAGING TECHNIQUE: Sequential images of the abdomen were obtained out to 60 minutes following intravenous administration of radiopharmaceutical. RADIOPHARMACEUTICALS:  5.4 mCi Tc-52m  Choletec IV Pharmaceutical:  3 mg morphine sulfate IV 1 hour into the exam. COMPARISON:  None FINDINGS: Normal tracer extraction from bloodstream indicating normal hepatocellular function. Prompt excretion of tracer into biliary tree. Small bowel visualized by 16 minutes. At 1 hour, gallbladder had not visualized. No hepatic retention of tracer. Following morphine, imaging was continued for 30 minutes. Continued emptying of tracer from liver into small bowel identified. Gallbladder fails to visualize during the 30 minutes following morphine administration. Small amount of duodenogastric reflux of tracer noted. IMPRESSION: Nonvisualization of the gallbladder despite morphine augmentation consistent with cystic duct obstruction and acute cholecystitis. Normal hepatocellular function with patent CBD. Electronically Signed   By: Ulyses Southward M.D.   On: 01/25/2016 14:24   Dg Chest Port 1 View  01/25/2016  CLINICAL DATA:  Preop chest x-ray. EXAM: PORTABLE CHEST 1 VIEW COMPARISON:  December 24, 2013 FINDINGS: The heart size and  mediastinal contours are within normal limits. Stable masses are identified in bilateral upper lobes unchanged compared to prior exam. There is no focal infiltrate, pulmonary edema, or pleural effusion. The visualized skeletal structures are unremarkable. IMPRESSION: No active cardiopulmonary disease. Electronically Signed   By: Sherian Rein M.D.   On: 01/25/2016 18:17   Ct Angio Abd/pel W/ And/or W/o  01/25/2016  CLINICAL DATA:  Patient complains of pain after eating. Decreased appetite and lost 30 pounds in 1 year. Evaluate for mesenteric artery stenosis and chronic mesenteric ischemia. EXAM: CT ANGIOGRAPHY ABDOMEN AND PELVIS TECHNIQUE: Multidetector CT imaging of the abdomen and pelvis was performed using the standard protocol during bolus administration of intravenous contrast. Multiplanar reconstructed images including MIPs were obtained and reviewed to evaluate the vascular anatomy. CONTRAST:  100 ml Omnipaque 350 COMPARISON:  05/05/2014 FINDINGS: ARTERIAL FINDINGS: Aorta: Normal caliber of the abdominal aorta without dissection. There is a small amount of atherosclerotic disease and plaque in the abdominal aorta. Again noted is a small amount of left posterior mural thrombus just below the accessory left renal artery. Celiac axis: There is motion and streak artifact in the upper abdomen but the celiac trunk appears to be widely patent. There is flow in the splenic artery, left gastric artery and common hepatic artery. Limited evaluation of the common hepatic branches. Superior mesenteric: Superior mesenteric artery is widely patent. Left renal: Main left renal artery is widely patent. There is an accessory inferior left renal artery. Right renal: Single right renal artery is widely patent without plaque. Inferior mesenteric: Inferior mesenteric artery is patent without significant plaque. Left iliac: Minimal plaque in the left iliac arteries without significant stenosis. Proximal left femoral arteries are  patent. Right iliac: Minimal plaque in the right iliac arteries without significant stenosis. Proximal right femoral arteries are patent. Venous findings: Portal venous system is patent. No gross abnormality to the iliac veins, IVC or renal veins. Review of the MIP images confirms the above findings. NONVASCULAR FINDINGS: No evidence for free air. Streak artifact in the upper abdomen related to the patient's arm over the upper abdomen. No gross abnormality to the liver and the gallbladder is decompressed. No acute abnormality involving the pancreas. No significant biliary or pancreatic duct dilatation. Normal appearance of the spleen without enlargement. Normal appearance of bilateral adrenal glands. The again noted is a large lobulated cystic structure involving the right kidney  upper pole measuring up to 4.7 cm and previously measured 4.5 cm. Small cyst involving the right kidney lower pole. There is no evidence for hydronephrosis. Again noted is a low-density cyst involving the medial left kidney measuring 2.4 cm, previously measuring 1.9 cm. Previously, there was a dense cystic structure involving the anterior left kidney upper pole but this has apparently resolved. No acute abnormality involving the stomach or duodenum. There is no gross abnormality to the small bowel. Moderate amount of stool throughout the colon without acute inflammatory changes. No gross abnormality to the prostate or urinary bladder. No significant free fluid or lymphadenopathy. Again noted are a few lucent areas involving the iliac bones. Degenerative endplate disease at L5-S1 with mild disc space narrowing. IMPRESSION: No evidence for mesenteric artery stenosis. There is minimal plaque within the abdominal aorta and no significant plaque or stenosis involving the mesenteric arteries. Bilateral renal cysts as described. No acute abnormalities in the abdomen or pelvis. Electronically Signed   By: Richarda Overlie M.D.   On: 01/25/2016 16:39       Assessment/Plan Active Problems:   Schizophrenia (HCC)   Cholecystitis   Mental retardation   Essential hypertension   GERD (gastroesophageal reflux disease)   Incontinence   CHolecystitis: Per Dr Lovell Sheehan, pt will need Cholecystectomy on 01/26/2016. Currently pt is asymptomatic - observation - bland diet then NPO after midnight - morphine PRN - f/u post op on 01/25/16 - coags - EKG - zofran  Mental retardation: stays at group home. At baseline per report. \ - continue mellaril, wellbutrin  HTN: - continue norvasc, metop, HCTZ, trimterene - hydralazine IV PRN  Incontinence: - continue oxybutinin  GERD: - continue pepcid   Code Status: FULL  DVT Prophylaxis: Hepqarin then SCD Family Communication: group home staff Disposition Plan: Pending Improvement    Jandy Brackens Shela Commons, MD Family Medicine [facility] [email or web address] Password [email or web address]

## 2016-01-25 NOTE — Telephone Encounter (Signed)
Called triage nurse to give them a heads up about the [facility] results showing non-visualization of gallbladder even with morphine augmentation consistent with cystic duct obstruction and acute cholecystitis.

## 2016-01-25 NOTE — Telephone Encounter (Signed)
Pt was in CT at the time and was sent home. Ginger got in touch with the caregiver who takes him to his appts and advised her that if he was in severe pain to go back to [facility] to the ED. She stated she would take him there now. Eric to call ED and speak with the triage nurse about what is going on with him.

## 2016-01-25 NOTE — Telephone Encounter (Signed)
[facility] called with urgent results from [facility]- pt has cystic duct obstruction and acute cholecystitis. Spoke with EG d/t AS is out today. We need to find out how pt is doing at this time, he may need to go to the ED if having extreme pain or urgent referral to a surgeon if doing ok at this time but needs to follow a bland diet. Called rouses, spoke with the nursing dept. She is going to find out how he is doing and call me back.

## 2016-01-25 NOTE — ED Notes (Signed)
Attempted to call report, RN unable at this time. 

## 2016-01-26 ENCOUNTER — Encounter (HOSPITAL_COMMUNITY): Payer: Self-pay | Admitting: *Deleted

## 2016-01-26 ENCOUNTER — Observation Stay (HOSPITAL_COMMUNITY): Payer: Medicare Other | Admitting: Anesthesiology

## 2016-01-26 ENCOUNTER — Encounter (HOSPITAL_COMMUNITY)
Admission: EM | Disposition: A | Discharge status: Other Acute Inpatient Hospital | Payer: Self-pay | Source: Home / Self Care | Attending: Emergency Medicine

## 2016-01-26 DIAGNOSIS — K8020 Calculus of gallbladder without cholecystitis without obstruction: Secondary | ICD-10-CM | POA: Diagnosis not present

## 2016-01-26 DIAGNOSIS — F79 Unspecified intellectual disabilities: Secondary | ICD-10-CM | POA: Diagnosis not present

## 2016-01-26 DIAGNOSIS — K819 Cholecystitis, unspecified: Secondary | ICD-10-CM | POA: Diagnosis not present

## 2016-01-26 DIAGNOSIS — K8010 Calculus of gallbladder with chronic cholecystitis without obstruction: Secondary | ICD-10-CM | POA: Diagnosis not present

## 2016-01-26 DIAGNOSIS — I10 Essential (primary) hypertension: Secondary | ICD-10-CM | POA: Diagnosis not present

## 2016-01-26 DIAGNOSIS — F209 Schizophrenia, unspecified: Secondary | ICD-10-CM | POA: Diagnosis not present

## 2016-01-26 HISTORY — PX: CHOLECYSTECTOMY: SHX55

## 2016-01-26 LAB — HEPATIC FUNCTION PANEL
ALT: 38 U/L (ref 17–63)
AST: 54 U/L — AB (ref 15–41)
Albumin: 4.1 g/dL (ref 3.5–5.0)
Alkaline Phosphatase: 71 U/L (ref 38–126)
BILIRUBIN DIRECT: 0.2 mg/dL (ref 0.1–0.5)
BILIRUBIN TOTAL: 0.6 mg/dL (ref 0.3–1.2)
Indirect Bilirubin: 0.4 mg/dL (ref 0.3–0.9)
Total Protein: 7.1 g/dL (ref 6.5–8.1)

## 2016-01-26 LAB — COMPREHENSIVE METABOLIC PANEL
ALBUMIN: 3.8 g/dL (ref 3.5–5.0)
ALK PHOS: 67 U/L (ref 38–126)
ALT: 20 U/L (ref 17–63)
ANION GAP: 8 (ref 5–15)
AST: 23 U/L (ref 15–41)
BILIRUBIN TOTAL: 0.6 mg/dL (ref 0.3–1.2)
BUN: 18 mg/dL (ref 6–20)
CO2: 33 mmol/L — AB (ref 22–32)
Calcium: 9.3 mg/dL (ref 8.9–10.3)
Chloride: 100 mmol/L — ABNORMAL LOW (ref 101–111)
Creatinine, Ser: 1.15 mg/dL (ref 0.61–1.24)
GFR calc Af Amer: >60 mL/min (ref >60)
GFR calc non Af Amer: >60 mL/min (ref >60)
GLUCOSE: 74 mg/dL (ref 65–99)
Potassium: 3.5 mmol/L (ref 3.5–5.1)
SODIUM: 141 mmol/L (ref 135–145)
Total Protein: 6.6 g/dL (ref 6.5–8.1)

## 2016-01-26 LAB — CBC
HEMATOCRIT: 43.3 % (ref 39.0–52.0)
HEMOGLOBIN: 14.9 g/dL (ref 13.0–17.0)
MCH: 31 pg (ref 26.0–34.0)
MCHC: 34.4 g/dL (ref 30.0–36.0)
MCV: 90 fL (ref 78.0–100.0)
Platelets: 136 10*3/uL — ABNORMAL LOW (ref 150–400)
RBC: 4.81 MIL/uL (ref 4.22–5.81)
RDW: 12.3 % (ref 11.5–15.5)
WBC: 3.9 10*3/uL — AB (ref 4.0–10.5)

## 2016-01-26 LAB — SURGICAL PCR SCREEN
MRSA, PCR: NEGATIVE
Staphylococcus aureus: NEGATIVE

## 2016-01-26 SURGERY — LAPAROSCOPIC CHOLECYSTECTOMY
Anesthesia: General | Site: Abdomen

## 2016-01-26 MED ORDER — HALOPERIDOL LACTATE 5 MG/ML IJ SOLN: 1.0000 mg | Freq: Four times a day (QID) | INTRAMUSCULAR | Status: DC | PRN

## 2016-01-26 MED ORDER — MIDAZOLAM HCL 2 MG/2ML IJ SOLN
1.0000 mg | INTRAMUSCULAR | Status: DC | PRN
  Administered 2016-01-26: 2 mg via INTRAVENOUS

## 2016-01-26 MED ORDER — MIDAZOLAM HCL 2 MG/2ML IJ SOLN
INTRAMUSCULAR | Status: AC
  Filled 2016-01-26: qty 2

## 2016-01-26 MED ORDER — FENTANYL CITRATE (PF) 250 MCG/5ML IJ SOLN
INTRAMUSCULAR | Status: AC
  Filled 2016-01-26: qty 5

## 2016-01-26 MED ORDER — HEMOSTATIC AGENTS (NO CHARGE) OPTIME
TOPICAL | Status: DC | PRN
  Administered 2016-01-26: 1 via TOPICAL

## 2016-01-26 MED ORDER — SUCCINYLCHOLINE CHLORIDE 20 MG/ML IJ SOLN
INTRAMUSCULAR | Status: DC | PRN
  Administered 2016-01-26: 120 mg via INTRAVENOUS

## 2016-01-26 MED ORDER — LACTATED RINGERS IV SOLN
INTRAVENOUS | Status: DC
  Administered 2016-01-26: 17:00:00 via INTRAVENOUS

## 2016-01-26 MED ORDER — POVIDONE-IODINE 10 % EX OINT
TOPICAL_OINTMENT | CUTANEOUS | Status: AC
  Filled 2016-01-26: qty 1

## 2016-01-26 MED ORDER — KETOROLAC TROMETHAMINE 30 MG/ML IJ SOLN: 30.0000 mg | Freq: Once | INTRAMUSCULAR | Status: DC

## 2016-01-26 MED ORDER — CIPROFLOXACIN IN D5W 400 MG/200ML IV SOLN
400.0000 mg | INTRAVENOUS | Status: AC
  Administered 2016-01-26: 400 mg via INTRAVENOUS
  Filled 2016-01-26: qty 200

## 2016-01-26 MED ORDER — NEOSTIGMINE METHYLSULFATE 10 MG/10ML IV SOLN
INTRAVENOUS | Status: DC | PRN
  Administered 2016-01-26 (×2): 1 mg via INTRAVENOUS

## 2016-01-26 MED ORDER — FLUTICASONE PROPIONATE 50 MCG/ACT NA SUSP
2.0000 | Freq: Every day | NASAL | Status: DC
  Administered 2016-01-26 – 2016-01-27 (×2): 2 via NASAL
  Filled 2016-01-26: qty 16

## 2016-01-26 MED ORDER — HYDROMORPHONE HCL 1 MG/ML IJ SOLN: 1.0000 mg | INTRAMUSCULAR | Status: DC | PRN

## 2016-01-26 MED ORDER — 0.9 % SODIUM CHLORIDE (POUR BTL) OPTIME
TOPICAL | Status: DC | PRN
  Administered 2016-01-26: 1000 mL

## 2016-01-26 MED ORDER — ACETAMINOPHEN 325 MG PO TABS: 650.0000 mg | ORAL_TABLET | Freq: Four times a day (QID) | ORAL | Status: DC | PRN

## 2016-01-26 MED ORDER — GLYCOPYRROLATE 0.2 MG/ML IJ SOLN
INTRAMUSCULAR | Status: AC
  Filled 2016-01-26: qty 3

## 2016-01-26 MED ORDER — MIDAZOLAM HCL 5 MG/5ML IJ SOLN
INTRAMUSCULAR | Status: DC | PRN
  Administered 2016-01-26: 1 mg via INTRAVENOUS

## 2016-01-26 MED ORDER — ROCURONIUM BROMIDE 100 MG/10ML IV SOLN
INTRAVENOUS | Status: DC | PRN
  Administered 2016-01-26: 15 mg via INTRAVENOUS
  Administered 2016-01-26: 5 mg via INTRAVENOUS

## 2016-01-26 MED ORDER — ENOXAPARIN SODIUM 40 MG/0.4ML ~~LOC~~ SOLN
40.0000 mg | SUBCUTANEOUS | Status: DC
  Administered 2016-01-27: 40 mg via SUBCUTANEOUS
  Filled 2016-01-26: qty 0.4

## 2016-01-26 MED ORDER — BUPIVACAINE HCL (PF) 0.5 % IJ SOLN
INTRAMUSCULAR | Status: AC
  Filled 2016-01-26: qty 30

## 2016-01-26 MED ORDER — PROPOFOL 10 MG/ML IV BOLUS
INTRAVENOUS | Status: AC
  Filled 2016-01-26: qty 20

## 2016-01-26 MED ORDER — GLYCOPYRROLATE 0.2 MG/ML IJ SOLN
INTRAMUSCULAR | Status: DC | PRN
  Administered 2016-01-26: 0.6 mg via INTRAVENOUS

## 2016-01-26 MED ORDER — ROCURONIUM BROMIDE 50 MG/5ML IV SOLN
INTRAVENOUS | Status: AC
  Filled 2016-01-26: qty 1

## 2016-01-26 MED ORDER — FENTANYL CITRATE (PF) 100 MCG/2ML IJ SOLN
INTRAMUSCULAR | Status: DC | PRN
  Administered 2016-01-26 (×2): 50 ug via INTRAVENOUS

## 2016-01-26 MED ORDER — KETOROLAC TROMETHAMINE 30 MG/ML IJ SOLN
INTRAMUSCULAR | Status: AC
Start: 2016-01-26 — End: 2016-01-26
  Filled 2016-01-26: qty 1

## 2016-01-26 MED ORDER — ONDANSETRON HCL 4 MG/2ML IJ SOLN: 4.0000 mg | Freq: Once | INTRAMUSCULAR | Status: DC | PRN

## 2016-01-26 MED ORDER — OXYCODONE-ACETAMINOPHEN 5-325 MG PO TABS
1.0000 | ORAL_TABLET | ORAL | Status: DC | PRN
  Administered 2016-01-26 – 2016-01-27 (×2): 1 via ORAL
  Filled 2016-01-26 (×2): qty 1

## 2016-01-26 MED ORDER — FENTANYL CITRATE (PF) 100 MCG/2ML IJ SOLN
INTRAMUSCULAR | Status: AC
  Filled 2016-01-26: qty 2

## 2016-01-26 MED ORDER — ACETAMINOPHEN 650 MG RE SUPP: 650.0000 mg | Freq: Four times a day (QID) | RECTAL | Status: DC | PRN

## 2016-01-26 MED ORDER — PROPOFOL 10 MG/ML IV BOLUS
INTRAVENOUS | Status: DC | PRN
  Administered 2016-01-26: 11 mg via INTRAVENOUS
  Administered 2016-01-26: 120 mg via INTRAVENOUS

## 2016-01-26 MED ORDER — FENTANYL CITRATE (PF) 100 MCG/2ML IJ SOLN
25.0000 ug | INTRAMUSCULAR | Status: DC | PRN
  Administered 2016-01-26: 50 ug via INTRAVENOUS

## 2016-01-26 MED ORDER — ENSURE ENLIVE PO LIQD
237.0000 mL | Freq: Two times a day (BID) | ORAL | Status: DC
  Administered 2016-01-27: 237 mL via ORAL

## 2016-01-26 MED ORDER — LACTATED RINGERS IV SOLN
INTRAVENOUS | Status: DC
  Administered 2016-01-26: 14:00:00 via INTRAVENOUS

## 2016-01-26 MED ORDER — SUCCINYLCHOLINE CHLORIDE 20 MG/ML IJ SOLN
INTRAMUSCULAR | Status: AC
  Filled 2016-01-26: qty 1

## 2016-01-26 MED ORDER — POVIDONE-IODINE 10 % OINT PACKET
TOPICAL_OINTMENT | CUTANEOUS | Status: DC | PRN
  Administered 2016-01-26: 1 via TOPICAL

## 2016-01-26 MED ORDER — BUPIVACAINE HCL (PF) 0.5 % IJ SOLN
INTRAMUSCULAR | Status: DC | PRN
  Administered 2016-01-26: 10 mL

## 2016-01-26 MED ORDER — LIDOCAINE HCL (CARDIAC) 10 MG/ML IV SOLN
INTRAVENOUS | Status: DC | PRN
  Administered 2016-01-26: 25 mg via INTRAVENOUS

## 2016-01-26 SURGICAL SUPPLY — 46 items
APPLIER CLIP LAPSCP 10X32 DD (CLIP) ×3 IMPLANT
BAG HAMPER (MISCELLANEOUS) ×3 IMPLANT
BAG SPEC RTRVL LRG 6X4 10 (ENDOMECHANICALS) ×1
CHLORAPREP W/TINT 26ML (MISCELLANEOUS) ×3 IMPLANT
CLOTH BEACON ORANGE TIMEOUT ST (SAFETY) ×3 IMPLANT
COVER LIGHT HANDLE STERIS (MISCELLANEOUS) ×6 IMPLANT
CUTTER LINEAR ENDO 35 ART FLEX (STAPLE) ×2 IMPLANT
DECANTER SPIKE VIAL GLASS SM (MISCELLANEOUS) ×3 IMPLANT
ELECT REM PT RETURN 9FT ADLT (ELECTROSURGICAL) ×3
ELECTRODE REM PT RTRN 9FT ADLT (ELECTROSURGICAL) ×1 IMPLANT
FILTER SMOKE EVAC LAPAROSHD (FILTER) ×3 IMPLANT
FORMALIN 10 PREFIL 120ML (MISCELLANEOUS) ×3 IMPLANT
GLOVE BIO SURGEON STRL SZ7 (GLOVE) ×2 IMPLANT
GLOVE BIOGEL PI IND STRL 7.0 (GLOVE) ×1 IMPLANT
GLOVE BIOGEL PI INDICATOR 7.0 (GLOVE) ×6
GLOVE ECLIPSE 6.5 STRL STRAW (GLOVE) ×4 IMPLANT
GLOVE EXAM NITRILE MD LF STRL (GLOVE) ×2 IMPLANT
GLOVE SURG SS PI 7.5 STRL IVOR (GLOVE) ×3 IMPLANT
GOWN STRL REUS W/ TWL XL LVL3 (GOWN DISPOSABLE) ×1 IMPLANT
GOWN STRL REUS W/TWL LRG LVL3 (GOWN DISPOSABLE) ×8 IMPLANT
GOWN STRL REUS W/TWL XL LVL3 (GOWN DISPOSABLE) ×3
HEMOSTAT SNOW SURGICEL 2X4 (HEMOSTASIS) ×3 IMPLANT
INST SET LAPROSCOPIC AP (KITS) ×3 IMPLANT
IV NS IRRIG 3000ML ARTHROMATIC (IV SOLUTION) IMPLANT
KIT ROOM TURNOVER APOR (KITS) ×3 IMPLANT
MANIFOLD NEPTUNE II (INSTRUMENTS) ×3 IMPLANT
NDL INSUFFLATION 14GA 120MM (NEEDLE) ×1 IMPLANT
NEEDLE INSUFFLATION 14GA 120MM (NEEDLE) ×3 IMPLANT
NS IRRIG 1000ML POUR BTL (IV SOLUTION) ×3 IMPLANT
PACK LAP CHOLE LZT030E (CUSTOM PROCEDURE TRAY) ×3 IMPLANT
PAD ARMBOARD 7.5X6 YLW CONV (MISCELLANEOUS) ×3 IMPLANT
POUCH SPECIMEN RETRIEVAL 10MM (ENDOMECHANICALS) ×3 IMPLANT
SET BASIN LINEN APH (SET/KITS/TRAYS/PACK) ×3 IMPLANT
SET TUBE IRRIG SUCTION NO TIP (IRRIGATION / IRRIGATOR) IMPLANT
SLEEVE ENDOPATH XCEL 5M (ENDOMECHANICALS) ×3 IMPLANT
SPONGE GAUZE 2X2 8PLY STER LF (GAUZE/BANDAGES/DRESSINGS) ×1
SPONGE GAUZE 2X2 8PLY STRL LF (GAUZE/BANDAGES/DRESSINGS) ×5 IMPLANT
STAPLER VISISTAT (STAPLE) ×3 IMPLANT
SUT VICRYL 0 UR6 27IN ABS (SUTURE) ×3 IMPLANT
TAPE CLOTH SURG 4X10 WHT LF (GAUZE/BANDAGES/DRESSINGS) ×2 IMPLANT
TROCAR ENDO BLADELESS 11MM (ENDOMECHANICALS) ×3 IMPLANT
TROCAR XCEL NON-BLD 5MMX100MML (ENDOMECHANICALS) ×3 IMPLANT
TROCAR XCEL UNIV SLVE 11M 100M (ENDOMECHANICALS) ×3 IMPLANT
TUBING INSUFFLATION (TUBING) ×3 IMPLANT
WARMER LAPAROSCOPE (MISCELLANEOUS) ×3 IMPLANT
YANKAUER SUCT 12FT TUBE ARGYLE (SUCTIONS) ×3 IMPLANT

## 2016-01-26 NOTE — Consult Note (Signed)
Reason for Consult: Acute cholecystitis Referring Physician: Hospitalist  Kyle Wade is an 60 y.o. male.  HPI: Patient is a 60 year old black male who underwent outpatient CT angiogram of the abdomen as well as a hepatobiliary scan yesterday. He was found on HIDA scan to have nonfilling of the gallbladder, consistent with acute cholecystitis. He was referred to the emergency room for further evaluation and treatment. History is somewhat limited due to the patient's mental status. Apparently has had chronic abdominal pain and nausea for the past few months. Currently he is asymptomatic.  Past Medical History  Diagnosis Date  . Hypertension   . Schizophrenia ([place])   . Mental retardation   . Chronic vomiting   . OSA (obstructive sleep apnea)     no cpap machine  . Schatzki's ring   . Acute cholecystitis 01/25/2016    Past Surgical History  Procedure Laterality Date  . None    . Colonoscopy N/A 09/21/2014    Dr. Gala Romney: right-sided diverticulosis, internal hemorrhoids. Repeat in 10 years.   . Esophagogastroduodenoscopy N/A 09/21/2014    Dr. Gala Romney: Subtle non-critical Schatzki's ring s/p 54 F dilation, query occult cervical esophageal web. Hiatal hernia. Question gastroparesis.   Azzie Almas dilation N/A 09/21/2014    Procedure: SAVORY DILATION;  Surgeon: Daneil Dolin, MD;  Location: [facility];  Service: Endoscopy;  Laterality: N/A;  Venia Minks dilation N/A 09/21/2014    Procedure: Venia Minks DILATION;  Surgeon: Daneil Dolin, MD;  Location: [facility];  Service: Endoscopy;  Laterality: N/A;    Family History  Problem Relation Age of Onset  . Colon cancer      unknown    Social History:  reports that he has been smoking Cigarettes.  He has been smoking about 0.50 packs per day. He does not have any smokeless tobacco history on file. He reports that he does not drink alcohol or use illicit drugs.  Allergies: No Known Allergies  Medications: I have reviewed the patient's current  medications.  Results for orders placed or performed during the hospital encounter of 01/25/16 (from the past 48 hour(s))  CBC with Differential/Platelet     Status: Abnormal   Collection Time: 01/25/16  4:28 PM  Result Value Ref Range   WBC 3.6 (L) 4.0 - 10.5 K/uL   RBC 4.66 4.22 - 5.81 MIL/uL   Hemoglobin 14.6 13.0 - 17.0 g/dL   HCT 41.7 39.0 - 52.0 %   MCV 89.5 78.0 - 100.0 fL   MCH 31.3 26.0 - 34.0 pg   MCHC 35.0 30.0 - 36.0 g/dL   RDW 12.3 11.5 - 15.5 %   Platelets 133 (L) 150 - 400 K/uL   Neutrophils Relative % 39 %   Neutro Abs 1.4 (L) 1.7 - 7.7 K/uL   Lymphocytes Relative 52 %   Lymphs Abs 1.9 0.7 - 4.0 K/uL   Monocytes Relative 8 %   Monocytes Absolute 0.3 0.1 - 1.0 K/uL   Eosinophils Relative 1 %   Eosinophils Absolute 0.0 0.0 - 0.7 K/uL   Basophils Relative 0 %   Basophils Absolute 0.0 0.0 - 0.1 K/uL  Comprehensive metabolic panel     Status: Abnormal   Collection Time: 01/25/16  4:28 PM  Result Value Ref Range   Sodium 140 135 - 145 mmol/L   Potassium 3.5 3.5 - 5.1 mmol/L   Chloride 99 (L) 101 - 111 mmol/L   CO2 32 22 - 32 mmol/L   Glucose, Bld 107 (H) 65 -  99 mg/dL   BUN 17 6 - 20 mg/dL   Creatinine, Ser 1.13 0.61 - 1.24 mg/dL   Calcium 9.7 8.9 - 10.3 mg/dL   Total Protein 7.0 6.5 - 8.1 g/dL   Albumin 4.1 3.5 - 5.0 g/dL   AST 26 15 - 41 U/L   ALT 19 17 - 63 U/L   Alkaline Phosphatase 68 38 - 126 U/L   Total Bilirubin 0.7 0.3 - 1.2 mg/dL   GFR calc non Af Amer >60 >60 mL/min   GFR calc Af Amer >60 >60 mL/min    Comment: (NOTE) The eGFR has been calculated using the CKD EPI equation. This calculation has not been validated in all clinical situations. eGFR's persistently <60 mL/min signify possible Chronic Kidney Disease.    Anion gap 9 5 - 15  Lipase, blood     Status: None   Collection Time: 01/25/16  4:28 PM  Result Value Ref Range   Lipase 25 11 - 51 U/L  APTT     Status: None   Collection Time: 01/25/16  4:28 PM  Result Value Ref Range   aPTT  29 24 - 37 seconds  Protime-INR     Status: None   Collection Time: 01/25/16  4:28 PM  Result Value Ref Range   Prothrombin Time 14.3 11.6 - 15.2 seconds   INR 1.09 0.00 - 1.49  Urinalysis, Routine w reflex microscopic (not at [facility])     Status: Abnormal   Collection Time: 01/25/16  6:00 PM  Result Value Ref Range   Color, Urine YELLOW YELLOW   APPearance CLEAR CLEAR   Specific Gravity, Urine <1.005 (L) 1.005 - 1.030   pH 5.5 5.0 - 8.0   Glucose, UA NEGATIVE NEGATIVE mg/dL   Hgb urine dipstick NEGATIVE NEGATIVE   Bilirubin Urine NEGATIVE NEGATIVE   Ketones, ur NEGATIVE NEGATIVE mg/dL   Protein, ur NEGATIVE NEGATIVE mg/dL   Nitrite NEGATIVE NEGATIVE   Leukocytes, UA NEGATIVE NEGATIVE    Comment: MICROSCOPIC NOT DONE ON URINES WITH NEGATIVE PROTEIN, BLOOD, LEUKOCYTES, NITRITE, OR GLUCOSE <1000 mg/dL.  Surgical pcr screen     Status: None   Collection Time: 01/26/16  2:25 AM  Result Value Ref Range   MRSA, PCR NEGATIVE NEGATIVE   Staphylococcus aureus NEGATIVE NEGATIVE    Comment:        The Xpert SA Assay (FDA approved for NASAL specimens in patients over 94 years of age), is one component of a comprehensive surveillance program.  Test performance has been validated by [facility] for patients greater than or equal to 1 year old. It is not intended to diagnose infection nor to guide or monitor treatment.   CBC     Status: Abnormal   Collection Time: 01/26/16  6:22 AM  Result Value Ref Range   WBC 3.9 (L) 4.0 - 10.5 K/uL   RBC 4.81 4.22 - 5.81 MIL/uL   Hemoglobin 14.9 13.0 - 17.0 g/dL   HCT 43.3 39.0 - 52.0 %   MCV 90.0 78.0 - 100.0 fL   MCH 31.0 26.0 - 34.0 pg   MCHC 34.4 30.0 - 36.0 g/dL   RDW 12.3 11.5 - 15.5 %   Platelets 136 (L) 150 - 400 K/uL    Nm Hepato W/eject Fract  01/25/2016  CLINICAL DATA:  Weight loss, abdominal pain, hypertension; patient unable to provide additional accurate history EXAM: NUCLEAR MEDICINE HEPATOBILIARY IMAGING TECHNIQUE: Sequential  images of the abdomen were obtained out to 60 minutes following intravenous  administration of radiopharmaceutical. RADIOPHARMACEUTICALS:  5.4 mCi Tc-51m Choletec IV Pharmaceutical:  3 mg morphine sulfate IV 1 hour into the exam. COMPARISON:  None FINDINGS: Normal tracer extraction from bloodstream indicating normal hepatocellular function. Prompt excretion of tracer into biliary tree. Small bowel visualized by 16 minutes. At 1 hour, gallbladder had not visualized. No hepatic retention of tracer. Following morphine, imaging was continued for 30 minutes. Continued emptying of tracer from liver into small bowel identified. Gallbladder fails to visualize during the 30 minutes following morphine administration. Small amount of duodenogastric reflux of tracer noted. IMPRESSION: Nonvisualization of the gallbladder despite morphine augmentation consistent with cystic duct obstruction and acute cholecystitis. Normal hepatocellular function with patent CBD. Electronically Signed   By: MLavonia DanaM.D.   On: 01/25/2016 14:24   Dg Chest Port 1 View  01/25/2016  CLINICAL DATA:  Preop chest x-ray. EXAM: PORTABLE CHEST 1 VIEW COMPARISON:  December 24, 2013 FINDINGS: The heart size and mediastinal contours are within normal limits. Stable masses are identified in bilateral upper lobes unchanged compared to prior exam. There is no focal infiltrate, pulmonary edema, or pleural effusion. The visualized skeletal structures are unremarkable. IMPRESSION: No active cardiopulmonary disease. Electronically Signed   By: WAbelardo DieselM.D.   On: 01/25/2016 18:17   Ct Angio Abd/pel W/ And/or W/o  01/25/2016  CLINICAL DATA:  Patient complains of pain after eating. Decreased appetite and lost 30 pounds in 1 year. Evaluate for mesenteric artery stenosis and chronic mesenteric ischemia. EXAM: CT ANGIOGRAPHY ABDOMEN AND PELVIS TECHNIQUE: Multidetector CT imaging of the abdomen and pelvis was performed using the standard protocol during bolus  administration of intravenous contrast. Multiplanar reconstructed images including MIPs were obtained and reviewed to evaluate the vascular anatomy. CONTRAST:  100 ml Omnipaque 350 COMPARISON:  05/05/2014 FINDINGS: ARTERIAL FINDINGS: Aorta: Normal caliber of the abdominal aorta without dissection. There is a small amount of atherosclerotic disease and plaque in the abdominal aorta. Again noted is a small amount of left posterior mural thrombus just below the accessory left renal artery. Celiac axis: There is motion and streak artifact in the upper abdomen but the celiac trunk appears to be widely patent. There is flow in the splenic artery, left gastric artery and common hepatic artery. Limited evaluation of the common hepatic branches. Superior mesenteric: Superior mesenteric artery is widely patent. Left renal: Main left renal artery is widely patent. There is an accessory inferior left renal artery. Right renal: Single right renal artery is widely patent without plaque. Inferior mesenteric: Inferior mesenteric artery is patent without significant plaque. Left iliac: Minimal plaque in the left iliac arteries without significant stenosis. Proximal left femoral arteries are patent. Right iliac: Minimal plaque in the right iliac arteries without significant stenosis. Proximal right femoral arteries are patent. Venous findings: Portal venous system is patent. No gross abnormality to the iliac veins, IVC or renal veins. Review of the MIP images confirms the above findings. NONVASCULAR FINDINGS: No evidence for free air. Streak artifact in the upper abdomen related to the patient's arm over the upper abdomen. No gross abnormality to the liver and the gallbladder is decompressed. No acute abnormality involving the pancreas. No significant biliary or pancreatic duct dilatation. Normal appearance of the spleen without enlargement. Normal appearance of bilateral adrenal glands. The again noted is a large lobulated cystic  structure involving the right kidney upper pole measuring up to 4.7 cm and previously measured 4.5 cm. Small cyst involving the right kidney lower pole. There is no evidence  for hydronephrosis. Again noted is a low-density cyst involving the medial left kidney measuring 2.4 cm, previously measuring 1.9 cm. Previously, there was a dense cystic structure involving the anterior left kidney upper pole but this has apparently resolved. No acute abnormality involving the stomach or duodenum. There is no gross abnormality to the small bowel. Moderate amount of stool throughout the colon without acute inflammatory changes. No gross abnormality to the prostate or urinary bladder. No significant free fluid or lymphadenopathy. Again noted are a few lucent areas involving the iliac bones. Degenerative endplate disease at G9-Q9 with mild disc space narrowing. IMPRESSION: No evidence for mesenteric artery stenosis. There is minimal plaque within the abdominal aorta and no significant plaque or stenosis involving the mesenteric arteries. Bilateral renal cysts as described. No acute abnormalities in the abdomen or pelvis. Electronically Signed   By: Markus Daft M.D.   On: 01/25/2016 16:39    ROS: See chart Blood pressure 130/68, pulse 72, temperature 98.4 F (36.9 C), temperature source Oral, resp. rate 20, height 5' 8"  (1.727 m), weight 74.844 kg (165 lb), SpO2 97 %. Physical Exam: Pleasant black male in no acute distress. Abdomen is soft, nontender, nondistended. No hepatosplenomegaly, masses, hernias are identified.  Assessment/Plan: Impression: Positive hepatobiliary scan consistent with cholecystitis Plan: Patient be taken to the operating room today for laparoscopic cholecystectomy. Consent will need to be signed by power of attorney.  Aften Lipsey A 01/26/2016, 7:11 AM

## 2016-01-26 NOTE — Anesthesia Procedure Notes (Signed)
Procedure Name: Intubation Date/Time: 01/26/2016 2:03 PM Performed by: Despina Hidden Pre-anesthesia Checklist: Patient identified, Emergency Drugs available, Suction available and Patient being monitored Patient Re-evaluated:Patient Re-evaluated prior to inductionOxygen Delivery Method: Circle system utilized Preoxygenation: Pre-oxygenation with 100% oxygen Intubation Type: IV induction, Rapid sequence and Cricoid Pressure applied Laryngoscope Size: Mac and 3 Grade View: Grade II Tube type: Oral Tube size: 8.0 mm Number of attempts: 1 Airway Equipment and Method: Stylet Placement Confirmation: ETT inserted through vocal cords under direct vision,  breath sounds checked- equal and bilateral and positive ETCO2 Secured at: 22 cm Tube secured with: Tape Dental Injury: Teeth and Oropharynx as per pre-operative assessment  Difficulty Due To: Difficult Airway- due to dentition and Difficulty was anticipated

## 2016-01-26 NOTE — Telephone Encounter (Signed)
Noted. Appreciate all the help. So glad the source for this nice gentleman's symptoms have been found. Has been somewhat of a challenging case due to limited information provided by patient historically (mentally challenged).

## 2016-01-26 NOTE — Anesthesia Postprocedure Evaluation (Signed)
Anesthesia Post Note  Patient: Kyle Wade  Procedure(s) Performed: Procedure(s) (LRB): LAPAROSCOPIC CHOLECYSTECTOMY (N/A)  Patient location during evaluation: PACU Anesthesia Type: General Level of consciousness: awake and patient cooperative Pain management: pain level controlled Vital Signs Assessment: post-procedure vital signs reviewed and stable Respiratory status: spontaneous breathing and patient connected to nasal cannula oxygen Cardiovascular status: blood pressure returned to baseline and stable Postop Assessment: no signs of nausea or vomiting Anesthetic complications: no    Last Vitals:  Filed Vitals:   01/26/16 1345 01/26/16 1507  BP: 132/83 142/86  Pulse:    Temp:  36.6 C  Resp: 11     Last Pain:  Filed Vitals:   01/26/16 1528  PainSc: Asleep                 Damica Gravlin J

## 2016-01-26 NOTE — Anesthesia Preprocedure Evaluation (Addendum)
Anesthesia Evaluation  Patient identified by MRN, date of birth, ID band Patient awake    Reviewed: Allergy & Precautions, NPO status , Patient's Chart, lab work & pertinent test results, reviewed documented beta blocker date and time   Airway Mallampati: III  TM Distance: >3 FB     Dental  (+) Missing, Poor Dentition, Chipped,    Pulmonary sleep apnea , Current Smoker,    Pulmonary exam normal        Cardiovascular hypertension, Pt. on medications and Pt. on home beta blockers Normal cardiovascular exam     Neuro/Psych Schizophrenia    GI/Hepatic GERD  Medicated and Controlled,  Endo/Other    Renal/GU      Musculoskeletal   Abdominal Normal abdominal exam  (+)   Peds  (+) mental retardation and Neurological problem Hematology   Anesthesia Other Findings   Reproductive/Obstetrics                            Anesthesia Physical Anesthesia Plan  ASA: III  Anesthesia Plan: General   Post-op Pain Management:    Induction: Intravenous  Airway Management Planned: Oral ETT  Additional Equipment:   Intra-op Plan:   Post-operative Plan: Extubation in OR  Informed Consent: I have reviewed the patients History and Physical, chart, labs and discussed the procedure including the risks, benefits and alternatives for the proposed anesthesia with the patient or authorized representative who has indicated his/her understanding and acceptance.   Consent reviewed with POA and History available from chart only  Plan Discussed with: CRNA  Anesthesia Plan Comments:         Anesthesia Quick Evaluation

## 2016-01-26 NOTE — Progress Notes (Signed)
Patient briefly seen and examined today prior to cholecystectomy. He is feeling well other than some mild right upper quadrant pain. Not able to fully answer questions given his history of schizophrenia and mental retardation. Patient will be transferred to Dr. Lovell Sheehan service following surgery. We will continue to be available if needed for assistance.  Peggye Pitt, MD [facility] Pager: [phone]

## 2016-01-26 NOTE — Op Note (Signed)
Patient:  Kyle Wade  DOB:  04/18/1956  MRN:  [id number]   Preop Diagnosis:   cholecystitis  Postop Diagnosis:   Same, cholelithiasis  Procedure:   Laparoscopic cholecystectomy  Surgeon:   [name], M.D.  Anes:   General endotracheal  Indications:   Patient is a 60 year old black male with schizophrenia who was found on outpatient hepatobiliary scan to have a non- visualized gallbladder. He has had intermittent abdominal pain for many months. He now presents laparoscopic cholecystectomy. The risks and benefits of the procedure including bleeding, infection, hepatobiliary injury, and the possibility of an open procedure were explained to the patient's power of attorney, who gave informed consent.  Procedure note:   The patient was placed the supine position. After induction of general endotracheal anesthesia, the abdomen was prepped and draped using usual sterile technique with DuraPrep. Surgical site confirmation was performed.   a supraumbilical incision was made down to the fascia. A Veress needle was introduced into the abdominal cavity and confirmation of placement was done using the saline drop test. The abdomen was then insufflated to 16 mmHg pressure. 11 mm trocar was introduced into the abdominal cavity under direct visualization without difficulty. The patient was placed in reverse Trendelenburg position and an additional 11 mm trocar was placed in the epigastric region 5 mm trochars were placed the right upper quadrant and right flank regions. Liver was inspected and noted within normal limits. The gallbladder was noted to be contracted with multiple stones present and a thickened gallbladder wall. The gallbladder was retracted in a dynamic fashion in order to provide a critical view of the triangle of Calot. A dome down approach was used in order to facilitate exposure of the triangle of Calot. The cystic duct was first identified. Its juncture to the infundibulum was fully  identified. An Endo GIA was placed across the cystic duct and fired. The cystic artery was ligated and divided using endoclips. The gallbladder was freed away from the gallbladder fossa using Bovie electrocautery. The gallbladder was delivered through the epigastric trocar site using an Endo Catch bag. The gallbladder fossa was inspected no abnormal bleeding or bile leakage was noted. Surgicel was placed the gallbladder fossa. All fluid and air evacuated from the abdominal cavity prior to removal of the trochars.  All wounds were irrigated with normal saline. All wounds were injected with 0.5% Sensorcaine. The supraumbilical fascia as well as epigastric fascia reapproximated using 0 Vicryl interrupted sutures. All skin incisions were closed using staples. Betadine ointment and dry sterile dressings were applied.  All tape and needle counts were correct at the end of the procedure. Patient was extubated in the operating room and transferred to PACU in stable condition.  Complications:   none  EBL:   minimal  Specimen:   gallbladder

## 2016-01-26 NOTE — Progress Notes (Signed)
Initial Nutrition Assessment  DOCUMENTATION CODES:  Not applicable  INTERVENTION:  Ensure Enlive po BID, each supplement provides 350 kcal and 20 grams of protein  NUTRITION DIAGNOSIS:  Increased nutrient needs related to surgical wound healing as evidenced by estimated nutritional requirements for the condition  GOAL:  Patient will meet greater than or equal to 90% of their needs  MONITOR:  PO intake, Supplement acceptance, Diet advancement  REASON FOR ASSESSMENT:  Malnutrition Screening Tool    ASSESSMENT:  60 y/o male PMHx HTN, Schizophrenia, Mental retardation, HTN, GERD. Presents for laparoscopic cholecystectomy due to acute cholecystitis. Has history of post prandial abdominal pains for several months.   On RD arrival, pt had just returned from PACU and was still fairly sedated. Unable to conversate.  Pt says hes likes Ensure. Will order to promote healing.   NFPE: WDL  Labs: Few abnormalities.   Diet Order:  Diet regular Room service appropriate?: Yes; Fluid consistency:: Thin  Skin:  Reviewed, no issues  Last BM:  2/10  Height:  Ht Readings from Last 1 Encounters:  01/25/16 5\' 8"  (1.727 m)   Weight:  Wt Readings from Last 1 Encounters:  01/25/16 165 lb (74.844 kg)   Wt Readings from Last 10 Encounters:  01/25/16 165 lb (74.844 kg)  01/24/16 166 lb 12.8 oz (75.66 kg)  01/03/16 165 lb 9.6 oz (75.116 kg)  10/17/15 170 lb 12.8 oz (77.474 kg)  08/17/15 175 lb (79.379 kg)  06/16/15 178 lb (80.74 kg)  09/21/14 198 lb (89.812 kg)  08/29/14 197 lb 9.6 oz (89.631 kg)  05/02/14 199 lb (90.266 kg)   Ideal Body Weight:  70 kg  BMI:  Body mass index is 25.09 kg/(m^2).  Estimated Nutritional Needs:  Kcal:  1800-2000 (24-27 kcal/kg bw) Protein:  75-90 g Pro (1-1.2 g/kg bw) Fluid:  1.8-2 liters  EDUCATION NEEDS:  No education needs identified at this time  Christophe Louis RD, LDN Nutrition Pager: [phone] 01/26/2016 5:12 PM

## 2016-01-26 NOTE — Progress Notes (Signed)
Consent for surgery signed by Director Representative Guardianship Social Worker [name]. Consent in pt chart.

## 2016-01-26 NOTE — Care Management Obs Status (Signed)
MEDICARE OBSERVATION STATUS NOTIFICATION   Patient Details  Name: Kyle Wade MRN: [id number] Date of Birth: 22-Mar-1956   Medicare Observation Status Notification Given:  Yes    Adonis Huguenin, RN 01/26/2016, 9:29 AM

## 2016-01-26 NOTE — Transfer of Care (Signed)
Immediate Anesthesia Transfer of Care Note  Patient: Kyle Wade  Procedure(s) Performed: Procedure(s): LAPAROSCOPIC CHOLECYSTECTOMY (N/A)  Patient Location: PACU  Anesthesia Type:General  Level of Consciousness: sedated  Airway & Oxygen Therapy: Patient Spontanous Breathing and Patient connected to face mask oxygen  Post-op Assessment: Report given to RN and Post -op Vital signs reviewed and stable  Post vital signs: Reviewed and stable  Last Vitals:  Filed Vitals:   01/26/16 1340 01/26/16 1345  BP: 140/84 132/83  Pulse:    Temp:    Resp: 10 11    Complications: No apparent anesthesia complications

## 2016-01-27 DIAGNOSIS — K8010 Calculus of gallbladder with chronic cholecystitis without obstruction: Secondary | ICD-10-CM | POA: Diagnosis not present

## 2016-01-27 LAB — BASIC METABOLIC PANEL
Anion gap: 8 (ref 5–15)
BUN: 18 mg/dL (ref 6–20)
CALCIUM: 8.4 mg/dL — AB (ref 8.9–10.3)
CO2: 28 mmol/L (ref 22–32)
CREATININE: 1.26 mg/dL — AB (ref 0.61–1.24)
Chloride: 99 mmol/L — ABNORMAL LOW (ref 101–111)
GLUCOSE: 132 mg/dL — AB (ref 65–99)
Potassium: 3.9 mmol/L (ref 3.5–5.1)
SODIUM: 135 mmol/L (ref 135–145)

## 2016-01-27 LAB — CBC
HEMATOCRIT: 36.4 % — AB (ref 39.0–52.0)
Hemoglobin: 12.9 g/dL — ABNORMAL LOW (ref 13.0–17.0)
MCH: 31.5 pg (ref 26.0–34.0)
MCHC: 35.4 g/dL (ref 30.0–36.0)
MCV: 89 fL (ref 78.0–100.0)
Platelets: 162 10*3/uL (ref 150–400)
RBC: 4.09 MIL/uL — ABNORMAL LOW (ref 4.22–5.81)
RDW: 12 % (ref 11.5–15.5)
WBC: 5.8 10*3/uL (ref 4.0–10.5)

## 2016-01-27 LAB — MAGNESIUM: Magnesium: 1.8 mg/dL (ref 1.7–2.4)

## 2016-01-27 LAB — PHOSPHORUS: Phosphorus: 3 mg/dL (ref 2.5–4.6)

## 2016-01-27 MED ORDER — HYDROCODONE-ACETAMINOPHEN 5-325 MG PO TABS: 1.0000 | ORAL_TABLET | Freq: Four times a day (QID) | ORAL | Status: DC | PRN

## 2016-01-27 NOTE — Anesthesia Postprocedure Evaluation (Signed)
Anesthesia Post Note  Patient: Kyle Wade  Procedure(s) Performed: Procedure(s) (LRB): LAPAROSCOPIC CHOLECYSTECTOMY (N/A)  Patient location during evaluation: Nursing Unit Anesthesia Type: General Level of consciousness: awake and patient cooperative Pain management: pain level controlled Vital Signs Assessment: post-procedure vital signs reviewed and stable Respiratory status: spontaneous breathing and nonlabored ventilation Cardiovascular status: blood pressure returned to baseline and stable Postop Assessment: no signs of nausea or vomiting Anesthetic complications: no    Last Vitals:  Filed Vitals:   01/26/16 2248 01/27/16 0642  BP: 108/56 107/64  Pulse: 78 80  Temp: 36.6 C 36.3 C  Resp: 16 16    Last Pain:  Filed Vitals:   01/27/16 0643  PainSc: 0-No pain                 Elexis Pollak J

## 2016-01-27 NOTE — Discharge Instructions (Signed)
May take bandages off Sunday 01/28/16 and shower  Laparoscopic Cholecystectomy, Care After Refer to this sheet in the next few weeks. These instructions provide you with information about caring for yourself after your procedure. Your health care provider may also give you more specific instructions. Your treatment has been planned according to current medical practices, but problems sometimes occur. Call your health care provider if you have any problems or questions after your procedure. WHAT TO EXPECT AFTER THE PROCEDURE After your procedure, it is common to have:  Pain at your incision sites. You will be given pain medicines to control your pain.  Mild nausea or vomiting. This should improve after the first 24 hours.  Bloating and possible shoulder pain from the gas that was used during the procedure. This will improve after the first 24 hours. HOME CARE INSTRUCTIONS Incision Care  Follow instructions from your health care provider about how to take care of your incisions. Make sure you:  Wash your hands with soap and water before you change your bandage (dressing). If soap and water are not available, use hand sanitizer.  Change your dressing as told by your health care provider.  Leave stitches (sutures), skin glue, or adhesive strips in place. These skin closures may need to be in place for 2 weeks or longer. If adhesive strip edges start to loosen and curl up, you may trim the loose edges. Do not remove adhesive strips completely unless your health care provider tells you to do that.  Do not take baths, swim, or use a hot tub until your health care provider approves. Ask your health care provider if you can take showers. You may only be allowed to take sponge baths for bathing. General Instructions  Take over-the-counter and prescription medicines only as told by your health care provider.  Do not drive or operate heavy machinery while taking prescription pain medicine.  Return  to your normal diet as told by your health care provider.  Do not lift anything that is heavier than 10 lb (4.5 kg).  Do not play contact sports for one week or until your health care provider approves. SEEK MEDICAL CARE IF:   You have redness, swelling, or pain at the site of your incision.  You have fluid, blood, or pus coming from your incision.  You notice a bad smell coming from your incision area.  Your surgical incisions break open.  You have a fever. SEEK IMMEDIATE MEDICAL CARE IF:  You develop a rash.  You have difficulty breathing.  You have chest pain.  You have increasing pain in your shoulders (shoulder strap areas).  You faint or have dizzy episodes while you are standing.  You have severe pain in your abdomen.  You have nausea or vomiting that lasts for more than one day.   This information is not intended to replace advice given to you by your health care provider. Make sure you discuss any questions you have with your health care provider.   Document Released: 12/02/2005 Document Revised: 08/23/2015 Document Reviewed: 07/14/2013 Elsevier Interactive Patient Education [employer]

## 2016-01-27 NOTE — Progress Notes (Signed)
CSW notified that patient patient is from [facility] and is medically stable for d/c today back to facility.  Fl2 completed and placed in chart for MD's signature and discussed with charge nurse at [facility]. She stated that facility came to pick patient up this morning but could not accept him until they had signed Fl2.  They plan to come this afternoon to pick him up.  No further CSW needs identified. CSW signing off. Kyle Wade. Jaci Lazier, [place] [phone] (weekend coverage) \

## 2016-01-27 NOTE — Progress Notes (Signed)
Patient discharged with instructions, prescription, and care notes.  Verbalized understanding via teach back.  IV was removed and the site was WNL. Patient voiced no further complaints or concerns at the time of discharge.  Appointments scheduled per instructions.  Patient left the floor via w/c with staff and family in stable condition.  Packet was given to the receiving staff to take back to the facility for the responsible party.

## 2016-01-27 NOTE — Addendum Note (Signed)
Addendum  created 01/27/16 1015 by Despina Hidden, CRNA   Modules edited: Clinical Notes   Clinical Notes:  File: [id number]

## 2016-01-27 NOTE — Discharge Summary (Signed)
Physician Discharge Summary  Patient ID: Kyle Wade MRN: [id number] DOB/AGE: [id number] 60 y.o.  Admit date: 01/25/2016 Discharge date: 01/27/2016  Admission Diagnoses: Cholecystitis  Discharge Diagnoses: Cholecystitis, cholelithiasis Active Problems:   Schizophrenia (HCC)   Cholecystitis   Mental retardation   Essential hypertension   GERD (gastroesophageal reflux disease)   Incontinence   Discharged Condition: good  Hospital Course: Patient is a 60 year old black male who lives in a group home due to mental retardation and schizophrenia who was found on outpatient hepatobiliary scan to have nonfilling of the gallbladder. He was brought to the emergency room for further evaluation and treatment. He subsequently underwent laparoscopic cholecystectomy on 01/26/2016. He tolerated the surgery well. His postoperative course has been unremarkable. His diet was advanced without difficulty. The patient is being discharged back to the group home in good and improving condition.  Treatments: surgery: Laparoscopic cholecystectomy on 01/26/2016  Discharge Exam: Blood pressure 107/64, pulse 80, temperature 97.4 F (36.3 C), temperature source Oral, resp. rate 16, height  (1.727 m), weight 74.844 kg (165 lb), SpO2 100 %. General appearance: alert, cooperative and no distress Resp: clear to auscultation bilaterally Cardio: regular rate and rhythm, S1, S2 normal, no murmur, click, rub or gallop GI: Soft, dressings dry and intact.  Disposition: 06-Home-Health Care Svc     Medication List    TAKE these medications        amLODipine 5 MG tablet  Commonly known as:  NORVASC  Take 5 mg by mouth every morning.     buPROPion 150 MG 12 hr tablet  Commonly known as:  WELLBUTRIN SR  Take 150 mg by mouth 2 (two) times daily.     cyclobenzaprine 10 MG tablet  Commonly known as:  FLEXERIL  Take 10 mg by mouth at bedtime.     docusate sodium 100 MG capsule  Commonly known as:  COLACE   Take 100 mg by mouth 2 (two) times daily.     famotidine 40 MG tablet  Commonly known as:  PEPCID  Take 40 mg by mouth at bedtime.     fluticasone 50 MCG/ACT nasal spray  Commonly known as:  FLONASE  Place 2 sprays into both nostrils daily.     gemfibrozil 600 MG tablet  Commonly known as:  LOPID  Take 600 mg by mouth 2 (two) times daily.     HYDROcodone-acetaminophen 5-325 MG tablet  Commonly known as:  NORCO/VICODIN  Take 1-2 tablets by mouth every 6 (six) hours as needed for moderate pain.     metoprolol 50 MG tablet  Commonly known as:  LOPRESSOR  Take 25 mg by mouth 2 (two) times daily.     omeprazole 20 MG capsule  Commonly known as:  PRILOSEC  Take 20 mg by mouth daily before breakfast.     ondansetron 4 MG tablet  Commonly known as:  ZOFRAN  Take 1 tablet (4 mg total) by mouth every 8 (eight) hours as needed for nausea or vomiting.     oxybutynin 5 MG tablet  Commonly known as:  DITROPAN  Take 5 mg by mouth every morning.     potassium chloride 10 MEQ CR capsule  Commonly known as:  MICRO-K  Take 10 mEq by mouth 2 (two) times daily.     tamsulosin 0.4 MG Caps capsule  Commonly known as:  FLOMAX  Take 0.4 mg by mouth every evening.     thioridazine 25 MG tablet  Commonly known as:  MELLARIL  Take  25 mg by mouth 2 (two) times daily.     triamterene-hydrochlorothiazide 37.5-25 MG tablet  Commonly known as:  MAXZIDE-25  Take 1 tablet by mouth every morning.     Vitamin D (Ergocalciferol) 50000 units Caps capsule  Commonly known as:  DRISDOL  Take 50,000 Units by mouth every 14 (fourteen) days.           Follow-up Information    Follow up with Dalia Heading, MD. Schedule an appointment as soon as possible for a visit on 02/06/2016.   Specialty:  General Surgery   Contact information:   [address] [place] [address] [phone]       Signed: [name] A 01/27/2016, 10:34 AM

## 2016-01-27 NOTE — NC FL2 (Signed)
[place] MEDICAID FL2 LEVEL OF CARE SCREENING TOOL     IDENTIFICATION  Patient Name: Kyle Wade Birthdate: 02-May-1956 Sex: male Admission Date (Current Location): 01/25/2016  [place] and [place] Number:  Aaron Edelman [id number] K Facility and Address:  [facility],  [address] [place] [address]      Provider Number: [id number]  Attending Physician Name and Address:  [name], MD  Relative Name and Phone Number:       Current Level of Care: Hospital Recommended Level of Care: Family Care Home ([facility]) Prior Approval Number:    Date Approved/Denied:   PASRR Number:    Discharge Plan: Domiciliary (Rest home)    Current Diagnoses: Patient Active Problem List   Diagnosis Date Noted  . Cholecystitis 01/25/2016  . Mental retardation 01/25/2016  . Essential hypertension 01/25/2016  . GERD (gastroesophageal reflux disease) 01/25/2016  . Incontinence 01/25/2016  . Nausea and vomiting 01/03/2016  . Imbalance 10/17/2015  . Loss of weight 10/17/2015  . Dysphagia 09/01/2014  . Encounter for screening colonoscopy 09/01/2014  . Elevated LFTs 08/29/2014  . Dyspepsia 08/29/2014  . Schizophrenia (HCC) 05/02/2014  . High blood pressure 05/02/2014    Orientation RESPIRATION BLADDER Height & Weight     Self, Place  Normal Incontinent Weight: 165 lb (74.844 kg) Height:   (172.7 cm)  BEHAVIORAL SYMPTOMS/MOOD NEUROLOGICAL BOWEL NUTRITION STATUS      Incontinent Diet (Regular)  AMBULATORY STATUS COMMUNICATION OF NEEDS Skin   Limited Assist Verbally Surgical wounds (Lap Chol incision sites)                       Personal Care Assistance Level of Assistance  Dressing, Bathing Bathing Assistance: Limited assistance   Dressing Assistance: Limited assistance     Functional Limitations Info             SPECIAL CARE FACTORS FREQUENCY                       Contractures Contractures Info: Not present    Additional Factors  Info  Code Status, Allergies Code Status Info: Full Allergies Info: NKA           Current Medications (01/27/2016):  This is the current hospital active medication list Current Facility-Administered Medications  Medication Dose Route Frequency Provider Last Rate Last Dose  . 0.9 %  sodium chloride infusion  250 mL Intravenous PRN Ozella Rocks, MD 10 mL/hr at 01/26/16 1109 250 mL at 01/26/16 1109  . acetaminophen (TYLENOL) tablet 650 mg  650 mg Oral Q6H PRN [name], MD       Or  . acetaminophen (TYLENOL) suppository 650 mg  650 mg Rectal Q6H PRN [name], MD      . amLODipine (NORVASC) tablet 5 mg  5 mg Oral q morning - 10a Ozella Rocks, MD   5 mg at 01/27/16 4401  . buPROPion [facility] SR) 12 hr tablet 150 mg  150 mg Oral BID Ozella Rocks, MD   150 mg at 01/27/16 0272  . cyclobenzaprine (FLEXERIL) tablet 10 mg  10 mg Oral QHS Ozella Rocks, MD   10 mg at 01/26/16 2015  . enoxaparin (LOVENOX) injection 40 mg  40 mg Subcutaneous Q24H [name], MD   40 mg at 01/27/16 5366  . feeding supplement (ENSURE ENLIVE) (ENSURE ENLIVE) liquid 237 mL  237 mL Oral BID BM [name], MD   237 mL at  01/27/16 0300  . fluticasone (FLONASE) 50 MCG/ACT nasal spray 2 spray  2 spray Each Nare Daily [name], MD   2 spray at 01/27/16 [phone]  . haloperidol lactate (HALDOL) injection 1 mg  1 mg Intravenous Q6H PRN [name], MD      . hydrALAZINE (APRESOLINE) injection 5-10 mg  5-10 mg Intravenous Q4H PRN Ozella Rocks, MD      . HYDROmorphone (DILAUDID) injection 1 mg  1 mg Intravenous Q2H PRN [name], MD      . lactated ringers infusion   Intravenous Continuous [name], MD 75 mL/hr at 01/27/16 0600    . metoprolol tartrate (LOPRESSOR) tablet 25 mg  25 mg Oral BID Ozella Rocks, MD   25 mg at 01/27/16 0076  . ondansetron (ZOFRAN) tablet 4 mg  4 mg Oral Q6H PRN Ozella Rocks, MD       Or  . ondansetron [facility]) injection 4 mg  4 mg Intravenous Q6H PRN Ozella Rocks,  MD      . oxybutynin [facility]) tablet 5 mg  5 mg Oral q morning - 10a Ozella Rocks, MD   5 mg at 01/27/16 [phone]  . oxyCODONE-acetaminophen (PERCOCET/ROXICET) 5-325 MG per tablet 1-2 tablet  1-2 tablet Oral Q4H PRN [name], MD   1 tablet at 01/27/16 0951  . pantoprazole (PROTONIX) EC tablet 40 mg  40 mg Oral Daily Ozella Rocks, MD   40 mg at 01/27/16 3354  . sodium chloride flush (NS) 0.9 % injection 3 mL  3 mL Intravenous Q12H Ozella Rocks, MD   3 mL at 01/27/16 0953  . tamsulosin (FLOMAX) capsule 0.4 mg  0.4 mg Oral QPM Ozella Rocks, MD   0.4 mg at 01/26/16 1724  . thioridazine (MELLARIL) tablet 25 mg  25 mg Oral BID Ozella Rocks, MD   25 mg at 01/27/16 5625  . triamterene-hydrochlorothiazide (MAXZIDE-25) 37.5-25 MG per tablet 1 tablet  1 tablet Oral q morning - 10a Ozella Rocks, MD   1 tablet at 01/27/16 6389     Discharge Medications: Please see discharge summary for a list of discharge medications.  Relevant Imaging Results:  Relevant Lab Results:   Additional Information SSN: [id number], Lorri [place], [place]

## 2016-01-29 ENCOUNTER — Encounter (HOSPITAL_COMMUNITY): Payer: Self-pay | Admitting: General Surgery

## 2016-01-31 ENCOUNTER — Ambulatory Visit (HOSPITAL_COMMUNITY): Payer: Medicare Other

## 2016-01-31 DIAGNOSIS — B351 Tinea unguium: Secondary | ICD-10-CM | POA: Diagnosis not present

## 2016-01-31 DIAGNOSIS — L110 Acquired keratosis follicularis: Secondary | ICD-10-CM | POA: Diagnosis not present

## 2016-01-31 DIAGNOSIS — I739 Peripheral vascular disease, unspecified: Secondary | ICD-10-CM | POA: Diagnosis not present

## 2016-02-06 DIAGNOSIS — R350 Frequency of micturition: Secondary | ICD-10-CM | POA: Diagnosis not present

## 2016-02-06 DIAGNOSIS — R39198 Other difficulties with micturition: Secondary | ICD-10-CM | POA: Diagnosis not present

## 2016-02-21 ENCOUNTER — Ambulatory Visit (INDEPENDENT_AMBULATORY_CARE_PROVIDER_SITE_OTHER): Payer: Medicare Other | Admitting: Gastroenterology

## 2016-02-21 ENCOUNTER — Encounter: Payer: Self-pay | Admitting: Gastroenterology

## 2016-02-21 VITALS — BP 114/71 | HR 66 | Temp 98.5°F | Ht 68.0 in | Wt 165.6 lb

## 2016-02-21 DIAGNOSIS — R1013 Epigastric pain: Secondary | ICD-10-CM | POA: Diagnosis not present

## 2016-02-21 NOTE — Progress Notes (Signed)
cc'ed to pcp °

## 2016-02-21 NOTE — Progress Notes (Signed)
Referring Provider: [name]., MD Primary Care Physician:  [name], MD  Primary GI: [name]   Chief Complaint  Patient presents with  . Follow-up    HPI:   Kyle Wade is a 60 y.o. male presenting today with a history of weight loss, dyspepsia, and an extensive evaluation as outlined in visit from 01/24/16. Due to his cognitive status (mentally challenged), it was quite difficult to obtain a complete history. Ultimately, a HIDA scan was performed 01/25/16 that showed acute cholecystitis. CTA had also been ordered, which was negative for mesenteric ischemia. He ultimately had a cholecystectomy 01/26/16 by Dr. Lovell Sheehan. Returns today with weight overall stable. No abdominal pain, N/V.  Appetite is good. No constipation or diarrhea. No rectal bleeding. Doing quite well. Scared we will send him back to the hospital.   Past Medical History  Diagnosis Date  . Hypertension   . Schizophrenia (HCC)   . Mental retardation   . Chronic vomiting   . OSA (obstructive sleep apnea)     no cpap machine  . Schatzki's ring   . Acute cholecystitis 01/25/2016    Past Surgical History  Procedure Laterality Date  . None    . Colonoscopy N/A 09/21/2014    [name]: right-sided diverticulosis, internal hemorrhoids. Repeat in 10 years.   . Esophagogastroduodenoscopy N/A 09/21/2014    [name]: Subtle non-critical Schatzki's ring s/p 66 F dilation, query occult cervical esophageal web. Hiatal hernia. Question gastroparesis.   Gaspar Bidding dilation N/A 09/21/2014    Procedure: SAVORY DILATION;  Surgeon: [name], MD;  Location: [facility];  Service: Endoscopy;  Laterality: N/A;  Elease Hashimoto dilation N/A 09/21/2014    Procedure: Elease Hashimoto DILATION;  Surgeon: [name], MD;  Location: [facility];  Service: Endoscopy;  Laterality: N/A;  . Cholecystectomy N/A 01/26/2016    Procedure: LAPAROSCOPIC CHOLECYSTECTOMY;  Surgeon: [name], MD;  Location: [facility] ORS;  Service: General;   Laterality: N/A;    Current Outpatient Prescriptions  Medication Sig Dispense Refill  . amLODipine (NORVASC) 5 MG tablet Take 5 mg by mouth every morning.    [name] buPROPion (WELLBUTRIN SR) 150 MG 12 hr tablet Take 150 mg by mouth 2 (two) times daily.    . cyclobenzaprine (FLEXERIL) 10 MG tablet Take 10 mg by mouth at bedtime.    . docusate sodium (COLACE) 100 MG capsule Take 100 mg by mouth 2 (two) times daily.    . famotidine (PEPCID) 40 MG tablet Take 40 mg by mouth at bedtime.    . fluticasone (FLONASE) 50 MCG/ACT nasal spray Place 2 sprays into both nostrils daily.    [name] gemfibrozil (LOPID) 600 MG tablet Take 600 mg by mouth 2 (two) times daily.    . metoprolol (LOPRESSOR) 50 MG tablet Take 25 mg by mouth 2 (two) times daily.     [name] omeprazole (PRILOSEC) 20 MG capsule Take 20 mg by mouth daily before breakfast.    . ondansetron (ZOFRAN) 4 MG tablet Take 1 tablet (4 mg total) by mouth every 8 (eight) hours as needed for nausea or vomiting. 30 tablet 1  . potassium chloride (MICRO-K) 10 MEQ CR capsule Take 10 mEq by mouth 2 (two) times daily.    . tamsulosin (FLOMAX) 0.4 MG CAPS capsule Take 0.4 mg by mouth every evening.     . thioridazine (MELLARIL) 25 MG tablet Take 25 mg by mouth 2 (two) times daily.     [name] triamterene-hydrochlorothiazide (MAXZIDE-25)  37.5-25 MG per tablet Take 1 tablet by mouth every morning.    . Vitamin D, Ergocalciferol, (DRISDOL) 50000 UNITS CAPS capsule Take 50,000 Units by mouth every 14 (fourteen) days.    [name] HYDROcodone-acetaminophen (NORCO/VICODIN) 5-325 MG tablet Take 1-2 tablets by mouth every 6 (six) hours as needed for moderate pain. (Patient not taking: Reported on 02/21/2016) 50 tablet 0  . oxybutynin (DITROPAN) 5 MG tablet Take 5 mg by mouth every morning. Reported on 02/21/2016     No current facility-administered medications for this visit.    Allergies as of 02/21/2016  . (No Known Allergies)    Family History  Problem Relation Age of Onset  . Colon  cancer      unknown    Social History   Social History  . Marital Status: Single    Spouse Name: N/A  . Number of Children: N/A  . Years of Education: N/A   Social History Main Topics  . Smoking status: Current Every Day Smoker -- 0.50 packs/day    Types: Cigarettes  . Smokeless tobacco: None     Comment: 4 cigarettes a day  . Alcohol Use: No  . Drug Use: No  . Sexual Activity: Not Asked   Other Topics Concern  . None   Social History Narrative    Review of Systems: Negative unless mentioned in HPI   Physical Exam: BP 114/71 mmHg  Pulse 66  Temp(Src) 98.5 F (36.9 C) (Oral)  Ht  (1.727 m)  Wt 165 lb 9.6 oz (75.116 kg)  BMI 25.19 kg/m2 General:   Alert and oriented. No distress noted. Pleasant and cooperative.  Head:  Normocephalic and atraumatic. Eyes:  Conjuctiva clear without scleral icterus. Mouth:  Oral mucosa pink and moist. Good dentition. No lesions. Abdomen:  +BS, soft, non-tender and non-distended. No rebound or guarding. No HSM or masses noted. Msk:  Symmetrical without gross deformities. Normal posture. Extremities:  Without edema. Neurologic:  Alert and  oriented x4;  grossly normal neurologically. Psych:  Alert and cooperative. Normal mood and affect.

## 2016-02-21 NOTE — Assessment & Plan Note (Signed)
After thorough work-up found to have cholecystitis. Doing well post cholecystectomy. Weight stable. Return in 6 months.

## 2016-02-21 NOTE — Patient Instructions (Signed)
I am SO GLAD you are doing well!  I will see you in 6 months!

## 2016-02-22 DIAGNOSIS — F209 Schizophrenia, unspecified: Secondary | ICD-10-CM | POA: Diagnosis not present

## 2016-03-05 DIAGNOSIS — R634 Abnormal weight loss: Secondary | ICD-10-CM | POA: Diagnosis not present

## 2016-03-05 DIAGNOSIS — I10 Essential (primary) hypertension: Secondary | ICD-10-CM | POA: Diagnosis not present

## 2016-04-17 DIAGNOSIS — I739 Peripheral vascular disease, unspecified: Secondary | ICD-10-CM | POA: Diagnosis not present

## 2016-04-17 DIAGNOSIS — L110 Acquired keratosis follicularis: Secondary | ICD-10-CM | POA: Diagnosis not present

## 2016-04-17 DIAGNOSIS — B351 Tinea unguium: Secondary | ICD-10-CM | POA: Diagnosis not present

## 2016-05-24 DIAGNOSIS — F209 Schizophrenia, unspecified: Secondary | ICD-10-CM | POA: Diagnosis not present

## 2016-07-03 DIAGNOSIS — Z6824 Body mass index (BMI) 24.0-24.9, adult: Secondary | ICD-10-CM | POA: Diagnosis not present

## 2016-07-03 DIAGNOSIS — R634 Abnormal weight loss: Secondary | ICD-10-CM | POA: Diagnosis not present

## 2016-07-03 DIAGNOSIS — G4700 Insomnia, unspecified: Secondary | ICD-10-CM | POA: Diagnosis not present

## 2016-07-10 DIAGNOSIS — I739 Peripheral vascular disease, unspecified: Secondary | ICD-10-CM | POA: Diagnosis not present

## 2016-07-10 DIAGNOSIS — L110 Acquired keratosis follicularis: Secondary | ICD-10-CM | POA: Diagnosis not present

## 2016-07-10 DIAGNOSIS — B351 Tinea unguium: Secondary | ICD-10-CM | POA: Diagnosis not present

## 2016-08-28 ENCOUNTER — Encounter: Payer: Self-pay | Admitting: Gastroenterology

## 2016-08-28 ENCOUNTER — Ambulatory Visit (INDEPENDENT_AMBULATORY_CARE_PROVIDER_SITE_OTHER): Payer: Medicare Other | Admitting: Gastroenterology

## 2016-08-28 VITALS — BP 136/82 | HR 77 | Temp 98.4°F | Ht 68.0 in | Wt 148.4 lb

## 2016-08-28 DIAGNOSIS — R634 Abnormal weight loss: Secondary | ICD-10-CM | POA: Diagnosis not present

## 2016-08-28 NOTE — Assessment & Plan Note (Signed)
20 pounds down in last 6 months, unintentional. Unclear etiology. Denies abdominal pain, N/V. Mentally challenged, difficult case. Prior extensive work-up. TCS/EGD on file from Oct 2015. GES normal in past. CTA negative earlier this year. Cholecystectomy earlier this year. Obtain labs from Dr. Margo Commonapper ASAP. Will order TSH, cortisol level if not previously done. If no recent LFTs, will have those as well. Add boost/ensure. Return in 6 weeks for close follow-up.

## 2016-08-28 NOTE — Progress Notes (Signed)
Referring Provider: [name]., MD Primary Care Physician:  [name], MD  Primary GI: [name]   Chief Complaint  Patient presents with  . Follow-up    HPI:   Kyle Wade is a 60 y.o. male presenting today with a history of weight loss, dyspepsia, and an extensive evaluation as outlined in visit from 01/24/16. Due to his cognitive status (mentally challenged), it was quite difficult to obtain a complete history. Ultimately, a HIDA scan was performed 01/25/16 that showed acute cholecystitis. CTA had also been ordered, which was negative for mesenteric ischemia. He ultimately had a cholecystectomy 01/26/16 by Dr. Lovell Sheehan. He has lost close to 20 lbs since being since 6 months ago.   States he eats really fast. Then states he isn't eating a lot of food. Denies abdominal pain. No vomiting. His caretaker is present with him and states that he eats well, no vomiting, denies any abdominal pain.   Past Medical History:  Diagnosis Date  . Acute cholecystitis 01/25/2016  . Chronic vomiting   . Hypertension   . Mental retardation   . OSA (obstructive sleep apnea)    no cpap machine  . Schatzki's ring   . Schizophrenia [facility])     Past Surgical History:  Procedure Laterality Date  . CHOLECYSTECTOMY N/A 01/26/2016   Procedure: LAPAROSCOPIC CHOLECYSTECTOMY;  Surgeon: [name], MD;  Location: [facility];  Service: General;  Laterality: N/A;  . COLONOSCOPY N/A 09/21/2014   [name]: right-sided diverticulosis, internal hemorrhoids. Repeat in 10 years.   . ESOPHAGOGASTRODUODENOSCOPY N/A 09/21/2014   [name]: Subtle non-critical Schatzki's ring s/p 75 F dilation, query occult cervical esophageal web. Hiatal hernia. Question gastroparesis.   [name] MALONEY DILATION N/A 09/21/2014   Procedure: Elease Hashimoto DILATION;  Surgeon: [name], MD;  [facility];  Service: Endoscopy;  Laterality: N/A;  . None    . SAVORY DILATION N/A 09/21/2014   Procedure: SAVORY DILATION;  Surgeon:  [name], MD;  [facility];  Service: Endoscopy;  Laterality: N/A;    Current Outpatient Prescriptions  Medication Sig Dispense Refill  . amLODipine (NORVASC) 5 MG tablet Take 5 mg by mouth every morning.    [name] buPROPion (WELLBUTRIN SR) 150 MG 12 hr tablet Take 150 mg by mouth 2 (two) times daily.    . cyclobenzaprine (FLEXERIL) 10 MG tablet Take 10 mg by mouth at bedtime.    . docusate sodium (COLACE) 100 MG capsule Take 100 mg by mouth 2 (two) times daily.    . fluticasone (FLONASE) 50 MCG/ACT nasal spray Place 2 sprays into both nostrils daily.    [name] gemfibrozil (LOPID) 600 MG tablet Take 600 mg by mouth 2 (two) times daily.    [name] omeprazole (PRILOSEC) 20 MG capsule Take 20 mg by mouth daily before breakfast.    . oxybutynin (DITROPAN) 5 MG tablet Take 5 mg by mouth every morning. Reported on 02/21/2016    . potassium chloride (MICRO-K) 10 MEQ CR capsule Take 10 mEq by mouth 2 (two) times daily.    . ranitidine (ZANTAC) 300 MG tablet Take 300 mg by mouth at bedtime.    . tamsulosin (FLOMAX) 0.4 MG CAPS capsule Take 0.4 mg by mouth every evening.     . thioridazine (MELLARIL) 50 MG tablet Take 50 mg by mouth 2 (two) times daily.    [name] triamterene-hydrochlorothiazide (MAXZIDE-25) 37.5-25 MG per tablet Take 1 tablet by mouth every morning.    . Vitamin  D, Ergocalciferol, (DRISDOL) 50000 UNITS CAPS capsule Take 50,000 Units by mouth every 14 (fourteen) days.     No current facility-administered medications for this visit.     Allergies as of 08/28/2016  . (No Known Allergies)    Family History  Problem Relation Age of Onset  . Colon cancer      unknown    Social History   Social History  . Marital status: Single    Spouse name: N/A  . Number of children: N/A  . Years of education: N/A   Social History Main Topics  . Smoking status: Current Every Day Smoker    Packs/day: 0.50    Types: Cigarettes  . Smokeless tobacco: None     Comment: 4 cigarettes a day  .  Alcohol use No  . Drug use: No  . Sexual activity: Not Asked   Other Topics Concern  . None   Social History Narrative  . None    Review of Systems: As mentioned in HPI   Physical Exam: BP 136/82   Pulse 77   Temp 98.4 F (36.9 C) (Oral)   Ht 5\' 8"  (1.727 m)   Wt 148 lb 6.4 oz (67.3 kg)   BMI 22.56 kg/m  General:   Alert and oriented. No distress noted. Pleasant and cooperative.  Head:  Normocephalic and atraumatic. Eyes:  Conjuctiva clear without scleral icterus. Abdomen:  +BS, soft, non-tender and non-distended. No rebound or guarding. No HSM or masses noted. Msk:  Symmetrical without gross deformities. Normal posture. Extremities:  Without edema. Neurologic:  Alert and  oriented x4 Psych:  Alert and cooperative. Normal mood and affect.

## 2016-08-28 NOTE — Patient Instructions (Signed)
Start taking Boost or Ensure twice a day.   I will see you in 6 weeks for a weight check.   I am requesting labs from [name]. We will likely need to do more.

## 2016-08-28 NOTE — Progress Notes (Signed)
cc'ed to pcp °

## 2016-09-05 DIAGNOSIS — F209 Schizophrenia, unspecified: Secondary | ICD-10-CM | POA: Diagnosis not present

## 2016-09-06 NOTE — Progress Notes (Signed)
Received outside labs from July 2017. TSH 0.74. Albumin 4.5, Alk Phos 77, AST 22.6, Tbili 0.4, ALT 16, Cr 1.09, BUN 13, WBC low at 2.7, neutrophils low at 1.0, Hgb 14.4, Platelets 170.   Unclear why he has leukopenia. Upon review of his CBC, his white count has been mildly low since Nov of last year, but this was non-specific. Absolute neutrophils mildly low. I wonder if he has some type of evolving hematologic etiology?   Let's do the following. Recheck a CBC now. His last was in July. IF he still has unexplained leukopenia, I would like to refer him to Hematology. He continues to lose weight, and it is unexplained. I am concerned about occult malignancy. We have evaluated him thoroughly to no avail.

## 2016-09-12 ENCOUNTER — Other Ambulatory Visit: Payer: Self-pay | Admitting: Gastroenterology

## 2016-09-12 ENCOUNTER — Other Ambulatory Visit: Payer: Self-pay

## 2016-09-12 DIAGNOSIS — D72819 Decreased white blood cell count, unspecified: Secondary | ICD-10-CM

## 2016-09-12 NOTE — Progress Notes (Signed)
Lab order done. [place] [facility]. They will come by tomorrow morning and pick up the orders and have it done. Orders are at the front desk.

## 2016-09-25 DIAGNOSIS — L110 Acquired keratosis follicularis: Secondary | ICD-10-CM | POA: Diagnosis not present

## 2016-09-25 DIAGNOSIS — I739 Peripheral vascular disease, unspecified: Secondary | ICD-10-CM | POA: Diagnosis not present

## 2016-09-25 DIAGNOSIS — D72819 Decreased white blood cell count, unspecified: Secondary | ICD-10-CM | POA: Diagnosis not present

## 2016-09-25 DIAGNOSIS — B351 Tinea unguium: Secondary | ICD-10-CM | POA: Diagnosis not present

## 2016-09-25 LAB — CBC WITH DIFFERENTIAL/PLATELET
BASOS PCT: 0 %
Basophils Absolute: 0 cells/uL (ref 0–200)
EOS ABS: 64 {cells}/uL (ref 15–500)
Eosinophils Relative: 2 %
HEMATOCRIT: 41.3 % (ref 38.5–50.0)
Hemoglobin: 14.1 g/dL (ref 13.2–17.1)
LYMPHS PCT: 56 %
Lymphs Abs: 1792 cells/uL (ref 850–3900)
MCH: 30.1 pg (ref 27.0–33.0)
MCHC: 34.1 g/dL (ref 32.0–36.0)
MCV: 88.1 fL (ref 80.0–100.0)
MONO ABS: 416 {cells}/uL (ref 200–950)
MONOS PCT: 13 %
MPV: 11.1 fL (ref 7.5–12.5)
NEUTROS ABS: 928 {cells}/uL — AB (ref 1500–7800)
Neutrophils Relative %: 29 %
PLATELETS: 140 10*3/uL (ref 140–400)
RBC: 4.69 MIL/uL (ref 4.20–5.80)
RDW: 12.9 % (ref 11.0–15.0)
WBC: 3.2 10*3/uL — AB (ref 3.8–10.8)

## 2016-10-02 ENCOUNTER — Other Ambulatory Visit: Payer: Self-pay

## 2016-10-02 DIAGNOSIS — D72819 Decreased white blood cell count, unspecified: Secondary | ICD-10-CM

## 2016-10-02 DIAGNOSIS — R634 Abnormal weight loss: Secondary | ICD-10-CM

## 2016-10-02 NOTE — Progress Notes (Signed)
Please refer to Hematology due to persistent leukopenia. This started in Nov 2016. He has unexplained weight loss. Poor historian due to cognitive deficits; we have thoroughly evaluated him from a GI standpoint. I am concerned that something is going on that we can't quite place and we need to rule out any hematologic issue. It is difficult to really get a good history as I mentioned, so please refer to Hematology so we can be thorough. Reason: leukopenia, unintentional weight loss.

## 2016-10-09 DIAGNOSIS — Z23 Encounter for immunization: Secondary | ICD-10-CM | POA: Diagnosis not present

## 2016-10-11 ENCOUNTER — Ambulatory Visit (INDEPENDENT_AMBULATORY_CARE_PROVIDER_SITE_OTHER): Payer: Medicare Other | Admitting: Gastroenterology

## 2016-10-11 ENCOUNTER — Encounter: Payer: Self-pay | Admitting: Gastroenterology

## 2016-10-11 VITALS — BP 127/83 | HR 84 | Temp 98.4°F | Ht 68.0 in | Wt 150.6 lb

## 2016-10-11 DIAGNOSIS — R634 Abnormal weight loss: Secondary | ICD-10-CM

## 2016-10-11 DIAGNOSIS — R1013 Epigastric pain: Secondary | ICD-10-CM

## 2016-10-11 MED ORDER — DEXLANSOPRAZOLE 60 MG PO CPDR: 60.0000 mg | DELAYED_RELEASE_CAPSULE | Freq: Every day | ORAL | 3 refills | Status: DC

## 2016-10-11 NOTE — Patient Instructions (Signed)
Stop Prilosec. Start Dexilant. Take this once each morning. I have provided samples and sent to the pharmacy.  Keep appointment with the hematologist.   I will see you in 3 months!

## 2016-10-11 NOTE — Progress Notes (Signed)
Referring Provider: Louie Bostonapper, David B., MD Primary Care Physician:  Louie BostonAPPER,DAVID B, MD Primary GI: Dr. Jena Gaussourk   Chief Complaint  Patient presents with  . Follow-up    no problems    HPI:   Kyle Wade is a 60 y.o. male presenting today with a history of weight loss, dyspepsia, and an extensive evaluation as outlined in visit from 01/24/16. Due to his cognitive status (mentally challenged), it has been historically difficult to obtain a complete history. Ultimately, a HIDA scan was performed 01/25/16 that showed acute cholecystitis. CTA had also been ordered, which was negative for mesenteric ischemia. He ultimately had a cholecystectomy 01/26/16 by Dr. Lovell SheehanJenkins. He has been referred to Hematology due to leukopenia since November of last year and steady unintentional weight loss.   He is up 2 lbs from his last visit. He has lost overall a total of 15 lbs from March. States he has some pain in the middle of his belly. Sometimes off and on. BMs are productive. No N/V. Pain sometimes after eating. Prilosec once daily.   Past Medical History:  Diagnosis Date  . Acute cholecystitis 01/25/2016  . Chronic vomiting   . Hypertension   . Mental retardation   . OSA (obstructive sleep apnea)    no cpap machine  . Schatzki's ring   . Schizophrenia [facility](HCC)     Past Surgical History:  Procedure Laterality Date  . CHOLECYSTECTOMY N/A 01/26/2016   Procedure: LAPAROSCOPIC CHOLECYSTECTOMY;  Surgeon: Franky MachoMark Jenkins, MD;  Location: [facility];  Service: General;  Laterality: N/A;  . COLONOSCOPY N/A 09/21/2014   Dr. Jena Gaussourk: right-sided diverticulosis, internal hemorrhoids. Repeat in 10 years.   . ESOPHAGOGASTRODUODENOSCOPY N/A 09/21/2014   Dr. Jena Gaussourk: Subtle non-critical Schatzki's ring s/p 6956 F dilation, query occult cervical esophageal web. Hiatal hernia. Question gastroparesis.   [name]. MALONEY DILATION N/A 09/21/2014   Procedure: Elease HashimotoMALONEY DILATION;  Surgeon: Corbin Adeobert M Rourk, MD;  Location: [facility];  Service:  Endoscopy;  Laterality: N/A;  . None    . SAVORY DILATION N/A 09/21/2014   Procedure: SAVORY DILATION;  Surgeon: Corbin Adeobert M Rourk, MD;  Location: [facility];  Service: Endoscopy;  Laterality: N/A;    Current Outpatient Prescriptions  Medication Sig Dispense Refill  . amLODipine (NORVASC) 5 MG tablet Take 5 mg by mouth every morning.    [name]. buPROPion (WELLBUTRIN SR) 150 MG 12 hr tablet Take 150 mg by mouth 2 (two) times daily.    . cyclobenzaprine (FLEXERIL) 10 MG tablet Take 10 mg by mouth at bedtime.    . docusate sodium (COLACE) 100 MG capsule Take 100 mg by mouth 2 (two) times daily.    . fluticasone (FLONASE) 50 MCG/ACT nasal spray Place 2 sprays into both nostrils daily.    [name]. gemfibrozil (LOPID) 600 MG tablet Take 600 mg by mouth 2 (two) times daily.    . mirtazapine (REMERON) 15 MG tablet Take 15 mg by mouth at bedtime.    [name]. omeprazole (PRILOSEC) 20 MG capsule Take 20 mg by mouth daily before breakfast.    . oxybutynin (DITROPAN) 5 MG tablet Take 5 mg by mouth every morning. Reported on 02/21/2016    . potassium chloride (MICRO-K) 10 MEQ CR capsule Take 10 mEq by mouth 2 (two) times daily.    . ranitidine (ZANTAC) 300 MG tablet Take 300 mg by mouth at bedtime.    . tamsulosin (FLOMAX) 0.4 MG CAPS capsule Take 0.4 mg by mouth every evening.     .[name]  thioridazine (MELLARIL) 50 MG tablet Take 50 mg by mouth 2 (two) times daily.    [name] triamterene-hydrochlorothiazide (MAXZIDE-25) 37.5-25 MG per tablet Take 1 tablet by mouth every morning.    . Vitamin D, Ergocalciferol, (DRISDOL) 50000 UNITS CAPS capsule Take 50,000 Units by mouth every 14 (fourteen) days.     No current facility-administered medications for this visit.     Allergies as of 10/11/2016  . (No Known Allergies)    Family History  Problem Relation Age of Onset  . Colon cancer      unknown    Social History   Social History  . Marital status: Single    Spouse name: N/A  . Number of children: N/A  . Years of education:  N/A   Social History Main Topics  . Smoking status: Current Every Day Smoker    Packs/day: 0.50    Types: Cigarettes  . Smokeless tobacco: None     Comment: 4 cigarettes a day  . Alcohol use No  . Drug use: No  . Sexual activity: Not Asked   Other Topics Concern  . None   Social History Narrative  . None    Review of Systems: As mentioned in HPI   Physical Exam: BP 127/83   Pulse 84   Temp 98.4 F (36.9 C) (Oral)   Ht 5\' 8"  (1.727 m)   Wt 150 lb 9.6 oz (68.3 kg)   BMI 22.90 kg/m  General:   Alert and oriented. No distress noted. Pleasant and cooperative.  Head:  Normocephalic and atraumatic. Eyes:  Conjuctiva clear without scleral icterus. Mouth:  Oral mucosa pink and moist.  Abdomen:  +BS, soft, non-tender and non-distended. No rebound or guarding. No HSM Msk:  Symmetrical without gross deformities. Normal posture. Extremities:  Without edema. Neurologic:  Alert and  oriented x4 Psych:  Alert and cooperative. Normal mood and affect.  Lab Results  Component Value Date   WBC 3.2 (L) 09/25/2016   HGB 14.1 09/25/2016   HCT 41.3 09/25/2016   MCV 88.1 09/25/2016   PLT 140 09/25/2016

## 2016-10-13 NOTE — Assessment & Plan Note (Signed)
Persistent, unintentional weight loss. Leukopenia noted since Nov 2016. Thorough work-up to include GES normal, CTA negative earlier this year. Hematology appointment upcoming. Patient is a poor historian due to cognitive deficits. Has been thoroughly evaluated from a GI standpoint.

## 2016-10-13 NOTE — Assessment & Plan Note (Signed)
Thorough work-up completed thus far. Query symptoms secondary to GERD/gastritis at this point. Stop Prilosec. Trial of Dexilant. Gallbladder absent. Return in 3 months.

## 2016-10-14 NOTE — Progress Notes (Signed)
CC'D TO PCP °

## 2016-10-30 ENCOUNTER — Encounter (HOSPITAL_COMMUNITY): Payer: Medicare Other | Attending: Hematology | Admitting: Hematology

## 2016-10-30 ENCOUNTER — Encounter (HOSPITAL_COMMUNITY): Payer: Self-pay | Admitting: Hematology

## 2016-10-30 ENCOUNTER — Encounter (HOSPITAL_COMMUNITY): Payer: Medicare Other

## 2016-10-30 VITALS — BP 136/77 | HR 85 | Temp 97.7°F | Resp 18 | Ht 68.0 in | Wt 155.2 lb

## 2016-10-30 DIAGNOSIS — D72819 Decreased white blood cell count, unspecified: Secondary | ICD-10-CM

## 2016-10-30 DIAGNOSIS — Z800 Family history of malignant neoplasm of digestive organs: Secondary | ICD-10-CM

## 2016-10-30 DIAGNOSIS — D709 Neutropenia, unspecified: Secondary | ICD-10-CM

## 2016-10-30 DIAGNOSIS — F29 Unspecified psychosis not due to a substance or known physiological condition: Secondary | ICD-10-CM

## 2016-10-30 DIAGNOSIS — R634 Abnormal weight loss: Secondary | ICD-10-CM

## 2016-10-30 DIAGNOSIS — F209 Schizophrenia, unspecified: Secondary | ICD-10-CM

## 2016-10-30 DIAGNOSIS — Z720 Tobacco use: Secondary | ICD-10-CM

## 2016-10-30 LAB — COMPREHENSIVE METABOLIC PANEL
ALT: 26 U/L (ref 17–63)
AST: 31 U/L (ref 15–41)
Albumin: 4.4 g/dL (ref 3.5–5.0)
Alkaline Phosphatase: 90 U/L (ref 38–126)
Anion gap: 6 (ref 5–15)
BILIRUBIN TOTAL: 0.8 mg/dL (ref 0.3–1.2)
BUN: 14 mg/dL (ref 6–20)
CO2: 33 mmol/L — ABNORMAL HIGH (ref 22–32)
CREATININE: 1.11 mg/dL (ref 0.61–1.24)
Calcium: 9.6 mg/dL (ref 8.9–10.3)
Chloride: 98 mmol/L — ABNORMAL LOW (ref 101–111)
Glucose, Bld: 64 mg/dL — ABNORMAL LOW (ref 65–99)
POTASSIUM: 3.8 mmol/L (ref 3.5–5.1)
Sodium: 137 mmol/L (ref 135–145)
TOTAL PROTEIN: 7.7 g/dL (ref 6.5–8.1)

## 2016-10-30 LAB — RETICULOCYTES
RBC.: 5.07 MIL/uL (ref 4.22–5.81)
RETIC CT PCT: 1.9 % (ref 0.4–3.1)
Retic Count, Absolute: 96.3 10*3/uL (ref 19.0–186.0)

## 2016-10-30 LAB — VITAMIN B12: Vitamin B-12: 368 pg/mL (ref 180–914)

## 2016-10-30 LAB — LACTATE DEHYDROGENASE: LDH: 201 U/L — ABNORMAL HIGH (ref 98–192)

## 2016-10-30 NOTE — Progress Notes (Signed)
Kyle Wade Kitchen    HEMATOLOGY/ONCOLOGY CONSULTATION NOTE  Date of Service: 10/30/2016  Patient Care Team: Oley Balm. Margo Common, MD as PCP - General (Family Medicine) [name], MD as Consulting Physician (Gastroenterology) Dr Lynnell Grain (Psychiatry)  CHIEF COMPLAINTS/PURPOSE OF CONSULTATION:  Leucopenia/Neutropenia  HISTORY OF PRESENTING ILLNESS:   Kyle Wade is a wonderful 60 y.o. male who has been referred to [place] by Dr .[name], MD/Dr Rourk for evaluation and management of leucopenia/neutropenia.  Patient has a history of hypertension, mental retardation, sleep apnea not using CPAP, history of Schatzki's ring status post esophageal dilatation and schizophrenia (on severe psych medications including Thorazine).  Patient is a very poor historian due to cognitive deficits and is unable to provide any coherent medical history or information regarding his symptoms. Information is provided by his accompanying group home staff.  He had recent labs with Dr [name] on 09/25/2016 and was noted to have leukopenia with a WBC count of 3.2k with an ANC of 900 and normal hemoglobin and platelet count.  He has had mild leukopenia since at least November 2016 when his WBC count was 3.4k and on 02/04/2016 when it was 3.6k with an ANC of 1400. ANC has reduced since then.  No clear information regarding any significant infections needing antibiotics. No new bone pains.   Had issues with chronic nausea and vomiting and weight loss with decreased appetite and was diagnosed with cholecystitis and her laparoscopic cholecystectomy on 01/26/2016. He has apparently been eating better since then. And as noted below appears to have gained about 7 pounds in the last 2 months based on our weight readings. Wt Readings from Last 3 Encounters:  10/30/16 155 lb 3 oz (70.4 kg)  10/11/16 150 lb 9.6 oz (68.3 kg)  08/28/16 148 lb 6.4 oz (67.3 kg)  Patient notes that he is feeling hungry and eating well and has been on  Remeron which might help his appetite as well. Has had an EGD in October 2015 that showed a Schatzki's ring status post [facility] dilatation. Colonoscopy was also done in October 2015 and showed Colonic diverticulosis (right-sided) Internal hemorrhoids. Otherwise normal ileocolonoscopy.  Chest x-ray on 01/25/2016 showed no active cardiopulmonary disease. Patient has no other acute focal symptoms at this time.   MEDICAL HISTORY:  Past Medical History:  Diagnosis Date  . Acute cholecystitis 01/25/2016  . Chronic vomiting   . Hypertension   . Mental retardation   . OSA (obstructive sleep apnea)    no cpap machine  . Schatzki's ring   . Schizophrenia (HCC)     SURGICAL HISTORY: Past Surgical History:  Procedure Laterality Date  . CHOLECYSTECTOMY N/A 01/26/2016   Procedure: LAPAROSCOPIC CHOLECYSTECTOMY;  Surgeon: [name], MD;  Location: [facility];  Service: General;  Laterality: N/A;  . COLONOSCOPY N/A 09/21/2014   [name]: right-sided diverticulosis, internal hemorrhoids. Repeat in 10 years.   . ESOPHAGOGASTRODUODENOSCOPY N/A 09/21/2014   [name]: Subtle non-critical Schatzki's ring s/p 23 F dilation, query occult cervical esophageal web. Hiatal hernia. Question gastroparesis.   Kyle Wade Kitchen MALONEY DILATION N/A 09/21/2014   Procedure: Elease Hashimoto DILATION;  Surgeon: [name], MD;  Location[facility];  Service: Endoscopy;  Laterality: N/A;  . None    . SAVORY DILATION N/A 09/21/2014   Procedure: SAVORY DILATION;  Surgeon: [name], MD;  Location: [facility];  Service: Endoscopy;  Laterality: N/A;    SOCIAL HISTORY: Social History   Social History  . Marital status: Single  Spouse name: N/A  . Number of children: N/A  . Years of education: N/A   Occupational History  . Not on file.   Social History Main Topics  . Smoking status: Current Every Day Smoker    Packs/day: 0.50    Types: Cigarettes  . Smokeless tobacco: Not on file     Comment: 4 cigarettes a day  .  Alcohol use No  . Drug use: No  . Sexual activity: Not on file   Other Topics Concern  . Not on file   Social History Narrative  . No narrative on file    FAMILY HISTORY: Family History  Problem Relation Age of Onset  . Colon cancer      unknown    ALLERGIES:  has No Known Allergies.  MEDICATIONS:  Current Outpatient Prescriptions  Medication Sig Dispense Refill  . amLODipine (NORVASC) 5 MG tablet Take 5 mg by mouth every morning.    Kyle Wade Kitchen. buPROPion (WELLBUTRIN SR) 150 MG 12 hr tablet Take 150 mg by mouth 2 (two) times daily.    . cyclobenzaprine (FLEXERIL) 10 MG tablet Take 10 mg by mouth at bedtime.    Kyle Wade Kitchen. dexlansoprazole (DEXILANT) 60 MG capsule Take 1 capsule (60 mg total) by mouth daily. 90 capsule 3  . docusate sodium (COLACE) 100 MG capsule Take 100 mg by mouth 2 (two) times daily.    . fluticasone (FLONASE) 50 MCG/ACT nasal spray Place 2 sprays into both nostrils daily.    Kyle Wade Kitchen. gemfibrozil (LOPID) 600 MG tablet Take 600 mg by mouth 2 (two) times daily.    . mirtazapine (REMERON) 15 MG tablet Take 15 mg by mouth at bedtime.    Kyle Wade Kitchen. oxybutynin (DITROPAN) 5 MG tablet Take 5 mg by mouth every morning. Reported on 02/21/2016    . potassium chloride (MICRO-K) 10 MEQ CR capsule Take 10 mEq by mouth 2 (two) times daily.    . ranitidine (ZANTAC) 300 MG tablet Take 300 mg by mouth at bedtime.    . tamsulosin (FLOMAX) 0.4 MG CAPS capsule Take 0.4 mg by mouth every evening.     . thioridazine (MELLARIL) 50 MG tablet Take 50 mg by mouth 2 (two) times daily.    Kyle Wade Kitchen. triamterene-hydrochlorothiazide (MAXZIDE-25) 37.5-25 MG per tablet Take 1 tablet by mouth every morning.    . Vitamin D, Ergocalciferol, (DRISDOL) 50000 UNITS CAPS capsule Take 50,000 Units by mouth every 14 (fourteen) days.     No current facility-administered medications for this visit.     REVIEW OF SYSTEMS:    10 Point review of Systems was done is negative except as noted above.  PHYSICAL EXAMINATION: ECOG PERFORMANCE STATUS:  2 - Symptomatic, <50% confined to bed  . Vitals:   10/30/16 1053  BP: 136/77  Pulse: 85  Resp: 18  Temp: 97.7 F (36.5 C)   Filed Weights   10/30/16 1053  Weight: 155 lb 3 oz (70.4 kg)   .Body mass index is 23.6 kg/m.  GENERAL:alert, in no acute distress and comfortable SKIN: skin color, texture, turgor are normal, no rashes or significant lesions EYES: normal, conjunctiva are pink and non-injected, sclera clear OROPHARYNX:no exudate, no erythema and lips, buccal mucosa, and tongue normal  NECK: supple, no JVD, thyroid normal size, non-tender, without nodularity LYMPH:  no palpable lymphadenopathy in the cervical, axillary or inguinal LUNGS: clear to auscultation with normal respiratory effort HEART: regular rate & rhythm,  no murmurs and no lower extremity edema ABDOMEN: abdomen soft, non-tender, normoactive bowel sounds ,  No hepatosplenomegaly  Musculoskeletal: no cyanosis of digits and no clubbing  PSYCH: alert & oriented x 3 with fluent speech NEURO: no focal motor/sensory deficits  LABORATORY DATA:  I have reviewed the data as listed  . CBC Latest Ref Rng & Units 09/25/2016 01/27/2016 01/26/2016  WBC 3.8 - 10.8 K/uL 3.2(L) 5.8 3.9(L)  Hemoglobin 13.2 - 17.1 g/dL 19.1 12.9(L) 14.9  Hematocrit 38.5 - 50.0 % 41.3 36.4(L) 43.3  Platelets 140 - 400 K/uL 140 162 136(L)   . CBC    Component Value Date/Time   WBC 3.2 (L) 09/25/2016 1240   RBC 4.69 09/25/2016 1240   HGB 14.1 09/25/2016 1240   HCT 41.3 09/25/2016 1240   PLT 140 09/25/2016 1240   MCV 88.1 09/25/2016 1240   MCH 30.1 09/25/2016 1240   MCHC 34.1 09/25/2016 1240   RDW 12.9 09/25/2016 1240   LYMPHSABS 1,792 09/25/2016 1240   MONOABS 416 09/25/2016 1240   EOSABS 64 09/25/2016 1240   BASOSABS 0 09/25/2016 1240    . CMP Latest Ref Rng & Units 01/27/2016 01/26/2016 01/26/2016  Glucose 65 - 99 mg/dL 660(A) - 74  BUN 6 - 20 mg/dL 18 - 18  Creatinine 0.04 - 1.24 mg/dL 5.99(H) - 7.41  Sodium 135 - 145 mmol/L  135 - 141  Potassium 3.5 - 5.1 mmol/L 3.9 - 3.5  Chloride 101 - 111 mmol/L 99(L) - 100(L)  CO2 22 - 32 mmol/L 28 - 33(H)  Calcium 8.9 - 10.3 mg/dL 4.2(L) - 9.3  Total Protein 6.5 - 8.1 g/dL - 7.1 6.6  Total Bilirubin 0.3 - 1.2 mg/dL - 0.6 0.6  Alkaline Phos 38 - 126 U/L - 71 67  AST 15 - 41 U/L - 54(H) 23  ALT 17 - 63 U/L - 38 20     RADIOGRAPHIC STUDIES: I have personally reviewed the radiological images as listed and agreed with the findings in the report. No results found.  ASSESSMENT & PLAN:   60 year old African-American male with mental retardation and schizophrenia with   #1 leukopenia with worsening neutropenia. Patient noted to have leukopenia since at least November 2016 and his ANC has dropped from about 1400 in 01/2016 down to 900 on 09/25/2016. No issues with frequent infections. No symptomatology suggesting recent viral infection.  He has been on chronic PPI therapy which could cause vitamin B12 deficiency . No lymphadenopathy or hepatosplenomegaly to suggest a lymphoproliferative process.  I strongly suspect this is related to Thioridazine and will need to be monitored closely since this has the potential to cause significant agranulocytosis which can sometimes be fatal. Plan -We shall get repeat CBC with differential, reticulocyte count, LDH, B12 level  -I strongly suspect that the patient's neutropenia is likely related to one of his psychiatric medications especially the Thioridazine leukopenia/neutropenia/agranulocytosis. -Would need to be evaluated immediately by his psychiatrist Dr Ave Filter to determine the pros and cons of continued thioridazine and would need to consider discontinuation /replacement if the ANC remains below 1000. -Continue follow-up with his primary care physician for his multiple other symptoms including dizziness urinary retention which would be a function of medication adverse effects.  #2 history of weight loss . Wt Readings from Last 3  Encounters:  10/30/16 155 lb 3 oz (70.4 kg)  10/11/16 150 lb 9.6 oz (68.3 kg)  08/28/16 148 lb 6.4 oz (67.3 kg)  - Based on our starting the patient has been gaining weight and has gained 7 pounds in the last 2 months.   Return  to care with Dr. Galen Manila and 6-8 weeks with repeat CBC, CMP.   All of the patients group staff's questions were answered in details to their apparent satisfaction. The patient knows to call the clinic with any problems, questions or concerns.  I spent 50 minutes counseling the patient face to face. The total time spent in the appointment was 60 minutes and more than 50% was on counseling and direct patient cares.    Wyvonnia Lora MD MS AAHIVMS [facility] Hematology/Oncology Physician [facility]  (Office):       [phone] (Work cell):  [phone] (Fax):           [phone]  10/30/2016 10:47 AM

## 2016-10-30 NOTE — Patient Instructions (Addendum)
[facility] at [facility]  Discharge Instructions:  Seen by MD Candise Che. Labs today Follow up with MD Penland in 6-8 weeks. Recommendation: Take B-complex daily (over the counter) _______________________________________________________________  Thank you for choosing [facility] at [facility] to provide your oncology and hematology care.  To afford each patient quality time with our providers, please arrive at least 15 minutes before your scheduled appointment.  You need to re-schedule your appointment if you arrive 10 or more minutes late.  We strive to give you quality time with our providers, and arriving late affects you and other patients whose appointments are after yours.  Also, if you no show three or more times for appointments you may be dismissed from the clinic.  Again, thank you for choosing [facility] at [facility]. Our hope is that these requests will allow you access to exceptional care and in a timely manner. _______________________________________________________________  If you have questions after your visit, please contact our office at [phone] between the hours of 8:30 a.m. and 5:00 p.m. Voicemails left after 4:30 p.m. will not be returned until the following business day. _______________________________________________________________  For prescription refill requests, have your pharmacy contact our office. _______________________________________________________________  Recommendations made by the consultant and any test results will be sent to your referring physician. _______________________________________________________________

## 2016-10-31 LAB — FOLATE RBC
FOLATE, HEMOLYSATE: 372 ng/mL
FOLATE, RBC: 840 ng/mL (ref >498)
HEMATOCRIT: 44.3 % (ref 37.5–51.0)

## 2016-11-13 DIAGNOSIS — R7301 Impaired fasting glucose: Secondary | ICD-10-CM | POA: Diagnosis not present

## 2016-11-13 DIAGNOSIS — Z79899 Other long term (current) drug therapy: Secondary | ICD-10-CM | POA: Diagnosis not present

## 2016-11-13 DIAGNOSIS — K219 Gastro-esophageal reflux disease without esophagitis: Secondary | ICD-10-CM | POA: Diagnosis not present

## 2016-11-13 DIAGNOSIS — I059 Rheumatic mitral valve disease, unspecified: Secondary | ICD-10-CM | POA: Diagnosis not present

## 2016-11-13 DIAGNOSIS — I10 Essential (primary) hypertension: Secondary | ICD-10-CM | POA: Diagnosis not present

## 2016-11-13 DIAGNOSIS — Z6824 Body mass index (BMI) 24.0-24.9, adult: Secondary | ICD-10-CM | POA: Diagnosis not present

## 2016-11-13 DIAGNOSIS — G4733 Obstructive sleep apnea (adult) (pediatric): Secondary | ICD-10-CM | POA: Diagnosis not present

## 2016-11-13 DIAGNOSIS — R413 Other amnesia: Secondary | ICD-10-CM | POA: Diagnosis not present

## 2016-11-13 DIAGNOSIS — E781 Pure hyperglyceridemia: Secondary | ICD-10-CM | POA: Diagnosis not present

## 2016-11-13 DIAGNOSIS — Z23 Encounter for immunization: Secondary | ICD-10-CM | POA: Diagnosis not present

## 2016-11-13 DIAGNOSIS — Z0000 Encounter for general adult medical examination without abnormal findings: Secondary | ICD-10-CM | POA: Diagnosis not present

## 2016-12-04 DIAGNOSIS — I739 Peripheral vascular disease, unspecified: Secondary | ICD-10-CM | POA: Diagnosis not present

## 2016-12-04 DIAGNOSIS — L110 Acquired keratosis follicularis: Secondary | ICD-10-CM | POA: Diagnosis not present

## 2016-12-04 DIAGNOSIS — B351 Tinea unguium: Secondary | ICD-10-CM | POA: Diagnosis not present

## 2016-12-05 DIAGNOSIS — F209 Schizophrenia, unspecified: Secondary | ICD-10-CM | POA: Diagnosis not present

## 2016-12-23 ENCOUNTER — Ambulatory Visit (HOSPITAL_COMMUNITY): Payer: Medicare Other | Admitting: Hematology & Oncology

## 2016-12-24 NOTE — Progress Notes (Signed)
This encounter was created in error - please disregard.

## 2017-01-02 ENCOUNTER — Ambulatory Visit (HOSPITAL_COMMUNITY): Payer: Medicare Other | Admitting: Hematology & Oncology

## 2017-01-14 ENCOUNTER — Ambulatory Visit: Payer: Medicare Other | Admitting: Gastroenterology

## 2017-01-14 ENCOUNTER — Telehealth: Payer: Self-pay | Admitting: Gastroenterology

## 2017-01-14 ENCOUNTER — Encounter: Payer: Self-pay | Admitting: Gastroenterology

## 2017-01-14 NOTE — Telephone Encounter (Signed)
PT WAS A NO SHOW AND LETTER SENT  °

## 2017-01-15 NOTE — Progress Notes (Signed)
This encounter was created in error - please disregard.

## 2017-02-19 ENCOUNTER — Ambulatory Visit (INDEPENDENT_AMBULATORY_CARE_PROVIDER_SITE_OTHER): Payer: Medicare Other | Admitting: Gastroenterology

## 2017-02-19 ENCOUNTER — Encounter: Payer: Self-pay | Admitting: Gastroenterology

## 2017-02-19 ENCOUNTER — Telehealth: Payer: Self-pay | Admitting: Gastroenterology

## 2017-02-19 VITALS — BP 139/81 | HR 94 | Temp 98.1°F | Ht 66.0 in | Wt 146.0 lb

## 2017-02-19 DIAGNOSIS — R634 Abnormal weight loss: Secondary | ICD-10-CM

## 2017-02-19 NOTE — Patient Instructions (Signed)
Good to see you today, Kyle Wade!  Please keep appointment with Hematology that is coming up next week.   I will see you in 6 weeks for a weight check.

## 2017-02-19 NOTE — Progress Notes (Signed)
Referring Provider: [name]., MD Primary Care Physician:  [name], MD Primary GI: [name]   Chief Complaint  Patient presents with  . Weight Loss    HPI:   Kyle Wade is a 61 y.o. male presenting today with a history of weight loss, dyspepsia, and an extensive evaluation previously. Due to his cognitive status (mentally challenged), it has been historically difficult to obtain a complete history. Ultimately, a HIDA scan was performed 01/25/16 that showed acute cholecystitis. CTA had also been ordered, which was negative for mesenteric ischemia. He ultimately had a cholecystectomy 01/26/16 by Dr. Lovell Sheehan. Due to persistent leukopenia, I referred him to Hematology. It was felt this could be related to Thioridazine and recommended close monitoring due to potential for significant agranulocytosis. He was to return to see Hematology but did not. He will be going March 12th.  He is 146 today, down from 155 in Nov.   Likes chicken sandwiches, french fries, hamburgers, hot dogs. Abdomen feels somewhat "hot" sometimes. Using ensure/boost 2 a day then cut back to one.    Past Medical History:  Diagnosis Date  . Acute cholecystitis 01/25/2016  . Chronic vomiting   . Hypertension   . Mental retardation   . OSA (obstructive sleep apnea)    no cpap machine  . Schatzki's ring   . Schizophrenia [facility])     Past Surgical History:  Procedure Laterality Date  . CHOLECYSTECTOMY N/A 01/26/2016   Procedure: LAPAROSCOPIC CHOLECYSTECTOMY;  Surgeon: [name], MD;  Location: [facility];  Service: General;  Laterality: N/A;  . COLONOSCOPY N/A 09/21/2014   [name]: right-sided diverticulosis, internal hemorrhoids. Repeat in 10 years.   . ESOPHAGOGASTRODUODENOSCOPY N/A 09/21/2014   [name]: Subtle non-critical Schatzki's ring s/p 55 F dilation, query occult cervical esophageal web. Hiatal hernia. Question gastroparesis.   [name] MALONEY DILATION N/A 09/21/2014   Procedure: Elease Hashimoto DILATION;   Surgeon: [name], MD;  Location: [facility];  Service: Endoscopy;  Laterality: N/A;  . None    . SAVORY DILATION N/A 09/21/2014   Procedure: SAVORY DILATION;  Surgeon: [name], MD;  Location: [facility];  Service: Endoscopy;  Laterality: N/A;    Current Outpatient Prescriptions  Medication Sig Dispense Refill  . amLODipine (NORVASC) 5 MG tablet Take 5 mg by mouth every morning.    [name] buPROPion (WELLBUTRIN SR) 150 MG 12 hr tablet Take 150 mg by mouth 2 (two) times daily.    . cyclobenzaprine (FLEXERIL) 10 MG tablet Take 10 mg by mouth at bedtime.    [name] dexlansoprazole (DEXILANT) 60 MG capsule Take 1 capsule (60 mg total) by mouth daily. 90 capsule 3  . docusate sodium (COLACE) 100 MG capsule Take 100 mg by mouth 2 (two) times daily.    . fluticasone (FLONASE) 50 MCG/ACT nasal spray Place 2 sprays into both nostrils daily.    [name] gemfibrozil (LOPID) 600 MG tablet Take 600 mg by mouth 2 (two) times daily.    . mirtazapine (REMERON) 15 MG tablet Take 15 mg by mouth at bedtime.    [name] oxybutynin (DITROPAN) 5 MG tablet Take 5 mg by mouth every morning. Reported on 02/21/2016    . potassium chloride (MICRO-K) 10 MEQ CR capsule Take 10 mEq by mouth 2 (two) times daily.    . ranitidine (ZANTAC) 300 MG tablet Take 300 mg by mouth at bedtime.    . risperiDONE (RISPERDAL) 1 MG tablet Take 1 mg by mouth at bedtime.    [name]  tamsulosin (FLOMAX) 0.4 MG CAPS capsule Take 0.4 mg by mouth every evening.     . triamterene-hydrochlorothiazide (MAXZIDE-25) 37.5-25 MG per tablet Take 1 tablet by mouth every morning.    . Vitamin D, Ergocalciferol, (DRISDOL) 50000 UNITS CAPS capsule Take 50,000 Units by mouth every 14 (fourteen) days.    [name] thioridazine (MELLARIL) 50 MG tablet Take 50 mg by mouth 2 (two) times daily.     No current facility-administered medications for this visit.     Allergies as of 02/19/2017  . (No Known Allergies)    Family History  Problem Relation Age of Onset  . Colon cancer       unknown    Social History   Social History  . Marital status: Single    Spouse name: N/A  . Number of children: N/A  . Years of education: N/A   Social History Main Topics  . Smoking status: Current Every Day Smoker    Packs/day: 0.50    Types: Cigarettes  . Smokeless tobacco: Never Used     Comment: 4 cigarettes a day  . Alcohol use No  . Drug use: No  . Sexual activity: Not on file   Other Topics Concern  . Not on file   Social History Narrative  . No narrative on file    Review of Systems: As mentioned in HPI.   Physical Exam: BP 139/81   Pulse 94   Temp 98.1 F (36.7 C) (Oral)   Ht 5\' 6"  (1.676 m)   Wt 146 lb (66.2 kg)   BMI 23.57 kg/m  General:   Alert and oriented. No distress noted. Pleasant and cooperative.  Head:  Normocephalic and atraumatic. Eyes:  Conjuctiva clear without scleral icterus. Mouth:  Oral mucosa pink and moist.  Abdomen:  +BS, soft, non-tender and non-distended. No rebound or guarding. No HSM or masses noted. Msk:  Symmetrical without gross deformities. Normal posture. Extremities:  Without edema. Neurologic:  Alert and  oriented x4;  grossly normal neurologically. Psych:  Alert and cooperative. Normal mood and affect.

## 2017-02-19 NOTE — Progress Notes (Signed)
CC'ED TO PCP 

## 2017-02-19 NOTE — Telephone Encounter (Signed)
Can we mail an ifobt? I would like to check this before next visit and/or he could bring with him completed to his visit in April.

## 2017-02-19 NOTE — Telephone Encounter (Signed)
ifobt in the mail to the pt.  

## 2017-02-19 NOTE — Assessment & Plan Note (Signed)
Steady weight loss that appeared to improve the end of last year, yet now with trend downward again. No rectal bleeding, abdominal pain, N/V. Thorough work-up from GI standpoint to include Colonoscopy/EGD (2015), negative CTA for mesenteric ischemia, referral to Hematology for leukopenia/neutropenia which was likely related to med effect (Thioridazine) and now discontinued. Unknown family history. At this point, I would like to see him back in 6 weeks. Continue boost supplementation. Will go ahead and request an ifobt, as his last colonoscopy was in Oct 2015 and due to history of mentally challenged, it is somewhat difficult to obtain a complete history. Would have low threshold for updated colonoscopy if he has lost more weight and would certainly pursue colonoscopy if heme positive.

## 2017-02-24 ENCOUNTER — Ambulatory Visit (HOSPITAL_COMMUNITY): Payer: Medicare Other

## 2017-03-13 DIAGNOSIS — F209 Schizophrenia, unspecified: Secondary | ICD-10-CM | POA: Diagnosis not present

## 2017-03-31 ENCOUNTER — Other Ambulatory Visit: Payer: Self-pay | Admitting: Gastroenterology

## 2017-04-03 ENCOUNTER — Other Ambulatory Visit: Payer: Self-pay

## 2017-04-03 ENCOUNTER — Telehealth: Payer: Self-pay

## 2017-04-03 ENCOUNTER — Ambulatory Visit (INDEPENDENT_AMBULATORY_CARE_PROVIDER_SITE_OTHER): Payer: Medicare Other | Admitting: Gastroenterology

## 2017-04-03 ENCOUNTER — Encounter: Payer: Self-pay | Admitting: Gastroenterology

## 2017-04-03 VITALS — BP 142/81 | HR 94 | Temp 98.0°F | Ht 68.0 in | Wt 143.6 lb

## 2017-04-03 DIAGNOSIS — R634 Abnormal weight loss: Secondary | ICD-10-CM | POA: Diagnosis not present

## 2017-04-03 DIAGNOSIS — R1310 Dysphagia, unspecified: Secondary | ICD-10-CM | POA: Diagnosis not present

## 2017-04-03 DIAGNOSIS — D72819 Decreased white blood cell count, unspecified: Secondary | ICD-10-CM

## 2017-04-03 MED ORDER — NA SULFATE-K SULFATE-MG SULF 17.5-3.13-1.6 GM/177ML PO SOLN: 1.0000 | ORAL | 0 refills | Status: DC

## 2017-04-03 NOTE — Progress Notes (Signed)
CC'ED TO PCP 

## 2017-04-03 NOTE — Assessment & Plan Note (Signed)
61 year old pleasant male with recurrent vague dysphagia, difficult to obtain full history due to cognitive impairment. He answers to simple questions but can not give an adequate history. Last EGD in 2015 with subtle non-critical Schatzki's ring s/p dilation, query occult cervical web. He has had slow, persistent weight loss. With this clinical scenario, favor repeating EGD/dilation with BPE if EGD is normal.   Proceed with upper endoscopy.dilation in the near future with [name]. The risks, benefits, and alternatives have been discussed in detail with patient. They have stated understanding and desire to proceed.  PROPOFOL Continue Dexilant

## 2017-04-03 NOTE — Telephone Encounter (Signed)
Tried to call [phone] to inform of pt's pre-op appt. Went straight to VM, unable to leave message d/t VM hasn't been set-up yet. Letter mailed. Pre-op is 05/09/17 at 9:00am.

## 2017-04-03 NOTE — Progress Notes (Signed)
Referring Provider: [name]., MD Primary Care Physician:  [name], MD Primary GI: [name]   Chief Complaint  Patient presents with  . Weight Loss    HPI:   Kyle Wade is a 61 y.o. male presenting today with a history of weight loss, dyspepsia, and an extensive evaluation previously. Due to his cognitive status (mentally challenged), it has been historicallydifficult to obtain a complete history. Ultimately, a HIDA scan was performed 01/25/16 that showed acute cholecystitis. CTA had also been ordered, which was negative for mesenteric ischemia. He ultimately had a cholecystectomy 01/26/16 by Dr. Lovell Sheehan. Due to persistent leukopenia, I referred him to Hematology. It was felt this could be related to Thioridazine and recommended close monitoring due to potential for significant agranulocytosis. He has not been seen in follow-up.  He is 143 today, down from 146 a month ago.  He was 155 in Nov. He was unable to make it to the March 12th appt with hematology due to weather per caseworker.    Likes chicken sandwiches, french fries, hamburgers, hot dogs. Abdomen feels somewhat "hot" sometimes. Using ensure/boost 2 a day. Good appetite.   I ordered an ifobt at last visit, but this was not done. I am unsure if this is even feasible with his cognitive status.  He continues to lose weight. He had started regaining late last year, but now he is slowly declining again. He has lost a total of 56 lbs since 2015, with 20 lbs of that in the past year. He is unsure if he has seen blood in his stool. Sometimes swallows wrong and it goes the wrong way. Sometimes food "doesn't want to go down like the other ones". It is notable that his last EGD in 2015 showed a subtle Schatzki's ring s/p dilation, and colonoscopy was unrevealing in 2015. His family history is unknown. He is a chronic smoker.   He had a right upper lobe lung nodule in Jan 2015, and the most recent chest xray in Feb 2017 was  stable. I do not see where he has had a CT of his chest.   Past Medical History:  Diagnosis Date  . Acute cholecystitis 01/25/2016  . Chronic vomiting   . Hypertension   . Mental retardation   . OSA (obstructive sleep apnea)    no cpap machine  . Schatzki's ring   . Schizophrenia [facility])     Past Surgical History:  Procedure Laterality Date  . CHOLECYSTECTOMY N/A 01/26/2016   Procedure: LAPAROSCOPIC CHOLECYSTECTOMY;  Surgeon: [name], MD;  Location: [facility];  Service: General;  Laterality: N/A;  . COLONOSCOPY N/A 09/21/2014   [name]: right-sided diverticulosis, internal hemorrhoids. Repeat in 10 years.   . ESOPHAGOGASTRODUODENOSCOPY N/A 09/21/2014   [name]: Subtle non-critical Schatzki's ring s/p 57 F dilation, query occult cervical esophageal web. Hiatal hernia. Question gastroparesis.   [name] MALONEY DILATION N/A 09/21/2014   Procedure: Elease Hashimoto DILATION;  Surgeon: [name], MD;  Location[facility];  Service: Endoscopy;  Laterality: N/A;  . None    . SAVORY DILATION N/A 09/21/2014   Procedure: SAVORY DILATION;  Surgeon: [name], MD;  Location: [facility];  Service: Endoscopy;  Laterality: N/A;    Current Outpatient Prescriptions  Medication Sig Dispense Refill  . amLODipine (NORVASC) 5 MG tablet Take 5 mg by mouth every morning.    [name] buPROPion (WELLBUTRIN SR) 150 MG 12 hr tablet Take 150 mg by mouth 2 (two) times  daily.    . cyclobenzaprine (FLEXERIL) 10 MG tablet Take 10 mg by mouth at bedtime.    [name] DEXILANT 60 MG capsule TAKE 1 CAPSULE BY MOUTH ONCE A DAY. 30 capsule 11  . docusate sodium (COLACE) 100 MG capsule Take 100 mg by mouth 2 (two) times daily.    . fluticasone (FLONASE) 50 MCG/ACT nasal spray Place 2 sprays into both nostrils daily.    [name] gemfibrozil (LOPID) 600 MG tablet Take 600 mg by mouth 2 (two) times daily.    . mirtazapine (REMERON) 15 MG tablet Take 15 mg by mouth at bedtime.    [name] oxybutynin (DITROPAN) 5 MG tablet Take 5 mg by mouth every  morning. Reported on 02/21/2016    . potassium chloride (MICRO-K) 10 MEQ CR capsule Take 10 mEq by mouth 2 (two) times daily.    . ranitidine (ZANTAC) 300 MG tablet Take 300 mg by mouth at bedtime.    . risperiDONE (RISPERDAL) 1 MG tablet Take 1 mg by mouth at bedtime.    . tamsulosin (FLOMAX) 0.4 MG CAPS capsule Take 0.4 mg by mouth every evening.     . triamterene-hydrochlorothiazide (MAXZIDE-25) 37.5-25 MG per tablet Take 1 tablet by mouth every morning.    . Vitamin D, Ergocalciferol, (DRISDOL) 50000 UNITS CAPS capsule Take 50,000 Units by mouth every 14 (fourteen) days.     No current facility-administered medications for this visit.     Allergies as of 04/03/2017  . (No Known Allergies)    Family History  Problem Relation Age of Onset  . Colon cancer      unknown    Social History   Social History  . Marital status: Single    Spouse name: N/A  . Number of children: N/A  . Years of education: N/A   Social History Main Topics  . Smoking status: Current Every Day Smoker    Packs/day: 0.50    Types: Cigarettes  . Smokeless tobacco: Never Used     Comment: 4 cigarettes a day  . Alcohol use No  . Drug use: No  . Sexual activity: Not Asked   Other Topics Concern  . None   Social History Narrative  . None    Review of Systems: Unable to obtain due to limited cognition   Physical Exam: BP (!) 142/81   Pulse 94   Temp 98 F (36.7 C) (Oral)   Ht 5\' 8"  (1.727 m)   Wt 143 lb 9.6 oz (65.1 kg)   BMI 21.83 kg/m  General:   Alert and oriented. No distress noted. Pleasant and cooperative.  Head:  Normocephalic and atraumatic. Eyes:  Conjuctiva clear without scleral icterus. Mouth:  Oral mucosa pink and moist. Poor dentition.  Heart:  S1, S2 present without murmurs, Abdomen:  +BS, soft, non-tender and non-distended. No rebound or guarding. No HSM or masses noted. Msk:  Symmetrical without gross deformities. Normal posture. Extremities:  Without edema. Neurologic:   Alert and  oriented to person, place, situation.  Psych:  Alert and cooperative. Normal mood and affect.   Vitals - 1 value per visit 04/03/2017 02/19/2017 10/30/2016 10/11/2016  Weight (lb) 143.6 146 155.19 150.6   Vitals - 1 value per visit 08/28/2016 02/21/2016 01/27/2016 01/25/2016 01/24/2016  Weight (lb) 148.4 165.6  165 166.8   Vitals - 1 value per visit 01/03/2016 10/17/2015 08/17/2015 06/16/2015 09/21/2014  Weight (lb) 165.6 170.8 175 178 198   Vitals - 1 value per visit 08/29/2014 05/02/2014 03/31/2014 12/24/2013  Weight (  lb) 197.6 199      Lab Results  Component Value Date   WBC 3.2 (L) 09/25/2016   HGB 14.1 09/25/2016   HCT 44.3 10/30/2016   MCV 88.1 09/25/2016   PLT 140 09/25/2016   Lab Results  Component Value Date   ALT 26 10/30/2016   AST 31 10/30/2016   ALKPHOS 90 10/30/2016   BILITOT 0.8 10/30/2016   Lab Results  Component Value Date   CREATININE 1.11 10/30/2016   BUN 14 10/30/2016   NA 137 10/30/2016   K 3.8 10/30/2016   CL 98 (L) 10/30/2016   CO2 33 (H) 10/30/2016   Outside labs with TSH 0.74 in July 2017.

## 2017-04-03 NOTE — Assessment & Plan Note (Addendum)
Steady weight loss, which appeared to have improved the end of last year, yet now trending down again. Upon review of chart, he has lost 56 lbs since 2015, with 20 of this in the preceding year. CTA in early 2017 negative for chronic mesenteric ischemia. Last colonoscopy in 2015 without polyps, but his family history is unknown, and he is a poor historian. Unsure if any rectal bleeding, and I have been unable to obtain an ifobt despite request. To be thorough, update colonoscopy now. I would favor repeat abdominal imaging AND CT chest if both TCS/EGD are normal, as his weight loss is concerning (TSH remains normal). I note he had a stable lung nodule on prior xray, but this should be further characterized in light of continued weight loss.   Proceed with TCS with [name] in near future: the risks, benefits, and alternatives have been discussed with the patient in detail. The patient states understanding and desires to proceed. PROPOFOL Anticipate CT abd/pelvis and CT chest if unrevealing Follow-up with hematology secondary to leukopenia (likely med related) See me in 3 months  Continue Boost/ensure supplementation

## 2017-04-03 NOTE — Patient Instructions (Signed)
We have scheduled you for a colonoscopy, upper endoscopy, and possible dilation with [name].   You will likely need imaging of your abdomen and chest if this is negative.  I would still like to see you in 3 months.

## 2017-04-10 DIAGNOSIS — L110 Acquired keratosis follicularis: Secondary | ICD-10-CM | POA: Diagnosis not present

## 2017-04-10 DIAGNOSIS — I739 Peripheral vascular disease, unspecified: Secondary | ICD-10-CM | POA: Diagnosis not present

## 2017-04-10 DIAGNOSIS — B351 Tinea unguium: Secondary | ICD-10-CM | POA: Diagnosis not present

## 2017-04-21 ENCOUNTER — Encounter: Payer: Self-pay | Admitting: Gastroenterology

## 2017-05-07 NOTE — Patient Instructions (Signed)
Kyle Wade  05/07/2017     @PREFPERIOPPHARMACY @   Your procedure is scheduled on  05/14/2017   Report to [facility] at  1215  P.M.  Call this number if you have problems the morning of surgery:  [phone]   Remember:  Do not eat food or drink liquids after midnight.  Take these medicines the morning of surgery with A SIP OF WATER  Norvasc, wellbutrin, dexilant, ditropan, zantac, maxzide.   Do not wear jewelry, make-up or nail polish.  Do not wear lotions, powders, or perfumes, or deoderant.  Do not shave 48 hours prior to surgery.  Men may shave face and neck.  Do not bring valuables to the hospital.  [facility] is not responsible for any belongings or valuables.  Contacts, dentures or bridgework may not be worn into surgery.  Leave your suitcase in the car.  After surgery it may be brought to your room.  For patients admitted to the hospital, discharge time will be determined by your treatment team.  Patients discharged the day of surgery will not be allowed to drive home.   Name and phone number of your driver:   family Special instructions:  Follow the diet and prep instructions given to you by Dr Luvenia Starch office.  Please read over the following fact sheets that you were given. Anesthesia Post-op Instructions and Care and Recovery After Surgery       Esophagogastroduodenoscopy Esophagogastroduodenoscopy (EGD) is a procedure to examine the lining of the esophagus, stomach, and first part of the small intestine (duodenum). This procedure is done to check for problems such as inflammation, bleeding, ulcers, or growths. During this procedure, a long, flexible, lighted tube with a camera attached (endoscope) is inserted down the throat. Tell a health care provider about:  Any allergies you have.  All medicines you are taking, including vitamins, herbs, eye drops, creams, and over-the-counter medicines.  Any problems you or family members  have had with anesthetic medicines.  Any blood disorders you have.  Any surgeries you have had.  Any medical conditions you have.  Whether you are pregnant or may be pregnant. What are the risks? Generally, this is a safe procedure. However, problems may occur, including:  Infection.  Bleeding.  A tear (perforation) in the esophagus, stomach, or duodenum.  Trouble breathing.  Excessive sweating.  Spasms of the larynx.  A slowed heartbeat.  Low blood pressure. What happens before the procedure?  Follow instructions from your health care provider about eating or drinking restrictions.  Ask your health care provider about:  Changing or stopping your regular medicines. This is especially important if you are taking diabetes medicines or blood thinners.  Taking medicines such as aspirin and ibuprofen. These medicines can thin your blood. Do not take these medicines before your procedure if your health care provider instructs you not to.  Plan to have someone take you home after the procedure.  If you wear dentures, be ready to remove them before the procedure. What happens during the procedure?  To reduce your risk of infection, your health care team will wash or sanitize their hands.  An IV tube will be put in a vein in your hand or arm. You will get medicines and fluids through this tube.  You will be given one or more of the following:  A medicine to help you relax (sedative).  A medicine to numb  the area (local anesthetic). This medicine may be sprayed into your throat. It will make you feel more comfortable and keep you from gagging or coughing during the procedure.  A medicine for pain.  A mouth guard may be placed in your mouth to protect your teeth and to keep you from biting on the endoscope.  You will be asked to lie on your left side.  The endoscope will be lowered down your throat into your esophagus, stomach, and duodenum.  Air will be put into the  endoscope. This will help your health care provider see better.  The lining of your esophagus, stomach, and duodenum will be examined.  Your health care provider may:  Take a tissue sample so it can be looked at in a lab (biopsy).  Remove growths.  Remove objects (foreign bodies) that are stuck.  Treat any bleeding with medicines or other devices that stop tissue from bleeding.  Widen (dilate) or stretch narrowed areas of your esophagus and stomach.  The endoscope will be taken out. The procedure may vary among health care providers and hospitals. What happens after the procedure?  Your blood pressure, heart rate, breathing rate, and blood oxygen level will be monitored often until the medicines you were given have worn off.  Do not eat or drink anything until the numbing medicine has worn off and your gag reflex has returned. This information is not intended to replace advice given to you by your health care provider. Make sure you discuss any questions you have with your health care provider. Document Released: 04/04/2005 Document Revised: 05/09/2016 Document Reviewed: 10/26/2015 Elsevier Interactive Patient Education  2017 [place]. Esophagogastroduodenoscopy, Care After Refer to this sheet in the next few weeks. These instructions provide you with information about caring for yourself after your procedure. Your health care provider may also give you more specific instructions. Your treatment has been planned according to current medical practices, but problems sometimes occur. Call your health care provider if you have any problems or questions after your procedure. What can I expect after the procedure? After the procedure, it is common to have:  A sore throat.  Nausea.  Bloating.  Dizziness.  Fatigue. Follow these instructions at home:  Do not eat or drink anything until the numbing medicine (local anesthetic) has worn off and your gag reflex has returned. You  will know that the local anesthetic has worn off when you can swallow comfortably.  Do not drive for 24 hours if you received a medicine to help you relax (sedative).  If your health care provider took a tissue sample for testing during the procedure, make sure to get your test results. This is your responsibility. Ask your health care provider or the department performing the test when your results will be ready.  Keep all follow-up visits as told by your health care provider. This is important. Contact a health care provider if:  You cannot stop coughing.  You are not urinating.  You are urinating less than usual. Get help right away if:  You have trouble swallowing.  You cannot eat or drink.  You have throat or chest pain that gets worse.  You are dizzy or light-headed.  You faint.  You have nausea or vomiting.  You have chills.  You have a fever.  You have severe abdominal pain.  You have black, tarry, or bloody stools. This information is not intended to replace advice given to you by your health care provider. Make sure you  discuss any questions you have with your health care provider. Document Released: 11/18/2012 Document Revised: 05/09/2016 Document Reviewed: 10/26/2015 Elsevier Interactive Patient Education  2017 [employer]  Esophageal Dilatation Esophageal dilatation is a procedure to open a blocked or narrowed part of the esophagus. The esophagus is the long tube in your throat that carries food and liquid from your mouth to your stomach. The procedure is also called esophageal dilation. You may need this procedure if you have a buildup of scar tissue in your esophagus that makes it difficult, painful, or even impossible to swallow. This can be caused by gastroesophageal reflux disease (GERD). In rare cases, people need this procedure because they have cancer of the esophagus or a problem with the way food moves through the esophagus. Sometimes you may need to  have another dilatation to enlarge the opening of the esophagus gradually. Tell a health care provider about:  Any allergies you have.  All medicines you are taking, including vitamins, herbs, eye drops, creams, and over-the-counter medicines.  Any problems you or family members have had with anesthetic medicines.  Any blood disorders you have.  Any surgeries you have had.  Any medical conditions you have.  Any antibiotic medicines you are required to take before dental procedures. What are the risks? Generally, this is a safe procedure. However, problems can occur and include:  Bleeding from a tear in the lining of the esophagus.  A hole (perforation) in the esophagus. What happens before the procedure?  Do not eat or drink anything after midnight on the night before the procedure or as directed by your health care provider.  Ask your health care provider about changing or stopping your regular medicines. This is especially important if you are taking diabetes medicines or blood thinners.  Plan to have someone take you home after the procedure. What happens during the procedure?  You will be given a medicine that makes you relaxed and sleepy (sedative).  A medicine may be sprayed or gargled to numb the back of the throat.  Your health care provider can use various instruments to do an esophageal dilatation. During the procedure, the instrument used will be placed in your mouth and passed down into your esophagus. Options include:  Simple dilators. This instrument is carefully placed in the esophagus to stretch it.  Guided wire bougies. In this method, a flexible tube (endoscope) is used to insert a wire into the esophagus. The dilator is passed over this wire to enlarge the esophagus. Then the wire is removed.  Balloon dilators. An endoscope with a small balloon at the end is passed down into the esophagus. Inflating the balloon gently stretches the esophagus and opens it  up. What happens after the procedure?  Your blood pressure, heart rate, breathing rate, and blood oxygen level will be monitored often until the medicines you were given have worn off.  Your throat may feel slightly sore and will probably still feel numb. This will improve slowly over time.  You will not be allowed to eat or drink until the throat numbness has resolved.  If this is a same-day procedure, you may be allowed to go home once you have been able to drink, urinate, and sit on the edge of the bed without nausea or dizziness.  If this is a same-day procedure, you should have a friend or family member with you for the next 24 hours after the procedure. This information is not intended to replace advice given to you by  your health care provider. Make sure you discuss any questions you have with your health care provider. Document Released: 01/23/2006 Document Revised: 05/09/2016 Document Reviewed: 04/13/2014 Elsevier Interactive Patient Education  2017 [place].  Colonoscopy, Adult A colonoscopy is an exam to look at the entire large intestine. During the exam, a lubricated, bendable tube is inserted into the anus and then passed into the rectum, colon, and other parts of the large intestine. A colonoscopy is often done as a part of normal colorectal screening or in response to certain symptoms, such as anemia, persistent diarrhea, abdominal pain, and blood in the stool. The exam can help screen for and diagnose medical problems, including:  Tumors.  Polyps.  Inflammation.  Areas of bleeding. Tell a health care provider about:  Any allergies you have.  All medicines you are taking, including vitamins, herbs, eye drops, creams, and over-the-counter medicines.  Any problems you or family members have had with anesthetic medicines.  Any blood disorders you have.  Any surgeries you have had.  Any medical conditions you have.  Any problems you have had passing  stool. What are the risks? Generally, this is a safe procedure. However, problems may occur, including:  Bleeding.  A tear in the intestine.  A reaction to medicines given during the exam.  Infection (rare). What happens before the procedure? Eating and drinking restrictions  Follow instructions from your health care provider about eating and drinking, which may include:  A few days before the procedure - follow a low-fiber diet. Avoid nuts, seeds, dried fruit, raw fruits, and vegetables.  1-3 days before the procedure - follow a clear liquid diet. Drink only clear liquids, such as clear broth or bouillon, black coffee or tea, clear juice, clear soft drinks or sports drinks, gelatin dessert, and popsicles. Avoid any liquids that contain red or purple dye.  On the day of the procedure - do not eat or drink anything during the 2 hours before the procedure, or within the time period that your health care provider recommends. Bowel prep  If you were prescribed an oral bowel prep to clean out your colon:  Take it as told by your health care provider. Starting the day before your procedure, you will need to drink a large amount of medicated liquid. The liquid will cause you to have multiple loose stools until your stool is almost clear or light green.  If your skin or anus gets irritated from diarrhea, you may use these to relieve the irritation:  Medicated wipes, such as adult wet wipes with aloe and vitamin E.  A skin soothing-product like petroleum jelly.  If you vomit while drinking the bowel prep, take a break for up to 60 minutes and then begin the bowel prep again. If vomiting continues and you cannot take the bowel prep without vomiting, call your health care provider. General instructions   Ask your health care provider about changing or stopping your regular medicines. This is especially important if you are taking diabetes medicines or blood thinners.  Plan to have someone  take you home from the hospital or clinic. What happens during the procedure?  An IV tube may be inserted into one of your veins.  You will be given medicine to help you relax (sedative).  To reduce your risk of infection:  Your health care team will wash or sanitize their hands.  Your anal area will be washed with soap.  You will be asked to lie on your side with  your knees bent.  Your health care provider will lubricate a long, thin, flexible tube. The tube will have a camera and a light on the end.  The tube will be inserted into your anus.  The tube will be gently eased through your rectum and colon.  Air will be delivered into your colon to keep it open. You may feel some pressure or cramping.  The camera will be used to take images during the procedure.  A small tissue sample may be removed from your body to be examined under a microscope (biopsy). If any potential problems are found, the tissue will be sent to a lab for testing.  If small polyps are found, your health care provider may remove them and have them checked for cancer cells.  The tube that was inserted into your anus will be slowly removed. The procedure may vary among health care providers and hospitals. What happens after the procedure?  Your blood pressure, heart rate, breathing rate, and blood oxygen level will be monitored until the medicines you were given have worn off.  Do not drive for 24 hours after the exam.  You may have a small amount of blood in your stool.  You may pass gas and have mild abdominal cramping or bloating due to the air that was used to inflate your colon during the exam.  It is up to you to get the results of your procedure. Ask your health care provider, or the department performing the procedure, when your results will be ready. This information is not intended to replace advice given to you by your health care provider. Make sure you discuss any questions you have with your  health care provider. Document Released: 11/29/2000 Document Revised: 10/02/2016 Document Reviewed: 02/13/2016 Elsevier Interactive Patient Education  2017 [employer]  Colonoscopy, Adult, Care After This sheet gives you information about how to care for yourself after your procedure. Your health care provider may also give you more specific instructions. If you have problems or questions, contact your health care provider. What can I expect after the procedure? After the procedure, it is common to have:  A small amount of blood in your stool for 24 hours after the procedure.  Some gas.  Mild abdominal cramping or bloating. Follow these instructions at home: General instructions    For the first 24 hours after the procedure:  Do not drive or use machinery.  Do not sign important documents.  Do not drink alcohol.  Do your regular daily activities at a slower pace than normal.  Eat soft, easy-to-digest foods.  Rest often.  Take over-the-counter or prescription medicines only as told by your health care provider.  It is up to you to get the results of your procedure. Ask your health care provider, or the department performing the procedure, when your results will be ready. Relieving cramping and bloating   Try walking around when you have cramps or feel bloated.  Apply heat to your abdomen as told by your health care provider. Use a heat source that your health care provider recommends, such as a moist heat pack or a heating pad.  Place a towel between your skin and the heat source.  Leave the heat on for 20-30 minutes.  Remove the heat if your skin turns bright red. This is especially important if you are unable to feel pain, heat, or cold. You may have a greater risk of getting burned. Eating and drinking   Drink enough fluid to  keep your urine clear or pale yellow.  Resume your normal diet as instructed by your health care provider. Avoid heavy or fried foods that  are hard to digest.  Avoid drinking alcohol for as long as instructed by your health care provider. Contact a health care provider if:  You have blood in your stool 2-3 days after the procedure. Get help right away if:  You have more than a small spotting of blood in your stool.  You pass large blood clots in your stool.  Your abdomen is swollen.  You have nausea or vomiting.  You have a fever.  You have increasing abdominal pain that is not relieved with medicine. This information is not intended to replace advice given to you by your health care provider. Make sure you discuss any questions you have with your health care provider. Document Released: 07/16/2004 Document Revised: 08/26/2016 Document Reviewed: 02/13/2016 Elsevier Interactive Patient Education  2017 [place] Anesthesia is a term that refers to techniques, procedures, and medicines that help a person stay safe and comfortable during a medical procedure. Monitored anesthesia care, or sedation, is one type of anesthesia. Your anesthesia specialist may recommend sedation if you will be having a procedure that does not require you to be unconscious, such as:  Cataract surgery.  A dental procedure.  A biopsy.  A colonoscopy. During the procedure, you may receive a medicine to help you relax (sedative). There are three levels of sedation:  Mild sedation. At this level, you may feel awake and relaxed. You will be able to follow directions.  Moderate sedation. At this level, you will be sleepy. You may not remember the procedure.  Deep sedation. At this level, you will be asleep. You will not remember the procedure. The more medicine you are given, the deeper your level of sedation will be. Depending on how you respond to the procedure, the anesthesia specialist may change your level of sedation or the type of anesthesia to fit your needs. An anesthesia specialist will monitor you closely  during the procedure. Let your health care provider know about:  Any allergies you have.  All medicines you are taking, including vitamins, herbs, eye drops, creams, and over-the-counter medicines.  Any use of steroids (by mouth or as a cream).  Any problems you or family members have had with sedatives and anesthetic medicines.  Any blood disorders you have.  Any surgeries you have had.  Any medical conditions you have, such as sleep apnea.  Whether you are pregnant or may be pregnant.  Any use of cigarettes, alcohol, or street drugs. What are the risks? Generally, this is a safe procedure. However, problems may occur, including:  Getting too much medicine (oversedation).  Nausea.  Allergic reaction to medicines.  Trouble breathing. If this happens, a breathing tube may be used to help with breathing. It will be removed when you are awake and breathing on your own.  Heart trouble.  Lung trouble. Before the procedure Staying hydrated  Follow instructions from your health care provider about hydration, which may include:  Up to 2 hours before the procedure - you may continue to drink clear liquids, such as water, clear fruit juice, black coffee, and plain tea. Eating and drinking restrictions  Follow instructions from your health care provider about eating and drinking, which may include:  8 hours before the procedure - stop eating heavy meals or foods such as meat, fried foods, or fatty foods.  6 hours  before the procedure - stop eating light meals or foods, such as toast or cereal.  6 hours before the procedure - stop drinking milk or drinks that contain milk.  2 hours before the procedure - stop drinking clear liquids. Medicines  Ask your health care provider about:  Changing or stopping your regular medicines. This is especially important if you are taking diabetes medicines or blood thinners.  Taking medicines such as aspirin and ibuprofen. These medicines  can thin your blood. Do not take these medicines before your procedure if your health care provider instructs you not to. Tests and exams  You will have a physical exam.  You may have blood tests done to show:  How well your kidneys and liver are working.  How well your blood can clot.  General instructions  Plan to have someone take you home from the hospital or clinic.  If you will be going home right after the procedure, plan to have someone with you for 24 hours. What happens during the procedure?  Your blood pressure, heart rate, breathing, level of pain and overall condition will be monitored.  An IV tube will be inserted into one of your veins.  Your anesthesia specialist will give you medicines as needed to keep you comfortable during the procedure. This may mean changing the level of sedation.  The procedure will be performed. After the procedure  Your blood pressure, heart rate, breathing rate, and blood oxygen level will be monitored until the medicines you were given have worn off.  Do not drive for 24 hours if you received a sedative.  You may:  Feel sleepy, clumsy, or nauseous.  Feel forgetful about what happened after the procedure.  Have a sore throat if you had a breathing tube during the procedure.  Vomit. This information is not intended to replace advice given to you by your health care provider. Make sure you discuss any questions you have with your health care provider. Document Released: 08/28/2005 Document Revised: 05/10/2016 Document Reviewed: 03/24/2016 Elsevier Interactive Patient Education  2017 [place], Care After These instructions provide you with information about caring for yourself after your procedure. Your health care provider may also give you more specific instructions. Your treatment has been planned according to current medical practices, but problems sometimes occur. Call your health care provider if  you have any problems or questions after your procedure. What can I expect after the procedure? After your procedure, it is common to:  Feel sleepy for several hours.  Feel clumsy and have poor balance for several hours.  Feel forgetful about what happened after the procedure.  Have poor judgment for several hours.  Feel nauseous or vomit.  Have a sore throat if you had a breathing tube during the procedure. Follow these instructions at home: For at least 24 hours after the procedure:    Do not:  Participate in activities in which you could fall or become injured.  Drive.  Use heavy machinery.  Drink alcohol.  Take sleeping pills or medicines that cause drowsiness.  Make important decisions or sign legal documents.  Take care of children on your own.  Rest. Eating and drinking   Follow the diet that is recommended by your health care provider.  If you vomit, drink water, juice, or soup when you can drink without vomiting.  Make sure you have little or no nausea before eating solid foods. General instructions   Have a responsible adult stay with you  until you are awake and alert.  Take over-the-counter and prescription medicines only as told by your health care provider.  If you smoke, do not smoke without supervision.  Keep all follow-up visits as told by your health care provider. This is important. Contact a health care provider if:  You keep feeling nauseous or you keep vomiting.  You feel light-headed.  You develop a rash.  You have a fever. Get help right away if:  You have trouble breathing. This information is not intended to replace advice given to you by your health care provider. Make sure you discuss any questions you have with your health care provider. Document Released: 03/24/2016 Document Revised: 07/24/2016 Document Reviewed: 03/24/2016 Elsevier Interactive Patient Education  2017 [employer].

## 2017-05-08 ENCOUNTER — Other Ambulatory Visit: Payer: Self-pay

## 2017-05-08 ENCOUNTER — Encounter (HOSPITAL_COMMUNITY)
Admission: RE | Admit: 2017-05-08 | Discharge: 2017-05-08 | Disposition: A | Discharge status: Home / Self Care | Payer: Medicare Other | Source: Ambulatory Visit | Attending: Internal Medicine | Admitting: Internal Medicine

## 2017-05-08 ENCOUNTER — Encounter (HOSPITAL_COMMUNITY): Payer: Self-pay

## 2017-05-08 DIAGNOSIS — R1310 Dysphagia, unspecified: Secondary | ICD-10-CM | POA: Insufficient documentation

## 2017-05-08 DIAGNOSIS — Z01810 Encounter for preprocedural cardiovascular examination: Secondary | ICD-10-CM | POA: Diagnosis not present

## 2017-05-08 DIAGNOSIS — R634 Abnormal weight loss: Secondary | ICD-10-CM | POA: Diagnosis not present

## 2017-05-08 DIAGNOSIS — Z01818 Encounter for other preprocedural examination: Secondary | ICD-10-CM | POA: Diagnosis not present

## 2017-05-08 DIAGNOSIS — R9431 Abnormal electrocardiogram [ECG] [EKG]: Secondary | ICD-10-CM | POA: Insufficient documentation

## 2017-05-08 HISTORY — DX: Gastro-esophageal reflux disease without esophagitis: K21.9

## 2017-05-08 HISTORY — DX: Benign prostatic hyperplasia without lower urinary tract symptoms: N40.0

## 2017-05-08 HISTORY — DX: Anxiety disorder, unspecified: F41.9

## 2017-05-08 HISTORY — DX: Pure hypercholesterolemia, unspecified: E78.00

## 2017-05-08 LAB — CBC WITH DIFFERENTIAL/PLATELET
Basophils Absolute: 0 10*3/uL (ref 0.0–0.1)
Basophils Relative: 1 %
EOS ABS: 0.1 10*3/uL (ref 0.0–0.7)
EOS PCT: 3 %
HCT: 40.7 % (ref 39.0–52.0)
Hemoglobin: 14.2 g/dL (ref 13.0–17.0)
LYMPHS ABS: 1.5 10*3/uL (ref 0.7–4.0)
Lymphocytes Relative: 55 %
MCH: 31.1 pg (ref 26.0–34.0)
MCHC: 34.9 g/dL (ref 30.0–36.0)
MCV: 89.3 fL (ref 78.0–100.0)
MONOS PCT: 8 %
Monocytes Absolute: 0.2 10*3/uL (ref 0.1–1.0)
Neutro Abs: 0.9 10*3/uL — ABNORMAL LOW (ref 1.7–7.7)
Neutrophils Relative %: 34 %
PLATELETS: 142 10*3/uL — AB (ref 150–400)
RBC: 4.56 MIL/uL (ref 4.22–5.81)
RDW: 12.4 % (ref 11.5–15.5)
WBC: 2.8 10*3/uL — ABNORMAL LOW (ref 4.0–10.5)

## 2017-05-08 LAB — BASIC METABOLIC PANEL
Anion gap: 7 (ref 5–15)
BUN: 10 mg/dL (ref 6–20)
CALCIUM: 9.3 mg/dL (ref 8.9–10.3)
CO2: 32 mmol/L (ref 22–32)
Chloride: 98 mmol/L — ABNORMAL LOW (ref 101–111)
Creatinine, Ser: 1.01 mg/dL (ref 0.61–1.24)
GFR calc Af Amer: >60 mL/min (ref >60)
GLUCOSE: 77 mg/dL (ref 65–99)
POTASSIUM: 3.3 mmol/L — AB (ref 3.5–5.1)
Sodium: 137 mmol/L (ref 135–145)

## 2017-05-08 NOTE — Pre-Procedure Instructions (Signed)
Patient in for PAT and was not accompanied by legal guardian, [name]. Called and left her a message about coming to sign patient's consent for his colonoscopy. Also called and spoke with her supervisor, Maurine Minister, he states he will make sure she gets the message and have her come sign consent.

## 2017-05-09 ENCOUNTER — Other Ambulatory Visit (HOSPITAL_COMMUNITY): Payer: Medicare Other

## 2017-05-13 ENCOUNTER — Telehealth: Payer: Self-pay | Admitting: Internal Medicine

## 2017-05-13 NOTE — Telephone Encounter (Signed)
Someone from Short Stay called to say that patient is scheduled with RMR tomorrow and his Potassium is 3.3 and WBS was 2.8

## 2017-05-13 NOTE — Progress Notes (Signed)
Left message with Darl Pikes from Dr. Luvenia Starch office to notify [name] of patient's abnormal lab results from 05-08-17: K+ 3.3 and WBC 2.8. Will notify Dr. Marcos Eke as well.

## 2017-05-13 NOTE — Telephone Encounter (Signed)
Kyle Wade is aware. I  have tried to call pt- he has not answered his phone and does not have a voicemail.

## 2017-05-13 NOTE — Progress Notes (Signed)
Please make sure he is taking his potassium supplementation BID. Looks like he is on BID. For today, increase to 20 meq BID and then back to BID as already prescribed. Procedure is 5/30.

## 2017-05-14 ENCOUNTER — Ambulatory Visit (HOSPITAL_COMMUNITY): Payer: Medicare Other | Admitting: Anesthesiology

## 2017-05-14 ENCOUNTER — Encounter (HOSPITAL_COMMUNITY)
Admission: RE | Disposition: A | Discharge status: Home / Self Care | Payer: Self-pay | Source: Ambulatory Visit | Attending: Internal Medicine

## 2017-05-14 ENCOUNTER — Ambulatory Visit (HOSPITAL_COMMUNITY)
Admission: RE | Admit: 2017-05-14 | Discharge: 2017-05-14 | Disposition: A | Discharge status: Home / Self Care | Payer: Medicare Other | Source: Ambulatory Visit | Attending: Internal Medicine | Admitting: Internal Medicine

## 2017-05-14 ENCOUNTER — Encounter (HOSPITAL_COMMUNITY): Payer: Self-pay | Admitting: *Deleted

## 2017-05-14 DIAGNOSIS — R1310 Dysphagia, unspecified: Secondary | ICD-10-CM | POA: Diagnosis not present

## 2017-05-14 DIAGNOSIS — K219 Gastro-esophageal reflux disease without esophagitis: Secondary | ICD-10-CM | POA: Insufficient documentation

## 2017-05-14 DIAGNOSIS — F419 Anxiety disorder, unspecified: Secondary | ICD-10-CM | POA: Diagnosis not present

## 2017-05-14 DIAGNOSIS — Z79899 Other long term (current) drug therapy: Secondary | ICD-10-CM | POA: Diagnosis not present

## 2017-05-14 DIAGNOSIS — I10 Essential (primary) hypertension: Secondary | ICD-10-CM | POA: Insufficient documentation

## 2017-05-14 DIAGNOSIS — F209 Schizophrenia, unspecified: Secondary | ICD-10-CM | POA: Diagnosis not present

## 2017-05-14 DIAGNOSIS — Z1211 Encounter for screening for malignant neoplasm of colon: Secondary | ICD-10-CM | POA: Diagnosis not present

## 2017-05-14 DIAGNOSIS — R634 Abnormal weight loss: Secondary | ICD-10-CM | POA: Insufficient documentation

## 2017-05-14 DIAGNOSIS — K449 Diaphragmatic hernia without obstruction or gangrene: Secondary | ICD-10-CM | POA: Insufficient documentation

## 2017-05-14 DIAGNOSIS — F17210 Nicotine dependence, cigarettes, uncomplicated: Secondary | ICD-10-CM | POA: Diagnosis not present

## 2017-05-14 DIAGNOSIS — E7800 Pure hypercholesterolemia, unspecified: Secondary | ICD-10-CM | POA: Diagnosis not present

## 2017-05-14 DIAGNOSIS — K317 Polyp of stomach and duodenum: Secondary | ICD-10-CM | POA: Insufficient documentation

## 2017-05-14 DIAGNOSIS — G4733 Obstructive sleep apnea (adult) (pediatric): Secondary | ICD-10-CM | POA: Diagnosis not present

## 2017-05-14 DIAGNOSIS — K5730 Diverticulosis of large intestine without perforation or abscess without bleeding: Secondary | ICD-10-CM | POA: Insufficient documentation

## 2017-05-14 DIAGNOSIS — N400 Enlarged prostate without lower urinary tract symptoms: Secondary | ICD-10-CM | POA: Insufficient documentation

## 2017-05-14 DIAGNOSIS — F79 Unspecified intellectual disabilities: Secondary | ICD-10-CM | POA: Insufficient documentation

## 2017-05-14 HISTORY — PX: MALONEY DILATION: SHX5535

## 2017-05-14 HISTORY — PX: BIOPSY: SHX5522

## 2017-05-14 HISTORY — PX: ESOPHAGOGASTRODUODENOSCOPY (EGD) WITH PROPOFOL: SHX5813

## 2017-05-14 HISTORY — PX: COLONOSCOPY WITH PROPOFOL: SHX5780

## 2017-05-14 SURGERY — COLONOSCOPY WITH PROPOFOL
Anesthesia: Monitor Anesthesia Care

## 2017-05-14 MED ORDER — ONDANSETRON HCL 4 MG/2ML IJ SOLN
INTRAMUSCULAR | Status: AC
  Filled 2017-05-14: qty 2

## 2017-05-14 MED ORDER — LACTATED RINGERS IV SOLN
INTRAVENOUS | Status: DC
  Administered 2017-05-14: 14:00:00 via INTRAVENOUS

## 2017-05-14 MED ORDER — PROPOFOL 500 MG/50ML IV EMUL
INTRAVENOUS | Status: DC | PRN
  Administered 2017-05-14: 100 ug/kg/min via INTRAVENOUS
  Administered 2017-05-14: 15:00:00 via INTRAVENOUS

## 2017-05-14 MED ORDER — PROPOFOL 10 MG/ML IV BOLUS
INTRAVENOUS | Status: AC
  Filled 2017-05-14: qty 20

## 2017-05-14 MED ORDER — MIDAZOLAM HCL 2 MG/2ML IJ SOLN
1.0000 mg | INTRAMUSCULAR | Status: AC
  Administered 2017-05-14: 2 mg via INTRAVENOUS

## 2017-05-14 MED ORDER — FENTANYL CITRATE (PF) 100 MCG/2ML IJ SOLN
INTRAMUSCULAR | Status: AC
  Filled 2017-05-14: qty 2

## 2017-05-14 MED ORDER — MIDAZOLAM HCL 2 MG/2ML IJ SOLN
INTRAMUSCULAR | Status: AC
  Filled 2017-05-14: qty 2

## 2017-05-14 MED ORDER — ONDANSETRON HCL 4 MG/2ML IJ SOLN
4.0000 mg | Freq: Once | INTRAMUSCULAR | Status: AC
  Administered 2017-05-14: 4 mg via INTRAVENOUS

## 2017-05-14 MED ORDER — MIDAZOLAM HCL 5 MG/5ML IJ SOLN
INTRAMUSCULAR | Status: DC | PRN
  Administered 2017-05-14: 2 mg via INTRAVENOUS

## 2017-05-14 MED ORDER — LIDOCAINE VISCOUS 2 % MT SOLN
OROMUCOSAL | Status: DC | PRN
  Administered 2017-05-14: 5 mL via OROMUCOSAL

## 2017-05-14 MED ORDER — FENTANYL CITRATE (PF) 100 MCG/2ML IJ SOLN
25.0000 ug | Freq: Once | INTRAMUSCULAR | Status: AC
  Administered 2017-05-14: 25 ug via INTRAVENOUS

## 2017-05-14 MED ORDER — CHLORHEXIDINE GLUCONATE CLOTH 2 % EX PADS: 6.0000 | MEDICATED_PAD | Freq: Once | CUTANEOUS | Status: DC

## 2017-05-14 MED ORDER — LIDOCAINE VISCOUS 2 % MT SOLN
OROMUCOSAL | Status: AC
  Filled 2017-05-14: qty 15

## 2017-05-14 NOTE — Op Note (Signed)
[facility] Patient Name: Kyle Wade Procedure Date: 05/14/2017 2:30 PM MRN: [id number] Date of Birth: 02-19-1956 Attending MD: Gennette Pac , MD CSN: [id number] Age: 61 Admit Type: Outpatient Procedure:                Ileo-colonoscopy Indications:              Weight loss Providers:                Gennette Pac, MD, Criselda Peaches. Patsy Lager, RN,                            Burke Keels, Technician Referring MD:              Medicines:                Propofol per Anesthesia Complications:            No immediate complications. Estimated Blood Loss:     Estimated blood loss: none. Procedure:                Pre-Anesthesia Assessment:                           - Prior to the procedure, a History and Physical                            was performed, and patient medications and                            allergies were reviewed. The patient's tolerance of                            previous anesthesia was also reviewed. The risks                            and benefits of the procedure and the sedation                            options and risks were discussed with the patient.                            All questions were answered, and informed consent                            was obtained. Prior Anticoagulants: The patient has                            taken no previous anticoagulant or antiplatelet                            agents. ASA Grade Assessment: III - A patient with                            severe systemic disease. After reviewing the risks  and benefits, the patient was deemed in                            satisfactory condition to undergo the procedure.                           After obtaining informed consent, the colonoscope                            was passed under direct vision. Throughout the                            procedure, the patient's blood pressure, pulse, and                            oxygen saturations were  monitored continuously. The                            [id number]) was introduced through the and                            advanced to the 5 cm into the ileum. The terminal                            ileum, ileocecal valve, appendiceal orifice, and                            rectum were photographed. The colonoscopy was                            performed without difficulty. The patient tolerated                            the procedure well. The quality of the bowel                            preparation was adequate. Scope In: 2:37:40 PM Scope Out: 2:50:05 PM Scope Withdrawal Time: 0 hours 6 minutes 18 seconds  Total Procedure Duration: 0 hours 12 minutes 25 seconds  Findings:      The perianal and digital rectal examinations were normal.      Multiple small-mouthed diverticula were found in the ascending colon.      The exam was otherwise without abnormality on direct and retroflexion       views. The distal 5 cm of terminal ileal mucosa also appeared normal. Impression:               - Diverticulosis in the ascending colon.                           - The examination was otherwise normal on direct                            and retroflexion views.                           -  No specimens collected. Moderate Sedation:      Moderate (conscious) sedation was personally administered by an       anesthesia professional. The following parameters were monitored: oxygen       saturation, heart rate, blood pressure, respiratory rate, EKG, adequacy       of pulmonary ventilation, and response to care. Total physician       intraservice time was 33 minutes. Recommendation:           - Patient has a contact number available for                            emergencies. The signs and symptoms of potential                            delayed complications were discussed with the                            patient. Return to normal activities tomorrow.                            Written  discharge instructions were provided to the                            patient.                           - Resume previous diet.                           - Continue present medications.                           - Repeat colonoscopy in 10 years for screening                            purposes.                           - Return to GI office in 1 month. See EGD report. Procedure Code(s):        --- Professional ---                           [phone], Colonoscopy, flexible; diagnostic, including                            collection of specimen(s) by brushing or washing,                            when performed (separate procedure) Diagnosis Code(s):        --- Professional ---                           R63.4, Abnormal weight loss                           K57.30, Diverticulosis of large intestine without  perforation or abscess without bleeding CPT copyright 2016 American Medical Association. All rights reserved. The codes documented in this report are preliminary and upon coder review may  be revised to meet current compliance requirements. Gerrit Friends. Arelyn Gauer, MD Gennette Pac, MD 05/14/2017 2:59:11 PM This report has been signed electronically. Number of Addenda: 0

## 2017-05-14 NOTE — Discharge Instructions (Signed)
Colonoscopy Discharge Instructions  Read the instructions outlined below and refer to this sheet in the next few weeks. These discharge instructions provide you with general information on caring for yourself after you leave the hospital. Your doctor may also give you specific instructions. While your treatment has been planned according to the most current medical practices available, unavoidable complications occasionally occur. If you have any problems or questions after discharge, call Dr. Gala Romney at [phone]. ACTIVITY  You may resume your regular activity, but move at a slower pace for the next 24 hours.   Take frequent rest periods for the next 24 hours.   Walking will help get rid of the air and reduce the bloated feeling in your belly (abdomen).   No driving for 24 hours (because of the medicine (anesthesia) used during the test).    Do not sign any important legal documents or operate any machinery for 24 hours (because of the anesthesia used during the test).  NUTRITION  Drink plenty of fluids.   You may resume your normal diet as instructed by your doctor.   Begin with a light meal and progress to your normal diet. Heavy or fried foods are harder to digest and may make you feel sick to your stomach (nauseated).   Avoid alcoholic beverages for 24 hours or as instructed.  MEDICATIONS  You may resume your normal medications unless your doctor tells you otherwise.  WHAT YOU CAN EXPECT TODAY  Some feelings of bloating in the abdomen.   Passage of more gas than usual.   Spotting of blood in your stool or on the toilet paper.  IF YOU HAD POLYPS REMOVED DURING THE COLONOSCOPY:  No aspirin products for 7 days or as instructed.   No alcohol for 7 days or as instructed.   Eat a soft diet for the next 24 hours.  FINDING OUT THE RESULTS OF YOUR TEST Not all test results are available during your visit. If your test results are not back during the visit, make an appointment  with your caregiver to find out the results. Do not assume everything is normal if you have not heard from your caregiver or the medical facility. It is important for you to follow up on all of your test results.  SEEK IMMEDIATE MEDICAL ATTENTION IF:  You have more than a spotting of blood in your stool.   Your belly is swollen (abdominal distention).   You are nauseated or vomiting.   You have a temperature over 101.   You have abdominal pain or discomfort that is severe or gets worse throughout the day.   EGD Discharge instructions Please read the instructions outlined below and refer to this sheet in the next few weeks. These discharge instructions provide you with general information on caring for yourself after you leave the hospital. Your doctor may also give you specific instructions. While your treatment has been planned according to the most current medical practices available, unavoidable complications occasionally occur. If you have any problems or questions after discharge, please call your doctor. ACTIVITY  You may resume your regular activity but move at a slower pace for the next 24 hours.   Take frequent rest periods for the next 24 hours.   Walking will help expel (get rid of) the air and reduce the bloated feeling in your abdomen.   No driving for 24 hours (because of the anesthesia (medicine) used during the test).   You may shower.   Do not sign any  important legal documents or operate any machinery for 24 hours (because of the anesthesia used during the test).  NUTRITION  Drink plenty of fluids.   You may resume your normal diet.   Begin with a light meal and progress to your normal diet.   Avoid alcoholic beverages for 24 hours or as instructed by your caregiver.  MEDICATIONS  You may resume your normal medications unless your caregiver tells you otherwise.  WHAT YOU CAN EXPECT TODAY  You may experience abdominal discomfort such as a feeling of  fullness or gas pains.  FOLLOW-UP  Your doctor will discuss the results of your test with you.  SEEK IMMEDIATE MEDICAL ATTENTION IF ANY OF THE FOLLOWING OCCUR:  Excessive nausea (feeling sick to your stomach) and/or vomiting.   Severe abdominal pain and distention (swelling).   Trouble swallowing.   Temperature over 101 F (37.8 C).   Rectal bleeding or vomiting of blood.    Office visit with [place] in one month  Further recommendations to follow pending review of pathology report  Repeat colonoscopy in 10 years for screening purposes

## 2017-05-14 NOTE — H&P (Signed)
@LOGO @   Primary Care Physician:  [name]., MD Primary Gastroenterologist:  [name]  Pre-Procedure History & Physical: HPI:  Kyle Wade is a 61 y.o. male here for  here for further evaluation of weight loss and esophageal dysphagia.   Seen in the office previously. Here to undergo EGD with ED and diagnostic colonoscopy.  Past Medical History:  Diagnosis Date  . Acute cholecystitis 01/25/2016  . Anxiety   . BPH (benign prostatic hyperplasia)   . Chronic vomiting   . GERD (gastroesophageal reflux disease)   . Hypercholesteremia   . Hypertension   . Mental retardation   . OSA (obstructive sleep apnea)    no cpap machine; could not tolerate.  . Schatzki's ring   . Schizophrenia [facility])     Past Surgical History:  Procedure Laterality Date  . CHOLECYSTECTOMY N/A 01/26/2016   Procedure: LAPAROSCOPIC CHOLECYSTECTOMY;  Surgeon: [name], MD;  Location: [facility] ORS;  Service: General;  Laterality: N/A;  . COLONOSCOPY N/A 09/21/2014   [name]: right-sided diverticulosis, internal hemorrhoids. Repeat in 10 years.   . ESOPHAGOGASTRODUODENOSCOPY N/A 09/21/2014   [name]: Subtle non-critical Schatzki's ring s/p 3 F dilation, query occult cervical esophageal web. Hiatal hernia. Question gastroparesis.   [name] MALONEY DILATION N/A 09/21/2014   Procedure: Elease Hashimoto DILATION;  Surgeon: [name], MD;  Location: [facility] END[facility] SUITE;  Service: Endoscopy;  Laterality: N/A;  . None    . SAVORY DILATION N/A 09/21/2014   Procedure: SAVORY DILATION;  Surgeon: [name], MD;  Location: [facility] SUITE;  Service: Endoscopy;  Laterality: N/A;    Prior to Admission medications   Medication Sig Start Date End Date Taking? Authorizing Provider  amLODipine (NORVASC) 5 MG tablet Take 5 mg by mouth every morning.   Yes [provider]  buPROPion (WELLBUTRIN SR) 150 MG 12 hr tablet Take 150 mg by mouth 2 (two) times daily.   Yes [provider]  cyclobenzaprine (FLEXERIL) 10 MG  tablet Take 10 mg by mouth at bedtime.   Yes [provider]  DEXILANT 60 MG capsule TAKE 1 CAPSULE BY MOUTH ONCE A DAY. 04/02/17  Yes Anice Paganini, NP  docusate sodium (COLACE) 100 MG capsule Take 100 mg by mouth 2 (two) times daily.   Yes [provider]  fluticasone (FLONASE) 50 MCG/ACT nasal spray Place 2 sprays into both nostrils daily.   Yes [provider]  gemfibrozil (LOPID) 600 MG tablet Take 600 mg by mouth 2 (two) times daily.   Yes [provider]  mirtazapine (REMERON) 15 MG tablet Take 15 mg by mouth at bedtime.   Yes [provider]  oxybutynin (DITROPAN) 5 MG tablet Take 5 mg by mouth every morning. Reported on 02/21/2016   Yes [provider]  potassium chloride (MICRO-K) 10 MEQ CR capsule Take 10 mEq by mouth 2 (two) times daily.   Yes [provider]  ranitidine (ZANTAC) 300 MG tablet Take 300 mg by mouth at bedtime.   Yes [provider]  risperiDONE (RISPERDAL) 1 MG tablet Take 1 mg by mouth at bedtime.   Yes [provider]  tamsulosin (FLOMAX) 0.4 MG CAPS capsule Take 0.4 mg by mouth every evening.    Yes [provider]  triamterene-hydrochlorothiazide (MAXZIDE-25) 37.5-25 MG per tablet Take 1 tablet by mouth every morning.   Yes [provider]  Vitamin D, Ergocalciferol, (DRISDOL) 50000 UNITS CAPS capsule Take 50,000 Units by mouth every 14 (fourteen) days.  Yes [provider]    Allergies as of 04/03/2017  . (No Known Allergies)    Family History  Problem Relation Age of Onset  . Colon cancer Unknown        unknown    Social History   Social History  . Marital status: Single    Spouse name: N/A  . Number of children: N/A  . Years of education: N/A   Occupational History  . Not on file.   Social History Main Topics  . Smoking status: Current Every Day Smoker    Packs/day: 0.50    Years: 30.00    Types: Cigarettes  . Smokeless tobacco: Never  Used     Comment: 4 cigarettes a day  . Alcohol use No  . Drug use: No  . Sexual activity: No   Other Topics Concern  . Not on file   Social History Narrative  . No narrative on file    Review of Systems: See HPI, otherwise negative ROS  Physical Exam: BP (!) 147/92   Pulse 82   Temp 97.3 F (36.3 C) (Oral)   Resp 18   SpO2 97%  General:   Alert,   pleasant and cooperative in NAD; accompanied by caregiver Skin:  Intact without significant lesions or rashes. Eyes:  Sclera clear, no icterus.   Conjunctiva pink. Ears:  Normal auditory acuity. Nose:  No deformity, discharge,  or lesions. Mouth:  No deformity or lesions. Neck:  Supple; no masses or thyromegaly. No significant cervical adenopathy. Lungs:  Clear throughout to auscultation.   No wheezes, crackles, or rhonchi. No acute distress. Heart:  Regular rate and rhythm; no murmurs, clicks, rubs,  or gallops. Abdomen: Non-distended, normal bowel sounds.  Soft and nontender without appreciable mass or hepatosplenomegaly.  Pulses:  Normal pulses noted. Extremities:  Without clubbing or edema.  Impression:   61 year old gentleman with esophageal dysphagia and weight loss. Agree, further evaluation warranted.   Recommendations: I have offered the patient EGD with EGD and diagnostic colonoscopy today per plan. The risks, benefits, limitations, imponderables and alternatives regarding both EGD and colonoscopy have been reviewed with the patient. Questions have been answered. All parties agreeable.    Notice: This dictation was prepared with Dragon dictation along with smaller phrase technology. Any transcriptional errors that result from this process are unintentional and may not be corrected upon review.

## 2017-05-14 NOTE — Anesthesia Postprocedure Evaluation (Signed)
Anesthesia Post Note  Patient: Kyle Wade  Procedure(s) Performed: Procedure(s) (LRB): COLONOSCOPY WITH PROPOFOL (N/A) ESOPHAGOGASTRODUODENOSCOPY (EGD) WITH PROPOFOL (N/A) MALONEY DILATION (N/A) BIOPSY  Patient location during evaluation: PACU Anesthesia Type: MAC Level of consciousness: sedated Pain management: pain level controlled Vital Signs Assessment: post-procedure vital signs reviewed and stable Respiratory status: spontaneous breathing, nonlabored ventilation and respiratory function stable Cardiovascular status: blood pressure returned to baseline Postop Assessment: no signs of nausea or vomiting Anesthetic complications: no     Last Vitals:  Vitals:   05/14/17 1310  BP: (!) 147/92  Pulse: 82  Resp: 18  Temp: 36.3 C    Last Pain:  Vitals:   05/14/17 1310  TempSrc: Oral                 [name] J

## 2017-05-14 NOTE — Op Note (Signed)
[facility] Patient Name: Kyle Wade Procedure Date: 05/14/2017 2:08 PM MRN: [id number] Date of Birth: March 21, 1956 Attending MD: Gennette Pac , MD CSN: [id number] Age: 61 Admit Type: Outpatient Procedure:                Upper GI endoscopy Indications:              Dysphagia, weight loss Providers:                Gennette Pac, MD, Loma Messing B. Patsy Lager, RN,                            Burke Keels, Technician Referring MD:              Medicines:                Propofol per Anesthesia Complications:            No immediate complications. Estimated Blood Loss:     Estimated blood loss was minimal. Procedure:                Pre-Anesthesia Assessment:                           - Prior to the procedure, a History and Physical                            was performed, and patient medications and                            allergies were reviewed. The patient's tolerance of                            previous anesthesia was also reviewed. The risks                            and benefits of the procedure and the sedation                            options and risks were discussed with the patient.                            All questions were answered, and informed consent                            was obtained. Prior Anticoagulants: The patient has                            taken no previous anticoagulant or antiplatelet                            agents. ASA Grade Assessment: III - A patient with                            severe systemic disease. After reviewing the risks  and benefits, the patient was deemed in                            satisfactory condition to undergo the procedure.                           After obtaining informed consent, the endoscope was                            passed under direct vision. Throughout the                            procedure, the patient's blood pressure, pulse, and                            oxygen  saturations were monitored continuously. The                            [id number]) scope was introduced through the                            and advanced to the second part of duodenum. The                            upper GI endoscopy was accomplished without                            difficulty. The patient tolerated the procedure                            well. Scope In: 2:23:45 PM Scope Out: 2:30:08 PM Total Procedure Duration: 0 hours 6 minutes 23 seconds  Findings:      The examined esophagus was normal.      A small hiatal hernia was present.      A few 4 mm pedunculated and sessile polyps were found in the stomach.       This was biopsied with a cold forceps for histology. Estimated blood       loss was minimal.      The duodenal bulb and second portion of the duodenum were normal. The       scope was withdrawn. Dilation was performed with a Maloney dilator with       no resistance at 56 Fr. The dilation site was examined following       endoscope reinsertion and showed no change. Estimated blood loss: none.       biopsy of one of the gastric polyps taken.. Impression:               - Normal esophagus. Dilation performed.                           - Small hiatal hernia.                           - A few gastric polyps. Biopsied.                           -  Normal duodenal bulb and second portion of the                            duodenum. Moderate Sedation:      Moderate (conscious) sedation was personally administered by an       anesthesia professional. The following parameters were monitored: oxygen       saturation, heart rate, blood pressure, respiratory rate, EKG, adequacy       of pulmonary ventilation, and response to care. Total physician       intraservice time was 13 minutes. Recommendation:           - Patient has a contact number available for                            emergencies. The signs and symptoms of potential                             delayed complications were discussed with the                            patient. Return to normal activities tomorrow.                            Written discharge instructions were provided to the                            patient.                           - Resume previous diet.                           - Continue present medications.                           - No repeat upper endoscopy.                           - Return to GI office after studies are complete.                            See colonoscopy report. Procedure Code(s):        --- Professional ---                           [phone], Esophagogastroduodenoscopy, flexible,                            transoral; with biopsy, single or multiple                           [id number], Dilation of esophagus, by unguided sound or                            bougie, single or multiple passes Diagnosis Code(s):        --- Professional ---  K44.9, Diaphragmatic hernia without obstruction or                            gangrene                           K31.7, Polyp of stomach and duodenum                           R13.10, Dysphagia, unspecified CPT copyright 2016 American Medical Association. All rights reserved. The codes documented in this report are preliminary and upon coder review may  be revised to meet current compliance requirements. Gerrit Friends. Shelsea Hangartner, MD Gennette Pac, MD 05/14/2017 2:36:35 PM This report has been signed electronically. Number of Addenda: 0

## 2017-05-14 NOTE — Transfer of Care (Signed)
Immediate Anesthesia Transfer of Care Note  Patient: Kyle Wade  Procedure(s) Performed: Procedure(s) with comments: COLONOSCOPY WITH PROPOFOL (N/A) ESOPHAGOGASTRODUODENOSCOPY (EGD) WITH PROPOFOL (N/A) MALONEY DILATION (N/A) BIOPSY - gastric polyp bx  Patient Location: PACU  Anesthesia Type:MAC  Level of Consciousness: drowsy and patient cooperative  Airway & Oxygen Therapy: Patient Spontanous Breathing and Patient connected to face mask oxygen  Post-op Assessment: Report given to RN, Post -op Vital signs reviewed and stable and Patient moving all extremities  Post vital signs: Reviewed and stable  Last Vitals:  Vitals:   05/14/17 1310  BP: (!) 147/92  Pulse: 82  Resp: 18  Temp: 36.3 C    Last Pain:  Vitals:   05/14/17 1310  TempSrc: Oral      Patients Stated Pain Goal: 5 (01/60/10 9323)  Complications: No apparent anesthesia complications

## 2017-05-14 NOTE — Anesthesia Preprocedure Evaluation (Signed)
Anesthesia Evaluation  Patient identified by MRN, date of birth, ID band Patient awake  General Assessment Comment:Follows simple commands  Reviewed: Allergy & Precautions, NPO status , Patient's Chart, lab work & pertinent test results  Airway Mallampati: II  TM Distance: >3 FB     Dental  (+) Poor Dentition   Pulmonary sleep apnea , Current Smoker,    breath sounds clear to auscultation       Cardiovascular hypertension,  Rhythm:Regular Rate:Normal     Neuro/Psych PSYCHIATRIC DISORDERS (mental retardation) Anxiety Schizophrenia    GI/Hepatic GERD  Medicated,  Endo/Other    Renal/GU      Musculoskeletal   Abdominal   Peds  Hematology   Anesthesia Other Findings   Reproductive/Obstetrics                             Anesthesia Physical Anesthesia Plan  ASA: III  Anesthesia Plan: MAC   Post-op Pain Management:    Induction: Intravenous  Airway Management Planned: Simple Face Mask  Additional Equipment:   Intra-op Plan:   Post-operative Plan:   Informed Consent: I have reviewed the patients History and Physical, chart, labs and discussed the procedure including the risks, benefits and alternatives for the proposed anesthesia with the patient or authorized representative who has indicated his/her understanding and acceptance.     Plan Discussed with:   Anesthesia Plan Comments:         Anesthesia Quick Evaluation

## 2017-05-15 ENCOUNTER — Ambulatory Visit (HOSPITAL_COMMUNITY): Payer: Medicare Other | Admitting: Oncology

## 2017-05-15 ENCOUNTER — Encounter (HOSPITAL_COMMUNITY): Payer: Medicare Other | Attending: Oncology | Admitting: Oncology

## 2017-05-15 ENCOUNTER — Encounter (HOSPITAL_COMMUNITY): Payer: Self-pay | Admitting: Oncology

## 2017-05-15 DIAGNOSIS — D708 Other neutropenia: Secondary | ICD-10-CM

## 2017-05-15 DIAGNOSIS — Z720 Tobacco use: Secondary | ICD-10-CM | POA: Diagnosis not present

## 2017-05-15 DIAGNOSIS — R42 Dizziness and giddiness: Secondary | ICD-10-CM | POA: Diagnosis not present

## 2017-05-15 DIAGNOSIS — D709 Neutropenia, unspecified: Secondary | ICD-10-CM | POA: Insufficient documentation

## 2017-05-15 DIAGNOSIS — F209 Schizophrenia, unspecified: Secondary | ICD-10-CM | POA: Diagnosis not present

## 2017-05-15 DIAGNOSIS — F29 Unspecified psychosis not due to a substance or known physiological condition: Secondary | ICD-10-CM | POA: Diagnosis not present

## 2017-05-15 NOTE — Progress Notes (Signed)
[name]    HEMATOLOGY/ONCOLOGY CONSULTATION NOTE  Date of Service: 05/15/2017  Patient Care Team: [name]., MD as PCP - General (Family Medicine) [name], Gerrit Friends, MD as Consulting Physician (Gastroenterology) Dr Lynnell Grain (Psychiatry)  CHIEF COMPLAINTS/PURPOSE OF CONSULTATION:  Leucopenia/Neutropenia  HISTORY OF PRESENTING ILLNESS:   Kyle Wade is a wonderful 61 y.o. male who has been referred to [place] by Dr .Margo Common, Oley Balm., MD/Dr Rourk for evaluation and management of chronic leukopenia/neutropenia.  Patient has a history of hypertension, mental retardation, sleep apnea not using CPAP, history of Schatzki's ring status post esophageal dilatation and schizophrenia.  Patient is a very poor historian due to cognitive deficits and is unable to provide any coherent medical history or information regarding his symptoms. Information is provided by his accompanying group home staff.   He has had mild leukopenia since at least November 2016 when his WBC count was 3.4k and on 02/04/2016 when it was 3.6k with an ANC of 1400. ANC has reduced since then.  INTERVAL HISTORY: Today he presented for follow up with his ALF aid.   His most recent CBC from 05/08/17 demonstrated a WBC 2.8K, hemoglobin 14.2 g/dL, hematocrit 45.4%, MCV 89.3, platelet count 142K, differential was normal except for ANC of 900.   Patient recently saw GI again due to history of weight loss and dysphagia. He had his esophageal dilatation in colonoscopy performed on 05/14/17.   Patient also complains of intermittent dizziness for which she's had a neurological workup. Per his ALF aid, he did have scans done of his brain and they were negative.   She denies patient having any recent/recurrent infections. Otherwise review of systems was negative.  MEDICAL HISTORY:  Past Medical History:  Diagnosis Date  . Acute cholecystitis 01/25/2016  . Anxiety   . BPH (benign prostatic hyperplasia)   . Chronic vomiting   . GERD  (gastroesophageal reflux disease)   . Hypercholesteremia   . Hypertension   . Mental retardation   . OSA (obstructive sleep apnea)    no cpap machine; could not tolerate.  . Schatzki's ring   . Schizophrenia (HCC)     SURGICAL HISTORY: Past Surgical History:  Procedure Laterality Date  . CHOLECYSTECTOMY N/A 01/26/2016   Procedure: LAPAROSCOPIC CHOLECYSTECTOMY;  Surgeon: [name], MD;  Location: [facility];  Service: General;  Laterality: N/A;  . COLONOSCOPY N/A 09/21/2014   [name]: right-sided diverticulosis, internal hemorrhoids. Repeat in 10 years.   . ESOPHAGOGASTRODUODENOSCOPY N/A 09/21/2014   [name]: Subtle non-critical Schatzki's ring s/p 96 F dilation, query occult cervical esophageal web. Hiatal hernia. Question gastroparesis.   [name] MALONEY DILATION N/A 09/21/2014   Procedure: Elease Hashimoto DILATION;  Surgeon: [name], MD;  Location: [facility];  Service: Endoscopy;  Laterality: N/A;  . None    . SAVORY DILATION N/A 09/21/2014   Procedure: SAVORY DILATION;  Surgeon: [name], MD;  Location: [facility];  Service: Endoscopy;  Laterality: N/A;    SOCIAL HISTORY: Social History   Social History  . Marital status: Single    Spouse name: N/A  . Number of children: N/A  . Years of education: N/A   Occupational History  . Not on file.   Social History Main Topics  . Smoking status: Current Every Day Smoker    Packs/day: 0.50    Years: 30.00    Types: Cigarettes  . Smokeless tobacco: Never Used     Comment: 4 cigarettes a day  . Alcohol use No  .  Drug use: No  . Sexual activity: No   Other Topics Concern  . Not on file   Social History Narrative  . No narrative on file    FAMILY HISTORY: Family History  Problem Relation Age of Onset  . Colon cancer Unknown        unknown    ALLERGIES:  has No Known Allergies.  MEDICATIONS:  Current Outpatient Prescriptions  Medication Sig Dispense Refill  . amLODipine (NORVASC) 5 MG tablet Take 5 mg by  mouth every morning.    [name] buPROPion (WELLBUTRIN SR) 150 MG 12 hr tablet Take 150 mg by mouth 2 (two) times daily.    . cyclobenzaprine (FLEXERIL) 10 MG tablet Take 10 mg by mouth at bedtime.    [name] DEXILANT 60 MG capsule TAKE 1 CAPSULE BY MOUTH ONCE A DAY. 30 capsule 11  . docusate sodium (COLACE) 100 MG capsule Take 100 mg by mouth 2 (two) times daily.    . fluticasone (FLONASE) 50 MCG/ACT nasal spray Place 2 sprays into both nostrils daily.    [name] gemfibrozil (LOPID) 600 MG tablet Take 600 mg by mouth 2 (two) times daily.    . mirtazapine (REMERON) 15 MG tablet Take 15 mg by mouth at bedtime.    [name] oxybutynin (DITROPAN) 5 MG tablet Take 5 mg by mouth every morning. Reported on 02/21/2016    . potassium chloride (MICRO-K) 10 MEQ CR capsule Take 10 mEq by mouth 2 (two) times daily.    . ranitidine (ZANTAC) 300 MG tablet Take 300 mg by mouth at bedtime.    . risperiDONE (RISPERDAL) 1 MG tablet Take 1 mg by mouth at bedtime.    . tamsulosin (FLOMAX) 0.4 MG CAPS capsule Take 0.4 mg by mouth every evening.     . triamterene-hydrochlorothiazide (MAXZIDE-25) 37.5-25 MG per tablet Take 1 tablet by mouth every morning.    . Vitamin D, Ergocalciferol, (DRISDOL) 50000 UNITS CAPS capsule Take 50,000 Units by mouth every 14 (fourteen) days.     No current facility-administered medications for this visit.     REVIEW OF SYSTEMS:    10 Point review of Systems was done is negative except as noted above.   PHYSICAL EXAMINATION: . Vitals:   05/15/17 1303  BP: 134/70  Pulse: 72  Resp: 18  Temp: 98.4 F (36.9 C)   Filed Weights   05/15/17 1303  Weight: 145 lb 6.4 oz (66 kg)   .Body mass index is 23.12 kg/m.  Physical Exam  Constitutional: He is oriented to person, place, and time and well-developed, well-nourished, and in no distress. No distress.  HENT:  Head: Normocephalic and atraumatic.  Mouth/Throat: No oropharyngeal exudate.  Eyes: Conjunctivae are normal. Pupils are equal, round, and  reactive to light. No scleral icterus.  Neck: Normal range of motion. Neck supple. No JVD present.  Cardiovascular: Normal rate, regular rhythm and normal heart sounds.  Exam reveals no gallop and no friction rub.   No murmur heard. Pulmonary/Chest: Breath sounds normal. No respiratory distress. He has no wheezes. He has no rales.  Abdominal: Soft. Bowel sounds are normal. He exhibits no distension. There is no tenderness. There is no guarding.  Musculoskeletal: He exhibits no edema or tenderness.  Lymphadenopathy:    He has no cervical adenopathy.  Neurological: He is alert and oriented to person, place, and time. No cranial nerve deficit.  Skin: Skin is warm and dry. No rash noted. No erythema. No pallor.  Psychiatric: Affect and judgment normal.  LABORATORY DATA:  I have reviewed the data as listed  . CBC Latest Ref Rng & Units 05/08/2017 10/30/2016 09/25/2016  WBC 4.0 - 10.5 K/uL 2.8(L) - 3.2(L)  Hemoglobin 13.0 - 17.0 g/dL 95.6 - 21.3  Hematocrit 39.0 - 52.0 % 40.7 44.3 41.3  Platelets 150 - 400 K/uL 142(L) - 140   . CBC    Component Value Date/Time   WBC 2.8 (L) 05/08/2017 1343   RBC 4.56 05/08/2017 1343   HGB 14.2 05/08/2017 1343   HCT 40.7 05/08/2017 1343   HCT 44.3 10/30/2016 1143   PLT 142 (L) 05/08/2017 1343   MCV 89.3 05/08/2017 1343   MCH 31.1 05/08/2017 1343   MCHC 34.9 05/08/2017 1343   RDW 12.4 05/08/2017 1343   LYMPHSABS 1.5 05/08/2017 1343   MONOABS 0.2 05/08/2017 1343   EOSABS 0.1 05/08/2017 1343   BASOSABS 0.0 05/08/2017 1343    . CMP Latest Ref Rng & Units 05/08/2017 10/30/2016 01/27/2016  Glucose 65 - 99 mg/dL 77 08(M) 578(I)  BUN 6 - 20 mg/dL 10 14 18   Creatinine 0.61 - 1.24 mg/dL 6.96 2.95 2.84(X)  Sodium 135 - 145 mmol/L 137 137 135  Potassium 3.5 - 5.1 mmol/L 3.3(L) 3.8 3.9  Chloride 101 - 111 mmol/L 98(L) 98(L) 99(L)  CO2 22 - 32 mmol/L 32 33(H) 28  Calcium 8.9 - 10.3 mg/dL 9.3 9.6 3.2(G)  Total Protein 6.5 - 8.1 g/dL - 7.7 -  Total  Bilirubin 0.3 - 1.2 mg/dL - 0.8 -  Alkaline Phos 38 - 126 U/L - 90 -  AST 15 - 41 U/L - 31 -  ALT 17 - 63 U/L - 26 -     RADIOGRAPHIC STUDIES: I have personally reviewed the radiological images as listed and agreed with the findings in the report. No results found.  ASSESSMENT & PLAN:   61 year old African-American male with mental retardation and schizophrenia with leukopenia/neutropenia.  Patient noted to have leukopenia since at least November 2016 and his ANC has dropped from about 1400 in 01/2016 down to 900 on 09/25/2016. Most recent ANC from 05/08/17 has been stable at 900.  On exam today he has no lymphadenopathy or hepatosplenomegaly to suggest a lymphoproliferative process. No risk for viral infections such as HIV/hepatitis per ALF aid.  He was previously taken off of thorazine which was suspected to cause his neutropenia. However he is currently taking risperidal and mirtazepine which are both known to cause neutropenia as well. He may continue to take these medications since it is necessary for his psych issues.  Will continue monitoring his WBC. Repeat CBC on next visit.   Plan - RTC in 6 months with CBC.

## 2017-05-15 NOTE — Patient Instructions (Addendum)
[facility] at [facility] Discharge Instructions  RECOMMENDATIONS MADE BY THE CONSULTANT AND ANY TEST RESULTS WILL BE SENT TO YOUR REFERRING PHYSICIAN.  You were seen today by Dr. Louise Zhou Follow up in 6 months with labs   Thank you for choosing [facility] at [facility] to provide your oncology and hematology care.  To afford each patient quality time with our provider, please arrive at least 15 minutes before your scheduled appointment time.    If you have a lab appointment with the Cancer Center please come in thru the  Main Entrance and check in at the main information desk  You need to re-schedule your appointment should you arrive 10 or more minutes late.  We strive to give you quality time with our providers, and arriving late affects you and other patients whose appointments are after yours.  Also, if you no show three or more times for appointments you may be dismissed from the clinic at the providers discretion.     Again, thank you for choosing [facility].  Our hope is that these requests will decrease the amount of time that you wait before being seen by our physicians.       _____________________________________________________________  Should you have questions after your visit to [facility], please contact our office at [phone] between the hours of 8:30 a.m. and 4:30 p.m.  Voicemails left after 4:30 p.m. will not be returned until the following business day.  For prescription refill requests, have your pharmacy contact our office.       Resources For Cancer Patients and their Caregivers ? American Cancer Society: Can assist with transportation, wigs, general needs, runs Look Good Feel Better.        1-888-227-6333 ? Cancer Care: Provides financial assistance, online support groups, medication/co-pay assistance.  1-800-813-HOPE (4673) ? [facility] Assists  [place] cancer patients and their families through emotional , educational and financial support.  336-427-4357 ? [place] DSS Where to apply for food stamps, Medicaid and utility assistance. 336-342-1394 ? [facility]: Transportation to medical appointments. 336-347-2287 ? Social Security Administration: May apply for disability if have a Stage IV cancer. 336-342-7796 1-800-772-1213 ? [place] Aging, Disability and Transit Services: Assists with nutrition, care and transit needs. 336-349-2343  Cancer Center Support Programs: @[email or web address]@ > Cancer Support Group  2nd Tuesday of the month 1pm-2pm, Journey Room  > Creative Journey  3rd Tuesday of the month 1130am-1pm, Journey Room  > Look Good Feel Better  1st Wednesday of the month 10am-12 noon, Journey Room (Call American Cancer Society to register 1-800-395-5775)    

## 2017-05-16 ENCOUNTER — Encounter (HOSPITAL_COMMUNITY): Payer: Self-pay | Admitting: Internal Medicine

## 2017-05-18 ENCOUNTER — Encounter: Payer: Self-pay | Admitting: Internal Medicine

## 2017-05-18 NOTE — Progress Notes (Signed)
.  rmr 

## 2017-05-26 DIAGNOSIS — I059 Rheumatic mitral valve disease, unspecified: Secondary | ICD-10-CM | POA: Diagnosis not present

## 2017-05-26 DIAGNOSIS — K219 Gastro-esophageal reflux disease without esophagitis: Secondary | ICD-10-CM | POA: Diagnosis not present

## 2017-05-26 DIAGNOSIS — R7301 Impaired fasting glucose: Secondary | ICD-10-CM | POA: Diagnosis not present

## 2017-05-26 DIAGNOSIS — F209 Schizophrenia, unspecified: Secondary | ICD-10-CM | POA: Diagnosis not present

## 2017-05-26 DIAGNOSIS — I10 Essential (primary) hypertension: Secondary | ICD-10-CM | POA: Diagnosis not present

## 2017-06-11 DIAGNOSIS — F209 Schizophrenia, unspecified: Secondary | ICD-10-CM | POA: Diagnosis not present

## 2017-07-03 ENCOUNTER — Ambulatory Visit: Payer: Medicare Other | Admitting: Gastroenterology

## 2017-07-09 DIAGNOSIS — B351 Tinea unguium: Secondary | ICD-10-CM | POA: Diagnosis not present

## 2017-07-09 DIAGNOSIS — L110 Acquired keratosis follicularis: Secondary | ICD-10-CM | POA: Diagnosis not present

## 2017-07-09 DIAGNOSIS — I739 Peripheral vascular disease, unspecified: Secondary | ICD-10-CM | POA: Diagnosis not present

## 2017-07-10 ENCOUNTER — Ambulatory Visit (INDEPENDENT_AMBULATORY_CARE_PROVIDER_SITE_OTHER): Payer: Medicare Other | Admitting: Gastroenterology

## 2017-07-10 ENCOUNTER — Encounter: Payer: Self-pay | Admitting: Gastroenterology

## 2017-07-10 VITALS — BP 147/84 | HR 94 | Temp 97.4°F | Ht 67.0 in | Wt 142.8 lb

## 2017-07-10 DIAGNOSIS — R634 Abnormal weight loss: Secondary | ICD-10-CM | POA: Diagnosis not present

## 2017-07-10 DIAGNOSIS — K5900 Constipation, unspecified: Secondary | ICD-10-CM | POA: Insufficient documentation

## 2017-07-10 MED ORDER — POLYETHYLENE GLYCOL 3350 17 GM/SCOOP PO POWD: ORAL | 3 refills | Status: DC

## 2017-07-10 NOTE — Assessment & Plan Note (Signed)
With straining. Add Miralax once daily each evening, as caretaker feels this will encourage compliance more than a morning capsule such as Linzess or Amitiza that has strict dosing schedules.

## 2017-07-10 NOTE — Patient Instructions (Signed)
Start taking Miralax each evening.   Continue the ensure 2 cans a day.   I will see you in 6 weeks!

## 2017-07-10 NOTE — Assessment & Plan Note (Signed)
Steady weight loss noted over past several years, with recent TCS/EGD on file. Seems to have slowed now. CTA early 2017 negative for chronic mesenteric ischemia. Weight seems to be at a plateau. Continue follow-up with hematology for leukopenia (med-related likely), continue boost/ensure supplementation, follow-up closely in 6 weeks.

## 2017-07-10 NOTE — Progress Notes (Signed)
Referring Provider: [name]., MD Primary Care Physician:  [name]., MD Primary GI: [name]   Chief Complaint  Patient presents with  . Abdominal Pain    HPI:   Kyle Wade is a 61 y.o. male presenting today with a history of  history of weight loss, dyspepsia, and an extensive evaluation previously. Due to his cognitive status (mentally challenged), it has been historicallydifficult to obtain a complete history. Ultimately, a HIDA scan was performed 01/25/16 that showed acute cholecystitis. CTA had also been ordered, which was negative for mesenteric ischemia. He ultimately had a cholecystectomy 01/26/16 by Dr. Lovell Sheehan. Due to persistent leukopenia, I referred him to Hematology. It was felt this could be related to Thioridazine and recommended close monitoring due to potential for significant agranulocytosis.   56 lbs lost since 2015 but appears weight has slowed down. 143 in April, 142 today. Colonoscopy/EGD/empiric dilation recently performed and overall unrevealing.   States he had to strain this morning. Ensure BID. Likes his "milkshake".   Past Medical History:  Diagnosis Date  . Acute cholecystitis 01/25/2016  . Anxiety   . BPH (benign prostatic hyperplasia)   . Chronic vomiting   . GERD (gastroesophageal reflux disease)   . Hypercholesteremia   . Hypertension   . Mental retardation   . OSA (obstructive sleep apnea)    no cpap machine; could not tolerate.  . Schatzki's ring   . Schizophrenia [facility])     Past Surgical History:  Procedure Laterality Date  . BIOPSY  05/14/2017   Procedure: BIOPSY;  Surgeon: [name], MD;  [facility];  Service: Endoscopy;;  gastric polyp bx  . CHOLECYSTECTOMY N/A 01/26/2016   Procedure: LAPAROSCOPIC CHOLECYSTECTOMY;  Surgeon: [name], MD;  Location: [facility];  Service: General;  Laterality: N/A;  . COLONOSCOPY N/A 09/21/2014   [name]: right-sided diverticulosis, internal hemorrhoids. Repeat in 10  years.   . COLONOSCOPY WITH PROPOFOL N/A 05/14/2017   Procedure: COLONOSCOPY WITH PROPOFOL;  Surgeon: [name], MD;  [facility];  Service: Endoscopy;  Laterality: N/A;  . ESOPHAGOGASTRODUODENOSCOPY N/A 09/21/2014   [name]: Subtle non-critical Schatzki's ring s/p 56 F dilation, query occult cervical esophageal web. Hiatal hernia. Question gastroparesis.   [name] ESOPHAGOGASTRODUODENOSCOPY (EGD) WITH PROPOFOL N/A 05/14/2017   Procedure: ESOPHAGOGASTRODUODENOSCOPY (EGD) WITH PROPOFOL;  Surgeon: [name], MD;  [facility];  Service: Endoscopy;  Laterality: N/A;  . Elease Hashimoto DILATION N/A 09/21/2014   Procedure: Elease Hashimoto DILATION;  Surgeon: [name], MD;  [facility];  Service: Endoscopy;  Laterality: N/A;  . Elease Hashimoto DILATION N/A 05/14/2017   Procedure: Elease Hashimoto DILATION;  Surgeon: [name], MD;  [facility];  Service: Endoscopy;  Laterality: N/A;  . None    . SAVORY DILATION N/A 09/21/2014   Procedure: SAVORY DILATION;  Surgeon: [name], MD;  [facility];  Service: Endoscopy;  Laterality: N/A;    Current Outpatient Prescriptions  Medication Sig Dispense Refill  . amLODipine (NORVASC) 5 MG tablet Take 5 mg by mouth every morning.    [name] buPROPion (WELLBUTRIN SR) 150 MG 12 hr tablet Take 150 mg by mouth 2 (two) times daily.    . cyclobenzaprine (FLEXERIL) 10 MG tablet Take 10 mg by mouth at bedtime.    [name] DEXILANT 60 MG capsule TAKE 1 CAPSULE BY MOUTH ONCE A DAY. 30 capsule 11  . docusate sodium (COLACE) 100 MG capsule Take  100 mg by mouth 2 (two) times daily.    . fluticasone (FLONASE) 50 MCG/ACT nasal spray Place 2 sprays into both nostrils daily.    [name] gemfibrozil (LOPID) 600 MG tablet Take 600 mg by mouth 2 (two) times daily.    . mirtazapine (REMERON) 15 MG tablet Take 15 mg by mouth at bedtime.    [name] oxybutynin (DITROPAN) 5 MG tablet Take 5 mg by mouth every morning. Reported on 02/21/2016    . potassium chloride  (MICRO-K) 10 MEQ CR capsule Take 10 mEq by mouth 2 (two) times daily.    . ranitidine (ZANTAC) 300 MG tablet Take 300 mg by mouth at bedtime.    . risperiDONE (RISPERDAL) 1 MG tablet Take 1 mg by mouth at bedtime.    . tamsulosin (FLOMAX) 0.4 MG CAPS capsule Take 0.4 mg by mouth every evening.     . triamterene-hydrochlorothiazide (MAXZIDE-25) 37.5-25 MG per tablet Take 1 tablet by mouth every morning.    . Vitamin D, Ergocalciferol, (DRISDOL) 50000 UNITS CAPS capsule Take 50,000 Units by mouth every 14 (fourteen) days.     No current facility-administered medications for this visit.     Allergies as of 07/10/2017  . (No Known Allergies)    Family History  Problem Relation Age of Onset  . Colon cancer Unknown        unknown    Social History   Social History  . Marital status: Single    Spouse name: N/A  . Number of children: N/A  . Years of education: N/A   Social History Main Topics  . Smoking status: Current Every Day Smoker    Packs/day: 0.50    Years: 30.00    Types: Cigarettes  . Smokeless tobacco: Never Used     Comment: 4 cigarettes a day  . Alcohol use No  . Drug use: No  . Sexual activity: No   Other Topics Concern  . None   Social History Narrative  . None    Review of Systems: Limited due to cognitive status.   Physical Exam: BP (!) 147/84   Pulse 94   Temp (!) 97.4 F (36.3 C) (Oral)   Ht 5\' 7"  (1.702 m)   Wt 142 lb 12.8 oz (64.8 kg)   BMI 22.37 kg/m  General:   Alert and oriented. No distress noted. Pleasant and cooperative.  Head:  Normocephalic and atraumatic. Eyes:  Conjuctiva clear without scleral icterus. Mouth:  Oral mucosa pink and moist.  Abdomen:  +BS, soft, non-tender and non-distended. No rebound or guarding. No HSM or masses noted. Msk:  Symmetrical without gross deformities. Normal posture. Extremities:  Without edema. Neurologic:  Alert and  oriented x4 Psych:  Alert and cooperative. Normal mood and affect.

## 2017-07-10 NOTE — Progress Notes (Signed)
CC'D TO PCP °

## 2017-09-04 ENCOUNTER — Encounter: Payer: Self-pay | Admitting: Gastroenterology

## 2017-09-04 ENCOUNTER — Ambulatory Visit (INDEPENDENT_AMBULATORY_CARE_PROVIDER_SITE_OTHER): Payer: Medicare Other | Admitting: Gastroenterology

## 2017-09-04 VITALS — BP 146/81 | HR 87 | Temp 96.4°F | Ht 65.0 in | Wt 143.4 lb

## 2017-09-04 DIAGNOSIS — R634 Abnormal weight loss: Secondary | ICD-10-CM | POA: Diagnosis not present

## 2017-09-04 DIAGNOSIS — K5900 Constipation, unspecified: Secondary | ICD-10-CM

## 2017-09-04 NOTE — Patient Instructions (Addendum)
Continue Ensure twice a day.   Continue Dexilant each morning and Miralax each evening.   I am glad your weight is stable.   We will see you in 4 months.   Keep appointment with Hematology on Nov 20, 2017.

## 2017-09-04 NOTE — Progress Notes (Signed)
Referring Provider: [name]., MD Primary Care Physician:  [name]., MD Primary GI: [name]   Chief Complaint  Patient presents with  . Dysphagia    HPI:   Kyle Wade is a 61 y.o. male presenting today with a history of weight loss, dyspepsia, and an extensive evaluation previously. Due to his cognitive status (mentally challenged), it has been historicallydifficult to obtain a complete history. Ultimately, a HIDA scan was performed 01/25/16 that showed acute cholecystitis. CTA had also been ordered, which was negative for mesenteric ischemia. He ultimately had a cholecystectomy 01/26/16 by Dr. Lovell Sheehan. Due to persistent leukopenia, I referred him to Hematology. It was felt this could be related to Thioridazine and recommended close monitoring due to potential for significant agranulocytosis.   56 lbs lost since 2015 but appears weight has slowed down. 143 in April, 142 July, and 143 today. . Colonoscopy/EGD/empiric dilation recently performed and overall unrevealing. Caretaker is present with him as well today. She states at times he notes abdominal pain, but difficult historian. He seems happy, has a girlfriend per caretaker, and has an excellent appetite. He loves his "milkshake", as he calls the ensure. Likes the chocolate better than vanilla. No constipation, straining, rectal bleeding. No N/V.   Past Medical History:  Diagnosis Date  . Acute cholecystitis 01/25/2016  . Anxiety   . BPH (benign prostatic hyperplasia)   . Chronic vomiting   . GERD (gastroesophageal reflux disease)   . Hypercholesteremia   . Hypertension   . Mental retardation   . OSA (obstructive sleep apnea)    no cpap machine; could not tolerate.  . Schatzki's ring   . Schizophrenia [facility])     Past Surgical History:  Procedure Laterality Date  . BIOPSY  05/14/2017   Procedure: BIOPSY;  Surgeon: [name], MD;  [facility];  Service: Endoscopy;;  gastric polyp bx  .  CHOLECYSTECTOMY N/A 01/26/2016   Procedure: LAPAROSCOPIC CHOLECYSTECTOMY;  Surgeon: [name], MD;  Location: [facility];  Service: General;  Laterality: N/A;  . COLONOSCOPY N/A 09/21/2014   [name]: right-sided diverticulosis, internal hemorrhoids. Repeat in 10 years.   . COLONOSCOPY WITH PROPOFOL N/A 05/14/2017   [name]: diverticulosis in ascending colon, otherwise normal  . ESOPHAGOGASTRODUODENOSCOPY N/A 09/21/2014   [name]: Subtle non-critical Schatzki's ring s/p 56 F dilation, query occult cervical esophageal web. Hiatal hernia. Question gastroparesis.   [name] ESOPHAGOGASTRODUODENOSCOPY (EGD) WITH PROPOFOL N/A 05/14/2017   [name]: normal esophagus s/p empiric dilation, benign gastric polyps, normal duodenum  . MALONEY DILATION N/A 09/21/2014   Procedure: Elease Hashimoto DILATION;  Surgeon: [name], MD;  [facility];  Service: Endoscopy;  Laterality: N/A;  . Elease Hashimoto DILATION N/A 05/14/2017   Procedure: Elease Hashimoto DILATION;  Surgeon: [name], MD;  [facility];  Service: Endoscopy;  Laterality: N/A;  . None    . SAVORY DILATION N/A 09/21/2014   Procedure: SAVORY DILATION;  Surgeon: [name], MD;  [facility];  Service: Endoscopy;  Laterality: N/A;    Current Outpatient Prescriptions  Medication Sig Dispense Refill  . amLODipine (NORVASC) 5 MG tablet Take 5 mg by mouth every morning.    [name] buPROPion (WELLBUTRIN SR) 150 MG 12 hr tablet Take 150 mg by mouth 2 (two) times daily.    . cyclobenzaprine (FLEXERIL) 10 MG tablet Take 10 mg by mouth at bedtime.    [name] DEXILANT 60 MG capsule TAKE 1 CAPSULE  BY MOUTH ONCE A DAY. 30 capsule 11  . docusate sodium (COLACE) 100 MG capsule Take 100 mg by mouth 2 (two) times daily.    . fluticasone (FLONASE) 50 MCG/ACT nasal spray Place 2 sprays into both nostrils daily.    [name] gemfibrozil (LOPID) 600 MG tablet Take 600 mg by mouth 2 (two) times daily.    . mirtazapine (REMERON) 15 MG tablet Take 15 mg by mouth at  bedtime.    [name] oxybutynin (DITROPAN) 5 MG tablet Take 5 mg by mouth every morning. Reported on 02/21/2016    . polyethylene glycol powder (GLYCOLAX/MIRALAX) powder Take 17 grams mixed in 8 ounces of water each evening. 255 g 3  . potassium chloride (MICRO-K) 10 MEQ CR capsule Take 10 mEq by mouth 2 (two) times daily.    . ranitidine (ZANTAC) 300 MG tablet Take 300 mg by mouth at bedtime.    . risperiDONE (RISPERDAL) 1 MG tablet Take 1 mg by mouth at bedtime.    . tamsulosin (FLOMAX) 0.4 MG CAPS capsule Take 0.4 mg by mouth every evening.     . triamterene-hydrochlorothiazide (MAXZIDE-25) 37.5-25 MG per tablet Take 1 tablet by mouth every morning.    . Vitamin D, Ergocalciferol, (DRISDOL) 50000 UNITS CAPS capsule Take 50,000 Units by mouth every 14 (fourteen) days.     No current facility-administered medications for this visit.     Allergies as of 09/04/2017  . (No Known Allergies)    Family History  Problem Relation Age of Onset  . Colon cancer Unknown        unknown    Social History   Social History  . Marital status: Single    Spouse name: N/A  . Number of children: N/A  . Years of education: N/A   Social History Main Topics  . Smoking status: Current Every Day Smoker    Packs/day: 0.50    Years: 30.00    Types: Cigarettes  . Smokeless tobacco: Never Used     Comment: 4 cigarettes a day  . Alcohol use No  . Drug use: No  . Sexual activity: No   Other Topics Concern  . None   Social History Narrative  . None    Review of Systems: Limited due to cognitive status.   Physical Exam: BP (!) 146/81   Pulse 87   Temp (!) 96.4 F (35.8 C) (Oral)   Ht  (1.651 m)   Wt 143 lb 6.4 oz (65 kg)   BMI 23.86 kg/m  General:   Alert and oriented. No distress noted. Pleasant and cooperative.  Head:  Normocephalic and atraumatic. Eyes:  Conjuctiva clear without scleral icterus. Mouth:  Oral mucosa pink and moist.  Abdomen:  +BS, soft, non-tender and non-distended. No  rebound or guarding. No HSM or masses noted. Msk:  Symmetrical without gross deformities. Normal posture. Extremities:  Without edema. Neurologic:  Alert and  oriented x4 Psych:  Alert and cooperative. Normal mood and affect.

## 2017-09-07 NOTE — Assessment & Plan Note (Signed)
61 year old male with extensive evaluation as noted in HPI, now with plateau of weight and doing well from a GI standpoint. Will continue PPI, boost/ensure supplements, Miralax each evening, and follow-up with hematology due to chronic leukopenia. Will have him return in 4 months.

## 2017-09-08 NOTE — Progress Notes (Signed)
CC'D TO PCP °

## 2017-09-29 DIAGNOSIS — F209 Schizophrenia, unspecified: Secondary | ICD-10-CM | POA: Diagnosis not present

## 2017-09-30 ENCOUNTER — Other Ambulatory Visit: Payer: Self-pay | Admitting: Gastroenterology

## 2017-10-01 DIAGNOSIS — L110 Acquired keratosis follicularis: Secondary | ICD-10-CM | POA: Diagnosis not present

## 2017-10-01 DIAGNOSIS — B351 Tinea unguium: Secondary | ICD-10-CM | POA: Diagnosis not present

## 2017-10-01 DIAGNOSIS — I739 Peripheral vascular disease, unspecified: Secondary | ICD-10-CM | POA: Diagnosis not present

## 2017-11-03 DIAGNOSIS — F209 Schizophrenia, unspecified: Secondary | ICD-10-CM | POA: Diagnosis not present

## 2017-11-19 ENCOUNTER — Other Ambulatory Visit (HOSPITAL_COMMUNITY): Payer: Self-pay | Admitting: *Deleted

## 2017-11-19 DIAGNOSIS — D708 Other neutropenia: Secondary | ICD-10-CM

## 2017-11-20 ENCOUNTER — Other Ambulatory Visit: Payer: Self-pay

## 2017-11-20 ENCOUNTER — Encounter (HOSPITAL_COMMUNITY): Payer: Medicare Other

## 2017-11-20 ENCOUNTER — Encounter (HOSPITAL_COMMUNITY): Payer: Medicare Other | Attending: Oncology | Admitting: Oncology

## 2017-11-20 ENCOUNTER — Encounter (HOSPITAL_COMMUNITY): Payer: Self-pay | Admitting: Oncology

## 2017-11-20 VITALS — BP 138/75 | HR 73 | Temp 98.0°F | Resp 18 | Wt 145.6 lb

## 2017-11-20 DIAGNOSIS — E7800 Pure hypercholesterolemia, unspecified: Secondary | ICD-10-CM | POA: Insufficient documentation

## 2017-11-20 DIAGNOSIS — N400 Enlarged prostate without lower urinary tract symptoms: Secondary | ICD-10-CM | POA: Diagnosis not present

## 2017-11-20 DIAGNOSIS — Z79899 Other long term (current) drug therapy: Secondary | ICD-10-CM | POA: Insufficient documentation

## 2017-11-20 DIAGNOSIS — F209 Schizophrenia, unspecified: Secondary | ICD-10-CM | POA: Insufficient documentation

## 2017-11-20 DIAGNOSIS — K219 Gastro-esophageal reflux disease without esophagitis: Secondary | ICD-10-CM | POA: Diagnosis not present

## 2017-11-20 DIAGNOSIS — D709 Neutropenia, unspecified: Secondary | ICD-10-CM | POA: Diagnosis not present

## 2017-11-20 DIAGNOSIS — D708 Other neutropenia: Secondary | ICD-10-CM

## 2017-11-20 DIAGNOSIS — Z23 Encounter for immunization: Secondary | ICD-10-CM

## 2017-11-20 DIAGNOSIS — I10 Essential (primary) hypertension: Secondary | ICD-10-CM | POA: Insufficient documentation

## 2017-11-20 DIAGNOSIS — Z720 Tobacco use: Secondary | ICD-10-CM

## 2017-11-20 DIAGNOSIS — F17210 Nicotine dependence, cigarettes, uncomplicated: Secondary | ICD-10-CM | POA: Diagnosis not present

## 2017-11-20 DIAGNOSIS — D702 Other drug-induced agranulocytosis: Secondary | ICD-10-CM

## 2017-11-20 DIAGNOSIS — F419 Anxiety disorder, unspecified: Secondary | ICD-10-CM | POA: Diagnosis not present

## 2017-11-20 DIAGNOSIS — G4733 Obstructive sleep apnea (adult) (pediatric): Secondary | ICD-10-CM | POA: Insufficient documentation

## 2017-11-20 DIAGNOSIS — F79 Unspecified intellectual disabilities: Secondary | ICD-10-CM | POA: Diagnosis not present

## 2017-11-20 LAB — CBC WITH DIFFERENTIAL/PLATELET
BASOS ABS: 0 10*3/uL (ref 0.0–0.1)
BASOS PCT: 1 %
EOS ABS: 0.1 10*3/uL (ref 0.0–0.7)
Eosinophils Relative: 2 %
HEMATOCRIT: 43 % (ref 39.0–52.0)
HEMOGLOBIN: 14.2 g/dL (ref 13.0–17.0)
Lymphocytes Relative: 47 %
Lymphs Abs: 1.6 10*3/uL (ref 0.7–4.0)
MCH: 30 pg (ref 26.0–34.0)
MCHC: 33 g/dL (ref 30.0–36.0)
MCV: 90.7 fL (ref 78.0–100.0)
Monocytes Absolute: 0.5 10*3/uL (ref 0.1–1.0)
Monocytes Relative: 14 %
NEUTROS ABS: 1.2 10*3/uL — AB (ref 1.7–7.7)
NEUTROS PCT: 36 %
Platelets: 134 10*3/uL — ABNORMAL LOW (ref 150–400)
RBC: 4.74 MIL/uL (ref 4.22–5.81)
RDW: 12.8 % (ref 11.5–15.5)
WBC: 3.3 10*3/uL — AB (ref 4.0–10.5)

## 2017-11-20 MED ORDER — INFLUENZA VAC SPLIT QUAD 0.5 ML IM SUSY
PREFILLED_SYRINGE | INTRAMUSCULAR | Status: AC
  Filled 2017-11-20: qty 0.5

## 2017-11-20 MED ORDER — INFLUENZA VAC SPLIT QUAD 0.5 ML IM SUSY
0.5000 mL | PREFILLED_SYRINGE | Freq: Once | INTRAMUSCULAR | Status: AC
  Administered 2017-11-20: 0.5 mL via INTRAMUSCULAR

## 2017-11-20 NOTE — Patient Instructions (Addendum)
[facility] at [facility] Discharge Instructions  RECOMMENDATIONS MADE BY THE CONSULTANT AND ANY TEST RESULTS WILL BE SENT TO YOUR REFERRING PHYSICIAN.  You were seen today by Dr. Ralene Cork Follow up in 6 months with labs You got your FLU shot today  Thank you for choosing [facility] at [facility] to provide your oncology and hematology care.  To afford each patient quality time with our provider, please arrive at least 15 minutes before your scheduled appointment time.    If you have a lab appointment with the Cancer Center please come in thru the  Main Entrance and check in at the main information desk  You need to re-schedule your appointment should you arrive 10 or more minutes late.  We strive to give you quality time with our providers, and arriving late affects you and other patients whose appointments are after yours.  Also, if you no show three or more times for appointments you may be dismissed from the clinic at the providers discretion.     Again, thank you for choosing [facility].  Our hope is that these requests will decrease the amount of time that you wait before being seen by our physicians.       _____________________________________________________________  Should you have questions after your visit to [facility], please contact our office at [phone] between the hours of 8:30 a.m. and 4:30 p.m.  Voicemails left after 4:30 p.m. will not be returned until the following business day.  For prescription refill requests, have your pharmacy contact our office.       Resources For Cancer Patients and their Caregivers ? American Cancer Society: Can assist with transportation, wigs, general needs, runs Look Good Feel Better.        919-469-1568 ? Cancer Care: Provides financial assistance, online support groups, medication/co-pay assistance.  1-800-813-HOPE 262 110 3556) ? [facility] Assists [place] cancer patients and their families through emotional , educational and financial support.  (705)119-4867 ? [place] DSS Where to apply for food stamps, Medicaid and utility assistance. 902-798-3187 ? [facility]: Transportation to medical appointments. 431-005-1816 ? Social Security Administration: May apply for disability if have a Stage IV cancer. 6400397481 6237975682 ? [employer], Disability and Transit Services: Assists with nutrition, care and transit needs. 601 349 5231  Cancer Center Support Programs: @10RELATIVEDAYS @ > Cancer Support Group  2nd Tuesday of the month 1pm-2pm, Journey Room  > Creative Journey  3rd Tuesday of the month 1130am-1pm, Journey Room  > Look Good Feel Better  1st Wednesday of the month 10am-12 noon, Journey Room (Call American Cancer Society to register [phone])

## 2017-11-20 NOTE — Progress Notes (Signed)
Kaleab L Davee presents today for injection per MD orders. Fluarix administered IM in left deltoid. Administration without incident. Patient tolerated well. Patient tolerated treatment without incidence. Patient discharged ambulatory and in stable condition from clinic. Patient to follow up as scheduled.

## 2017-11-20 NOTE — Progress Notes (Signed)
[name]    HEMATOLOGY/ONCOLOGY CONSULTATION NOTE  Date of Service: 11/20/2017  Patient Care Team: [name]., MD as PCP - General (Family Medicine) [name], Gerrit Friends, MD as Consulting Physician (Gastroenterology) Dr Lynnell Grain (Psychiatry)  CHIEF COMPLAINTS/PURPOSE OF CONSULTATION:  Leucopenia/Neutropenia  HISTORY OF PRESENTING ILLNESS:   Kyle Wade is a wonderful 61 y.o. male who has been referred to [place] by Dr .Margo Common, Oley Balm., MD/Dr Rourk for evaluation and management of chronic leukopenia/neutropenia.  Patient has a history of hypertension, mental retardation, sleep apnea not using CPAP, history of Schatzki's ring status post esophageal dilatation and schizophrenia.  Patient is a very poor historian due to cognitive deficits and is unable to provide any coherent medical history or information regarding his symptoms. Information is provided by his accompanying group home staff.   He has had mild leukopenia since at least November 2016 when his WBC count was 3.4k and on 02/04/2016 when it was 3.6k with an ANC of 1400. ANC has reduced since then.  INTERVAL HISTORY: Today he presented for follow up with his ALF aid.  He states that he has been doing well. He had some LUQ abdominal pain a few weeks ago but it has since resolved. He denies any recent infections, chest pain, shortness of breath, N/V/D, focal weakness.   MEDICAL HISTORY:  Past Medical History:  Diagnosis Date  . Acute cholecystitis 01/25/2016  . Anxiety   . BPH (benign prostatic hyperplasia)   . Chronic vomiting   . GERD (gastroesophageal reflux disease)   . Hypercholesteremia   . Hypertension   . Mental retardation   . OSA (obstructive sleep apnea)    no cpap machine; could not tolerate.  . Schatzki's ring   . Schizophrenia (HCC)     SURGICAL HISTORY: Past Surgical History:  Procedure Laterality Date  . BIOPSY  05/14/2017   Procedure: BIOPSY;  Surgeon: [name], MD;  [facility];   Service: Endoscopy;;  gastric polyp bx  . CHOLECYSTECTOMY N/A 01/26/2016   Procedure: LAPAROSCOPIC CHOLECYSTECTOMY;  Surgeon: [name], MD;  Location: [facility];  Service: General;  Laterality: N/A;  . COLONOSCOPY N/A 09/21/2014   [name]: right-sided diverticulosis, internal hemorrhoids. Repeat in 10 years.   . COLONOSCOPY WITH PROPOFOL N/A 05/14/2017   [name]: diverticulosis in ascending colon, otherwise normal  . ESOPHAGOGASTRODUODENOSCOPY N/A 09/21/2014   [name]: Subtle non-critical Schatzki's ring s/p 56 F dilation, query occult cervical esophageal web. Hiatal hernia. Question gastroparesis.   [name] ESOPHAGOGASTRODUODENOSCOPY (EGD) WITH PROPOFOL N/A 05/14/2017   [name]: normal esophagus s/p empiric dilation, benign gastric polyps, normal duodenum  . MALONEY DILATION N/A 09/21/2014   Procedure: Elease Hashimoto DILATION;  Surgeon: [name], MD;  [facility];  Service: Endoscopy;  Laterality: N/A;  . Elease Hashimoto DILATION N/A 05/14/2017   Procedure: Elease Hashimoto DILATION;  Surgeon: [name], MD;  [facility];  Service: Endoscopy;  Laterality: N/A;  . None    . SAVORY DILATION N/A 09/21/2014   Procedure: SAVORY DILATION;  Surgeon: [name], MD;  [facility];  Service: Endoscopy;  Laterality: N/A;    SOCIAL HISTORY: Social History   Socioeconomic History  . Marital status: Single    Spouse name: Not on file  . Number of children: Not on file  . Years of education: Not on file  . Highest education level: Not on file  Social Needs  . Financial resource strain: Not on file  .  Food insecurity - worry: Not on file  . Food insecurity - inability: Not on file  . Transportation needs - medical: Not on file  . Transportation needs - non-medical: Not on file  Occupational History  . Not on file  Tobacco Use  . Smoking status: Current Every Day Smoker    Packs/day: 0.50    Years: 30.00    Pack years: 15.00    Types: Cigarettes  . Smokeless tobacco:  Never Used  . Tobacco comment: 4 cigarettes a day  Substance and Sexual Activity  . Alcohol use: No  . Drug use: No  . Sexual activity: No    Birth control/protection: None  Other Topics Concern  . Not on file  Social History Narrative  . Not on file    FAMILY HISTORY: Family History  Problem Relation Age of Onset  . Colon cancer Unknown        unknown    ALLERGIES:  has No Known Allergies.  MEDICATIONS:  Current Outpatient Medications  Medication Sig Dispense Refill  . amLODipine (NORVASC) 5 MG tablet Take 5 mg by mouth every morning.    [name] buPROPion (WELLBUTRIN SR) 150 MG 12 hr tablet Take 150 mg by mouth 2 (two) times daily.    . cyclobenzaprine (FLEXERIL) 10 MG tablet Take 10 mg by mouth at bedtime.    [name] DEXILANT 60 MG capsule TAKE 1 CAPSULE BY MOUTH ONCE A DAY. 30 capsule 11  . docusate sodium (COLACE) 100 MG capsule Take 100 mg by mouth 2 (two) times daily.    [name] ENSURE (ENSURE) USE 1 CAN BY MOUTH TWICE DAILY 2370 mL PRN  . fluticasone (FLONASE) 50 MCG/ACT nasal spray Place 2 sprays into both nostrils daily.    [name] gemfibrozil (LOPID) 600 MG tablet Take 600 mg by mouth 2 (two) times daily.    . mirtazapine (REMERON) 15 MG tablet Take 15 mg by mouth at bedtime.    [name] oxybutynin (DITROPAN) 5 MG tablet Take 5 mg by mouth every morning. Reported on 02/21/2016    . polyethylene glycol powder (GLYCOLAX/MIRALAX) powder Take 17 grams mixed in 8 ounces of water each evening. 255 g 3  . potassium chloride (MICRO-K) 10 MEQ CR capsule Take 10 mEq by mouth 2 (two) times daily.    . ranitidine (ZANTAC) 300 MG tablet Take 300 mg by mouth at bedtime.    . risperiDONE (RISPERDAL) 1 MG tablet Take 1 mg by mouth 3 (three) times daily.     . tamsulosin (FLOMAX) 0.4 MG CAPS capsule Take 0.4 mg by mouth every evening.     . triamterene-hydrochlorothiazide (MAXZIDE-25) 37.5-25 MG per tablet Take 1 tablet by mouth every morning.    . Vitamin D, Ergocalciferol, (DRISDOL) 50000 UNITS CAPS capsule Take  50,000 Units by mouth every 14 (fourteen) days.     No current facility-administered medications for this visit.     REVIEW OF SYSTEMS:    10 Point review of Systems was done is negative except as noted above.   PHYSICAL EXAMINATION: . Vitals:   11/20/17 1402  BP: 138/75  Pulse: 73  Resp: 18  Temp: 98 F (36.7 C)  SpO2: 99%   Filed Weights   11/20/17 1402  Weight: 145 lb 9.6 oz (66 kg)   .Body mass index is 24.23 kg/m.  Physical Exam  Constitutional: He is oriented to person, place, and time and well-developed, well-nourished, and in no distress. No distress.  HENT:  Head: Normocephalic and atraumatic.  Mouth/Throat:  No oropharyngeal exudate.  Eyes: Conjunctivae are normal. Pupils are equal, round, and reactive to light. No scleral icterus.  Neck: Normal range of motion. Neck supple. No JVD present.  Cardiovascular: Normal rate, regular rhythm and normal heart sounds. Exam reveals no gallop and no friction rub.  No murmur heard. Pulmonary/Chest: Breath sounds normal. No respiratory distress. He has no wheezes. He has no rales.  Abdominal: Soft. Bowel sounds are normal. He exhibits no distension. There is no tenderness. There is no guarding.  Musculoskeletal: He exhibits no edema or tenderness.  Lymphadenopathy:    He has no cervical adenopathy.  Neurological: He is alert and oriented to person, place, and time. No cranial nerve deficit.  Skin: Skin is warm and dry. No rash noted. No erythema. No pallor.  Psychiatric: Affect and judgment normal.     LABORATORY DATA:  I have reviewed the data as listed  . CBC Latest Ref Rng & Units 05/08/2017 10/30/2016 09/25/2016  WBC 4.0 - 10.5 K/uL 2.8(L) - 3.2(L)  Hemoglobin 13.0 - 17.0 g/dL 69.614.2 - 29.514.1  Hematocrit 39.0 - 52.0 % 40.7 44.3 41.3  Platelets 150 - 400 K/uL 142(L) - 140   . CBC    Component Value Date/Time   WBC 2.8 (L) 05/08/2017 1343   RBC 4.56 05/08/2017 1343   HGB 14.2 05/08/2017 1343   HCT 40.7  05/08/2017 1343   HCT 44.3 10/30/2016 1143   PLT 142 (L) 05/08/2017 1343   MCV 89.3 05/08/2017 1343   MCH 31.1 05/08/2017 1343   MCHC 34.9 05/08/2017 1343   RDW 12.4 05/08/2017 1343   LYMPHSABS 1.5 05/08/2017 1343   MONOABS 0.2 05/08/2017 1343   EOSABS 0.1 05/08/2017 1343   BASOSABS 0.0 05/08/2017 1343    . CMP Latest Ref Rng & Units 05/08/2017 10/30/2016 01/27/2016  Glucose 65 - 99 mg/dL 77 28(U64(L) 132(G132(H)  BUN 6 - 20 mg/dL 10 14 18   Creatinine 0.61 - 1.24 mg/dL 4.011.01 0.271.11 2.53(G1.26(H)  Sodium 135 - 145 mmol/L 137 137 135  Potassium 3.5 - 5.1 mmol/L 3.3(L) 3.8 3.9  Chloride 101 - 111 mmol/L 98(L) 98(L) 99(L)  CO2 22 - 32 mmol/L 32 33(H) 28  Calcium 8.9 - 10.3 mg/dL 9.3 9.6 6.4(Q8.4(L)  Total Protein 6.5 - 8.1 g/dL - 7.7 -  Total Bilirubin 0.3 - 1.2 mg/dL - 0.8 -  Alkaline Phos 38 - 126 U/L - 90 -  AST 15 - 41 U/L - 31 -  ALT 17 - 63 U/L - 26 -     RADIOGRAPHIC STUDIES: I have personally reviewed the radiological images as listed and agreed with the findings in the report. No results found.  ASSESSMENT & PLAN:   61 year old African-American male with mental retardation and schizophrenia with leukopenia/neutropenia.  Patient noted to have leukopenia since at least November 2016 and his ANC has dropped from about 1400 in 01/2016 down to 900 on 09/25/2016. Most recent ANC from 05/08/17 has been stable at 900. Patient was supposed to get labwork performed prior to his exam today. On exam today he has no lymphadenopathy or hepatosplenomegaly to suggest a lymphoproliferative process.  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 He was previously taken off of thorazine which was suspected to cause his neutropenia. However he is currently taking risperidal and mirtazepine which are both known to cause neutropenia as well. He may continue to take these medications since it is necessary for his psych issues.  Will continue monitoring his WBC.  Annual flu vaccine given today  per patient's  wishes. Follow up in 6 months. Repeat CBC on next visit.   Orders Placed This Encounter  Procedures  . CBC with Differential    Standing Status:   Standing    Number of Occurrences:   2    Standing Expiration Date:   11/20/2018   Ralene Cork, MD

## 2017-12-12 ENCOUNTER — Other Ambulatory Visit: Payer: Self-pay

## 2017-12-12 ENCOUNTER — Ambulatory Visit (INDEPENDENT_AMBULATORY_CARE_PROVIDER_SITE_OTHER): Payer: Medicare Other | Admitting: Family Medicine

## 2017-12-12 ENCOUNTER — Encounter: Payer: Self-pay | Admitting: Family Medicine

## 2017-12-12 VITALS — BP 160/86 | HR 96 | Temp 98.7°F | Resp 16 | Ht 65.0 in | Wt 148.0 lb

## 2017-12-12 DIAGNOSIS — E785 Hyperlipidemia, unspecified: Secondary | ICD-10-CM | POA: Diagnosis not present

## 2017-12-12 DIAGNOSIS — Z79899 Other long term (current) drug therapy: Secondary | ICD-10-CM | POA: Diagnosis not present

## 2017-12-12 DIAGNOSIS — R946 Abnormal results of thyroid function studies: Secondary | ICD-10-CM | POA: Diagnosis not present

## 2017-12-12 DIAGNOSIS — R634 Abnormal weight loss: Secondary | ICD-10-CM | POA: Diagnosis not present

## 2017-12-12 DIAGNOSIS — R739 Hyperglycemia, unspecified: Secondary | ICD-10-CM

## 2017-12-12 DIAGNOSIS — R7989 Other specified abnormal findings of blood chemistry: Secondary | ICD-10-CM

## 2017-12-12 DIAGNOSIS — F79 Unspecified intellectual disabilities: Secondary | ICD-10-CM

## 2017-12-12 DIAGNOSIS — E559 Vitamin D deficiency, unspecified: Secondary | ICD-10-CM | POA: Diagnosis not present

## 2017-12-12 DIAGNOSIS — E538 Deficiency of other specified B group vitamins: Secondary | ICD-10-CM

## 2017-12-12 DIAGNOSIS — I10 Essential (primary) hypertension: Secondary | ICD-10-CM | POA: Diagnosis not present

## 2017-12-12 DIAGNOSIS — R011 Cardiac murmur, unspecified: Secondary | ICD-10-CM

## 2017-12-12 MED ORDER — AMLODIPINE BESYLATE 10 MG PO TABS: 10.0000 mg | ORAL_TABLET | Freq: Every morning | ORAL | 0 refills | Status: DC

## 2017-12-12 NOTE — Progress Notes (Signed)
Patient ID: [facility], male    DOB: 1956-03-27, 61 y.o.   MRN: [id number]  Chief Complaint  Patient presents with  . Establish Care  . Hypertension    Allergies Patient has no known allergies.  Subjective:   Kyle [facility] is a 61 y.o. male who presents to [facility] today.  HPI [name] presents today as a new patient visit to establish care.  He has as been at [facility] for over four years. [facility] [place] located in [place], [place] off of [address].  Has previously been seen by [facility] in [place]. Is accompanied by [facility], staff at the group [facility].  He has a past medical history significant for cognitive deficits/mental retardation and schizophrenia.  Due to this medical history he is unable to answer most of my questions today.  He answers most questions and 1-3 word answers.  He reports he is happy at the [facility].  He denies any problems or pain today.  He does report that he is hungry at this time and ready to leave.  He reports he smokes cigarettes.  The aide reports that he is able to smoke 1 cigarette every four hours.  He is also followed [place] for schizophrenia.  He is followed by [place] [facility] at [facility] for GERD/constipation.  And he is followed by [facility] to get toenails clipped.  He has reportedly never been seen by cardiology.  He has a history of hypertension and is on medication.  Upon review of his medication administration record he did receive his blood pressure medications this morning.  There are no reported blood pressure readings brought to the visit today.  He eats a regular diet.  Is continent of urine and stool.  Enjoys watching TV.  Has reportedly been in a [facility] most of his life.  He denies any chest pain, shortness of breath, edema in his lower extremities.  I asked the aide if he ever complains of problems or pain at the [facility].  [facility] [place] reports that he is occasionally constipated and  will complain of that.  He does drink water and eat a good diet.  Reports his appetite is good.  Has been seen at the [facility] cancer center in the past for chronic neutropenia/leukopenia.  His chart was reviewed today and he has had a negative HIV and hepatitis test within the past 5 years.  He is not sexually active.  The caregiver reports that over the past several years he has lost weight.  He was seen by GI approximately 5 months ago and he is 5 pounds heavier now than he was at that visit.  He has no known documented history of diabetes but does have an elevated blood sugar reading in the chart.  There is no evidence of a hemoglobin A1c being drawn before.  We do not have copies of his records today.  He did receive a flu shot but we do not know if he is received other vaccinations.    Past Medical History:  Diagnosis Date  . Acute cholecystitis 01/25/2016  . Anxiety   . BPH (benign prostatic hyperplasia)   . Chronic vomiting   . GERD (gastroesophageal reflux disease)   . Hypercholesteremia   . Hypertension   . Mental retardation   . OSA (obstructive sleep apnea)    no cpap machine; could not tolerate.  . Schatzki's ring   .  Schizophrenia [facility])     Past Surgical History:  Procedure Laterality Date  . BIOPSY  05/14/2017   Procedure: BIOPSY;  Surgeon: [name], MD;  [facility];  Service: Endoscopy;;  gastric polyp bx  . CHOLECYSTECTOMY N/A 01/26/2016   Procedure: LAPAROSCOPIC CHOLECYSTECTOMY;  Surgeon: [name], MD;  Location: [facility] ORS;  Service: General;  Laterality: N/A;  . COLONOSCOPY N/A 09/21/2014   Dr. Jena [facility]: right-sided diverticulosis, internal hemorrhoids. Repeat in 10 years.   . COLONOSCOPY WITH PROPOFOL N/A 05/14/2017   Dr. Jena [facility]: diverticulosis in ascending colon, otherwise normal  . ESOPHAGOGASTRODUODENOSCOPY N/A 09/21/2014   Dr. Jena [facility]: Subtle non-critical Schatzki's ring s/p 56 F dilation, query occult cervical esophageal web. Hiatal hernia. Question  gastroparesis.   [name] ESOPHAGOGASTRODUODENOSCOPY (EGD) WITH PROPOFOL N/A 05/14/2017   Dr. Jena [facility]: normal esophagus s/p empiric dilation, benign gastric polyps, normal duodenum  . MALONEY DILATION N/A 09/21/2014   Procedure: Elease Hashimoto DILATION;  Surgeon: [name], MD;  [facility];  Service: Endoscopy;  Laterality: N/A;  . Elease Hashimoto DILATION N/A 05/14/2017   Procedure: Elease Hashimoto DILATION;  Surgeon: [name], MD;  [facility];  Service: Endoscopy;  Laterality: N/A;  . None    . SAVORY DILATION N/A 09/21/2014   Procedure: SAVORY DILATION;  Surgeon: [name], MD;  Location[facility];  Service: Endoscopy;  Laterality: N/A;    Family History  Problem Relation Age of Onset  . Colon cancer Unknown        unknown  . Hypertension Unknown   . Hyperlipidemia Unknown      Social History   Socioeconomic History  . Marital status: Single    Spouse name: None  . Number of children: None  . Years of education: None  . Highest education level: None  Social Needs  . Financial resource strain: None  . Food insecurity - worry: None  . Food insecurity - inability: None  . Transportation needs - medical: None  . Transportation needs - non-medical: None  Occupational History  . None  Tobacco Use  . Smoking status: Current Every Day Smoker    Packs/day: 0.25    Years: 30.00    Pack years: 7.50    Types: Cigarettes  . Smokeless tobacco: Never Used  . Tobacco comment: 4 cigarettes a day  Substance and Sexual Activity  . Alcohol use: No  . Drug use: No  . Sexual activity: No    Birth control/protection: None  Other Topics Concern  . None  Social History Narrative  . None    Review of Systems  Constitutional: Negative for activity change, chills, diaphoresis, fatigue and fever.  Respiratory: Negative for cough, chest tightness and stridor.   Cardiovascular: Negative for chest pain.  Gastrointestinal: Negative for nausea and vomiting.  Genitourinary:  Negative for urgency.  Musculoskeletal: Negative for myalgias.  Skin: Negative for rash.  Neurological: Negative for dizziness and tremors.  Review of system is performed in detail, but patient has trouble answering questions and I am not sure about the accuracy of his answers.   Objective:   BP (!) 160/86 (BP Location: Left Arm, Patient Position: Sitting, Cuff Size: Normal)   Pulse 96   Temp 98.7 F (37.1 C) (Other (Comment))   Resp 16   Ht 5\' 5"  (1.651 m)   Wt 148 lb (67.1 kg)   SpO2 97%   BMI 24.63 kg/m   Physical Exam  Constitutional: He is oriented to person, place, and  time. He appears well-developed and well-nourished.  Thin African-American male in no acute distress.  Alert.  HENT:  Head: Normocephalic and atraumatic.  Eyes: EOM are normal. Pupils are equal, round, and reactive to light.  Neck: Normal range of motion. Neck supple. No thyromegaly present.  Cardiovascular: Normal rate, regular rhythm and normal pulses.  Murmur heard.  Systolic murmur is present with a grade of 4/6. Pulses:      Dorsalis pedis pulses are 2+ on the right side, and 2+ on the left side.  Holosystolic murmur  Pulmonary/Chest: Effort normal and breath sounds normal.  Musculoskeletal: He exhibits no edema.  Neurological: He is alert and oriented to person, place, and time. No cranial nerve deficit.  Skin: Skin is warm, dry and intact.  Psychiatric: Cognition and memory are impaired.  Vitals reviewed. Psychiatric: Mood and affect not within normal limits.  Thought process is difficult to ascertain and evaluate secondary to the fact that patient is not very verbal.  He does laugh inappropriately at times during the visit.  There is no distracting behaviors  or distracting mannerisms present.  No movements consistent with extrapyramidal side effects noted.   Assessment and Plan   1. Newly recognized heart murmur Patient denies any symptoms.  Review of chart does not note prior history of  murmur in review of provider notes.  Will obtain echo and cardiology referral at this time. - Ambulatory referral to Cardiology - ECHOCARDIOGRAM COMPLETE; Future  2. Essential hypertension Will increase amlodipine to 10 mg a day.  Prescription sent into pharmacy and noted on order sheet for [facility].  Smoking cessation recommended but patient does not seem interested in quitting. - Lipid panel - Hemoglobin A1c Ordered low-salt diet for [facility]. 3. Low serum vitamin B12 Borderline B12 levels in the past and cognitive deficits.  Check levels and consider starting supplementation. - Vitamin B12  4. Vitamin D deficiency Patient on high levels of vitamin D for extended period of time.  Check levels to make sure he is not vitamin D toxic. - VITAMIN D 25 Hydroxy (Vit-D Deficiency, Fractures)  5. Borderline abnormal thyroid function test Review of tests from previous primary care physician revealed elevated T3/T4 but normal TSH.  In the setting of questionable weight loss we will plan on checking his TSH at this time. - TSH  6. High risk medication use Check labs. - Hemoglobin A1c - COMPLETE METABOLIC PANEL WITH GFR  7. Hyperlipidemia, unspecified hyperlipidemia type Suspect patient is on gemfibrozil secondary to elevated triglycerides as a result of antipsychotic use.  Will check FLP at this time and see if this medication is indicated or needed.  Suspect with his history of hypertension and tobacco abuse that statin therapy may be better indicated. - Lipid panel  8. Weight loss Patient has been followed [facility] and also by GI.  Keep scheduled visits and will get labs at this time.  HIV and hepatitis reviewed in chart which were negative.  Weight seems to have stabilized at this time.  9. Hyperglycemia [place] for diabetes at this time secondary to other cardiac risk factors and due to the fact the patient is on antipsychotics. - Hemoglobin A1c  10.   BPH [facility] requesting referral to urology.  I will place the referral to the urologist that patient usually sees.  I asked Kyle HeckRandy to please call back and let us know which urology office patient has been followed because it is not in the  chart.  Records requested from PCP.  Immunization records requested.  It was asked for patient to follow-up in 1 month for recheck of blood pressure.  Depending on echo results other blood pressure medication may have a more compelling indication rather than Norvasc. 11.  Cognitive deficit/schizophrenia/mental retardation Continue current living situation and [facility].  Keep scheduled follow-ups with psychiatry for management of all patient's psychiatric medications. Return in about 4 weeks (around 01/09/2018) for Follow-up. Aliene Beams, MD 12/12/2017

## 2017-12-13 LAB — VITAMIN B12: Vitamin B-12: 594 pg/mL (ref 200–1100)

## 2017-12-13 LAB — COMPLETE METABOLIC PANEL WITH GFR
AG RATIO: 1.6 (calc) (ref 1.0–2.5)
ALKALINE PHOSPHATASE (APISO): 93 U/L (ref 40–115)
ALT: 21 U/L (ref 9–46)
AST: 29 U/L (ref 10–35)
Albumin: 4.7 g/dL (ref 3.6–5.1)
BUN: 15 mg/dL (ref 7–25)
CO2: 30 mmol/L (ref 20–32)
Calcium: 9.8 mg/dL (ref 8.6–10.3)
Chloride: 100 mmol/L (ref 98–110)
Creat: 1.02 mg/dL (ref 0.70–1.25)
GFR, EST NON AFRICAN AMERICAN: 79 mL/min/{1.73_m2} (ref >=60)
GFR, Est African American: 92 mL/min/{1.73_m2} (ref >=60)
GLOBULIN: 2.9 g/dL (ref 1.9–3.7)
Glucose, Bld: 71 mg/dL (ref 65–139)
POTASSIUM: 3.8 mmol/L (ref 3.5–5.3)
SODIUM: 139 mmol/L (ref 135–146)
Total Bilirubin: 0.5 mg/dL (ref 0.2–1.2)
Total Protein: 7.6 g/dL (ref 6.1–8.1)

## 2017-12-13 LAB — HEMOGLOBIN A1C
HEMOGLOBIN A1C: 5.5 %{Hb} (ref <5.7)
MEAN PLASMA GLUCOSE: 111 (calc)
eAG (mmol/L): 6.2 (calc)

## 2017-12-13 LAB — LIPID PANEL
CHOL/HDL RATIO: 1.6 (calc) (ref <5.0)
Cholesterol: 158 mg/dL (ref <200)
HDL: 96 mg/dL (ref >40)
LDL CHOLESTEROL (CALC): 53 mg/dL
Non-HDL Cholesterol (Calc): 62 mg/dL (calc) (ref <130)
Triglycerides: 33 mg/dL (ref <150)

## 2017-12-13 LAB — TSH: TSH: 0.98 m[IU]/L (ref 0.40–4.50)

## 2017-12-13 LAB — VITAMIN D 25 HYDROXY (VIT D DEFICIENCY, FRACTURES): Vit D, 25-Hydroxy: 43 ng/mL (ref 30–100)

## 2017-12-17 DIAGNOSIS — I739 Peripheral vascular disease, unspecified: Secondary | ICD-10-CM | POA: Diagnosis not present

## 2017-12-17 DIAGNOSIS — B351 Tinea unguium: Secondary | ICD-10-CM | POA: Diagnosis not present

## 2017-12-17 DIAGNOSIS — L110 Acquired keratosis follicularis: Secondary | ICD-10-CM | POA: Diagnosis not present

## 2017-12-19 ENCOUNTER — Ambulatory Visit (HOSPITAL_COMMUNITY)
Admission: RE | Admit: 2017-12-19 | Discharge: 2017-12-19 | Disposition: A | Discharge status: Home / Self Care | Payer: Medicare Other | Source: Ambulatory Visit | Attending: Family Medicine | Admitting: Family Medicine

## 2017-12-19 DIAGNOSIS — I10 Essential (primary) hypertension: Secondary | ICD-10-CM | POA: Diagnosis not present

## 2017-12-19 DIAGNOSIS — I340 Nonrheumatic mitral (valve) insufficiency: Secondary | ICD-10-CM | POA: Insufficient documentation

## 2017-12-19 DIAGNOSIS — K219 Gastro-esophageal reflux disease without esophagitis: Secondary | ICD-10-CM | POA: Insufficient documentation

## 2017-12-19 DIAGNOSIS — F79 Unspecified intellectual disabilities: Secondary | ICD-10-CM | POA: Diagnosis not present

## 2017-12-19 DIAGNOSIS — F209 Schizophrenia, unspecified: Secondary | ICD-10-CM | POA: Insufficient documentation

## 2017-12-19 DIAGNOSIS — R011 Cardiac murmur, unspecified: Secondary | ICD-10-CM | POA: Diagnosis not present

## 2017-12-19 NOTE — Progress Notes (Signed)
*  PRELIMINARY RESULTS* Echocardiogram 2D Echocardiogram has been performed.  [place] 12/19/2017, 11:23 AM

## 2017-12-22 ENCOUNTER — Other Ambulatory Visit: Payer: Self-pay | Admitting: Gastroenterology

## 2017-12-23 ENCOUNTER — Telehealth: Payer: Self-pay | Admitting: Family Medicine

## 2017-12-23 DIAGNOSIS — I340 Nonrheumatic mitral (valve) insufficiency: Secondary | ICD-10-CM

## 2017-12-23 NOTE — Telephone Encounter (Signed)
Spoke to [place] at group home. Informed of message below.

## 2017-12-23 NOTE — Telephone Encounter (Signed)
Please call group home/caseworker and advised that patient's echo revealed moderate to severe mitral regurgitation.  It is recommended at this time that he get a TEE to further evaluate mitral regurgitation and MVanatomy.  I will place the orders for this and a referral for him to see cardiology.

## 2017-12-24 ENCOUNTER — Other Ambulatory Visit: Payer: Self-pay | Admitting: Family Medicine

## 2017-12-24 ENCOUNTER — Other Ambulatory Visit: Payer: Self-pay | Admitting: Gastroenterology

## 2017-12-24 NOTE — Telephone Encounter (Signed)
I refused Remeron yesterday because I could not find where we prescribed it. Can you investigate? Maybe pharmacy sent it to [place] by mistake?

## 2017-12-24 NOTE — Telephone Encounter (Signed)
I called [facility] and it must have been sent in error. They were trying to send it to pts pcp.

## 2017-12-25 NOTE — Telephone Encounter (Signed)
Thanks

## 2018-01-06 ENCOUNTER — Encounter: Payer: Self-pay | Admitting: Gastroenterology

## 2018-01-06 ENCOUNTER — Ambulatory Visit (INDEPENDENT_AMBULATORY_CARE_PROVIDER_SITE_OTHER): Payer: Medicare Other | Admitting: Gastroenterology

## 2018-01-06 VITALS — BP 146/89 | HR 91 | Temp 98.1°F | Ht 65.0 in | Wt 143.2 lb

## 2018-01-06 DIAGNOSIS — R634 Abnormal weight loss: Secondary | ICD-10-CM | POA: Diagnosis not present

## 2018-01-06 DIAGNOSIS — K219 Gastro-esophageal reflux disease without esophagitis: Secondary | ICD-10-CM

## 2018-01-06 DIAGNOSIS — K5900 Constipation, unspecified: Secondary | ICD-10-CM | POA: Diagnosis not present

## 2018-01-06 NOTE — Assessment & Plan Note (Signed)
Stable from last visit in 2018. Weight fluctuates but overall in same range since April 2018. Continue ensure. Thorough evaluation already completed as noted in HPI. Return in 6 months.

## 2018-01-06 NOTE — Patient Instructions (Signed)
I am glad you are doing well!  Continue Dexilant once each day for reflux. Continue Miralax each evening for constipation.  We will see you in 6 months!

## 2018-01-06 NOTE — Assessment & Plan Note (Signed)
Continue Miralax once daily. 6 month return.

## 2018-01-06 NOTE — Progress Notes (Signed)
cc'd to pcp 

## 2018-01-06 NOTE — Assessment & Plan Note (Signed)
Continue Dexilant once daily. Return in 6 months.

## 2018-01-06 NOTE — Progress Notes (Signed)
Primary Care Physician:  Aliene Beams, MD Primary GI: [name]   Chief Complaint  Patient presents with  . Weight Loss    HPI:   [facility] is a 62 y.o. male presenting today with a history of weight loss, dyspepsia, and an extensive evaluation previously. Due to his cognitive status (mentally challenged), it has been historicallydifficult to obtain a complete history. Ultimately, a HIDA scan was performed 01/25/16 that showed acute cholecystitis. CTA had also been ordered, which was negative for mesenteric ischemia. He ultimately had a cholecystectomy 01/26/16 by Dr. Lovell Sheehan. Due to persistent leukopenia, I referred him to Hematology. It was felt this could be related to Thioridazine and recommended close monitoring due to potential for significant agranulocytosis. Colonoscopy/EGD up-to-date. Weight is stable from Sept 2018 at last visit. I note that his weight has been fluctuating but overall within the same range since April 2018. He has established care with Dr. Tracie Harrier and found to have systolic murmur.   No abdominal pain, N/V, overt bleeding. Miralax each evening. Dexilant each morning. Limited historian. No concerns today. Enjoys the supplemental ensure. Toni Arthurs is his favorite.   Past Medical History:  Diagnosis Date  . Acute cholecystitis 01/25/2016  . Anxiety   . BPH (benign prostatic hyperplasia)   . Chronic vomiting   . GERD (gastroesophageal reflux disease)   . Hypercholesteremia   . Hypertension   . Mental retardation   . OSA (obstructive sleep apnea)    no cpap machine; could not tolerate.  . Schatzki's ring   . Schizophrenia [facility])     Past Surgical History:  Procedure Laterality Date  . BIOPSY  05/14/2017   Procedure: BIOPSY;  Surgeon: [name], MD;  [facility];  Service: Endoscopy;;  gastric polyp bx  . CHOLECYSTECTOMY N/A 01/26/2016   Procedure: LAPAROSCOPIC CHOLECYSTECTOMY;  Surgeon: [name], MD;  Location: [facility];  Service:  General;  Laterality: N/A;  . COLONOSCOPY N/A 09/21/2014   [name]: right-sided diverticulosis, internal hemorrhoids. Repeat in 10 years.   . COLONOSCOPY WITH PROPOFOL N/A 05/14/2017   [name]: diverticulosis in ascending colon, otherwise normal  . ESOPHAGOGASTRODUODENOSCOPY N/A 09/21/2014   [name]: Subtle non-critical Schatzki's ring s/p 56 F dilation, query occult cervical esophageal web. Hiatal hernia. Question gastroparesis.   [name] ESOPHAGOGASTRODUODENOSCOPY (EGD) WITH PROPOFOL N/A 05/14/2017   [name]: normal esophagus s/p empiric dilation, benign gastric polyps, normal duodenum  . MALONEY DILATION N/A 09/21/2014   Procedure: Elease Hashimoto DILATION;  Surgeon: [name], MD;  [facility];  Service: Endoscopy;  Laterality: N/A;  . Elease Hashimoto DILATION N/A 05/14/2017   Procedure: Elease Hashimoto DILATION;  Surgeon: [name], MD;  [facility];  Service: Endoscopy;  Laterality: N/A;  . None    . SAVORY DILATION N/A 09/21/2014   Procedure: SAVORY DILATION;  Surgeon: [name], MD;  [facility];  Service: Endoscopy;  Laterality: N/A;    Current Outpatient Medications  Medication Sig Dispense Refill  . buPROPion (WELLBUTRIN SR) 150 MG 12 hr tablet Take 150 mg by mouth 2 (two) times daily.    . cyclobenzaprine (FLEXERIL) 10 MG tablet Take 10 mg by mouth at bedtime.    [name] DEXILANT 60 MG capsule TAKE 1 CAPSULE BY MOUTH ONCE A DAY. 30 capsule 11  . docusate sodium (COLACE) 100 MG capsule TAKE (1) CAPSULE BY MOUTH TWICE DAILY FOR CONSTIPATION. 60 capsule 11  . ENSURE (ENSURE) USE 1 CAN BY MOUTH  TWICE DAILY 2370 mL PRN  . fluticasone (FLONASE) 50 MCG/ACT nasal spray Place 2 sprays into both nostrils daily.    [name] gemfibrozil (LOPID) 600 MG tablet Take 600 mg by mouth 2 (two) times daily.    . mirtazapine (REMERON) 15 MG tablet TAKE 1 TABLET BY MOUTH DAILY 1 HOUR BEFORE BEDTIME. 30 tablet 11  . oxybutynin (DITROPAN) 5 MG tablet Take 5 mg by mouth every morning.  Reported on 02/21/2016    . polyethylene glycol powder (GLYCOLAX/MIRALAX) powder Take 17 grams mixed in 8 ounces of water each evening. 255 g 3  . potassium chloride (MICRO-K) 10 MEQ CR capsule Take 10 mEq by mouth 2 (two) times daily.    . ranitidine (ZANTAC) 300 MG tablet Take 300 mg by mouth at bedtime.    . risperiDONE (RISPERDAL) 1 MG tablet Take 1 mg by mouth 3 (three) times daily.     . tamsulosin (FLOMAX) 0.4 MG CAPS capsule Take 0.4 mg by mouth every evening.     . triamterene-hydrochlorothiazide (MAXZIDE-25) 37.5-25 MG per tablet Take 1 tablet by mouth every morning.    . Vitamin D, Ergocalciferol, (DRISDOL) 50000 UNITS CAPS capsule Take 50,000 Units by mouth every 14 (fourteen) days.    [name] amLODipine (NORVASC) 10 MG tablet Take 1 tablet (10 mg total) by mouth every morning. (Patient not taking: Reported on 01/06/2018) 90 tablet 0   No current facility-administered medications for this visit.     Allergies as of 01/06/2018  . (No Known Allergies)    Family History  Problem Relation Age of Onset  . Colon cancer Unknown        unknown  . Hypertension Unknown   . Hyperlipidemia Unknown     Social History   Socioeconomic History  . Marital status: Single    Spouse name: None  . Number of children: None  . Years of education: None  . Highest education level: None  Social Needs  . Financial resource strain: None  . Food insecurity - worry: None  . Food insecurity - inability: None  . Transportation needs - medical: None  . Transportation needs - non-medical: None  Occupational History  . None  Tobacco Use  . Smoking status: Current Every Day Smoker    Packs/day: 0.25    Years: 30.00    Pack years: 7.50    Types: Cigarettes  . Smokeless tobacco: Never Used  . Tobacco comment: 4 cigarettes a day  Substance and Sexual Activity  . Alcohol use: No  . Drug use: No  . Sexual activity: No    Birth control/protection: None  Other Topics Concern  . None  Social History  Narrative  . None    Review of Systems: Limited due to cognitive deficits.   Physical Exam: BP (!) 146/89   Pulse 91   Temp 98.1 F (36.7 C) (Oral)   Ht 5\' 5"  (1.651 m)   Wt 143 lb 3.2 oz (65 kg)   BMI 23.83 kg/m  General:   Alert and oriented. No distress noted. Pleasant and cooperative.  Head:  Normocephalic and atraumatic. Eyes:  Conjuctiva clear without scleral icterus. Mouth:  Oral mucosa pink and moist.  Abdomen:  +BS, soft, non-tender and non-distended. No rebound or guarding. No HSM or masses noted. Msk:  Symmetrical without gross deformities. Normal posture. Extremities:  Without edema. Neurologic:  Alert and  oriented x4 Psych:  Alert and cooperative. Normal mood and affect.

## 2018-01-07 ENCOUNTER — Other Ambulatory Visit: Payer: Self-pay | Admitting: Gastroenterology

## 2018-01-12 ENCOUNTER — Ambulatory Visit: Payer: Medicare Other | Admitting: Family Medicine

## 2018-01-20 ENCOUNTER — Encounter: Payer: Self-pay | Admitting: Cardiology

## 2018-01-20 ENCOUNTER — Ambulatory Visit (INDEPENDENT_AMBULATORY_CARE_PROVIDER_SITE_OTHER): Payer: Medicare Other | Admitting: Cardiology

## 2018-01-20 VITALS — BP 120/76 | HR 95 | Ht 66.0 in | Wt 148.0 lb

## 2018-01-20 DIAGNOSIS — I340 Nonrheumatic mitral (valve) insufficiency: Secondary | ICD-10-CM | POA: Diagnosis not present

## 2018-01-20 NOTE — Progress Notes (Addendum)
Clinical Summary Kyle Wade is a 62 y.o.male seen as new patient, referred by Dr Tracie Harrier for heart murmur.   1. MItral regurgitation/heart murmur - newly diagnosed heart murmur detected by pcp - echo Jan 2019 LVEF 60-65%, no WMAs, normal diastolic function, moderate to severe MR. LVIDs 19 - limited history available from patient due to limited mental ability - aid mentions SOB with walking just room to room at group home which is new for him.  - generalized fatigue at home.    2. Cognitive deficit/Mental handicap - has legal guardian  3. Schizophrenia - followed by pcp  SH: Kyle Wade with [place] [facility] is his guardian.   Past Medical History:  Diagnosis Date  . Acute cholecystitis 01/25/2016  . Anxiety   . BPH (benign prostatic hyperplasia)   . Chronic vomiting   . GERD (gastroesophageal reflux disease)   . Hypercholesteremia   . Hypertension   . Mental retardation   . OSA (obstructive sleep apnea)    no cpap machine; could not tolerate.  . Schatzki's ring   . Schizophrenia (HCC)      No Known Allergies   Current Outpatient Medications  Medication Sig Dispense Refill  . buPROPion (WELLBUTRIN SR) 150 MG 12 hr tablet Take 150 mg by mouth 2 (two) times daily.    . cyclobenzaprine (FLEXERIL) 10 MG tablet Take 10 mg by mouth at bedtime.    [name] DEXILANT 60 MG capsule TAKE 1 CAPSULE BY MOUTH ONCE A DAY. 30 capsule 11  . docusate sodium (COLACE) 100 MG capsule TAKE (1) CAPSULE BY MOUTH TWICE DAILY FOR CONSTIPATION. 60 capsule 11  . ENSURE (ENSURE) USE 1 CAN BY MOUTH TWICE DAILY 2370 mL PRN  . fluticasone (FLONASE) 50 MCG/ACT nasal spray Place 2 sprays into both nostrils daily.    [name] gemfibrozil (LOPID) 600 MG tablet Take 600 mg by mouth 2 (two) times daily.    . mirtazapine (REMERON) 15 MG tablet TAKE 1 TABLET BY MOUTH DAILY 1 HOUR BEFORE BEDTIME. 30 tablet 11  . oxybutynin (DITROPAN) 5 MG tablet Take 5 mg by mouth every morning. Reported on 02/21/2016    . polyethylene  glycol powder (GLYCOLAX/MIRALAX) powder MIX 17 GRAMS IN 8 OUNCES. OF WATER AND DRINK ONCE DAILY IN THE EVENING. 527 g 11  . potassium chloride (MICRO-K) 10 MEQ CR capsule Take 10 mEq by mouth 2 (two) times daily.    . ranitidine (ZANTAC) 300 MG tablet Take 300 mg by mouth at bedtime.    . risperiDONE (RISPERDAL) 1 MG tablet Take 1 mg by mouth 3 (three) times daily.     . tamsulosin (FLOMAX) 0.4 MG CAPS capsule Take 0.4 mg by mouth every evening.     . triamterene-hydrochlorothiazide (MAXZIDE-25) 37.5-25 MG per tablet Take 1 tablet by mouth every morning.    . Vitamin D, Ergocalciferol, (DRISDOL) 50000 UNITS CAPS capsule Take 50,000 Units by mouth every 14 (fourteen) days.     No current facility-administered medications for this visit.      Past Surgical History:  Procedure Laterality Date  . BIOPSY  05/14/2017   Procedure: BIOPSY;  Surgeon: [name], MD;  [facility];  Service: Endoscopy;;  gastric polyp bx  . CHOLECYSTECTOMY N/A 01/26/2016   Procedure: LAPAROSCOPIC CHOLECYSTECTOMY;  Surgeon: [name], MD;  Location: [facility];  Service: General;  Laterality: N/A;  . COLONOSCOPY N/A 09/21/2014   [name]: right-sided diverticulosis, internal hemorrhoids. Repeat in 10 years.   . COLONOSCOPY  WITH PROPOFOL N/A 05/14/2017   [name]: diverticulosis in ascending colon, otherwise normal  . ESOPHAGOGASTRODUODENOSCOPY N/A 09/21/2014   [name]: Subtle non-critical Schatzki's ring s/p 56 F dilation, query occult cervical esophageal web. Hiatal hernia. Question gastroparesis.   [name] ESOPHAGOGASTRODUODENOSCOPY (EGD) WITH PROPOFOL N/A 05/14/2017   [name]: normal esophagus s/p empiric dilation, benign gastric polyps, normal duodenum  . MALONEY DILATION N/A 09/21/2014   Procedure: Elease Hashimoto DILATION;  Surgeon: [name], MD;  [facility];  Service: Endoscopy;  Laterality: N/A;  . Elease Hashimoto DILATION N/A 05/14/2017   Procedure: Elease Hashimoto DILATION;  Surgeon: [name],  MD;  [facility];  Service: Endoscopy;  Laterality: N/A;  . None    . SAVORY DILATION N/A 09/21/2014   Procedure: SAVORY DILATION;  Surgeon: [name], MD;  [facility];  Service: Endoscopy;  Laterality: N/A;     No Known Allergies    Family History  Problem Relation Age of Onset  . Colon cancer Unknown        unknown  . Hypertension Unknown   . Hyperlipidemia Unknown      Social History Mr. Asencio reports that he has been smoking cigarettes.  He has a 7.50 pack-year smoking history. he has never used smokeless tobacco. Mr. Hausmann reports that he does not drink alcohol.   Review of Systems CONSTITUTIONAL: No weight loss, fever, chills, weakness or fatigue.  HEENT: Eyes: No visual loss, blurred vision, double vision or yellow sclerae.No hearing loss, sneezing, congestion, runny nose or sore throat.  SKIN: No rash or itching.  CARDIOVASCULAR: per hpi RESPIRATORY: per hpi GASTROINTESTINAL: No anorexia, nausea, vomiting or diarrhea. No abdominal pain or blood.  GENITOURINARY: No burning on urination, no polyuria NEUROLOGICAL: No headache, dizziness, syncope, paralysis, ataxia, numbness or tingling in the extremities. No change in bowel or bladder control.  MUSCULOSKELETAL: No muscle, back pain, joint pain or stiffness.  LYMPHATICS: No enlarged nodes. No history of splenectomy.  PSYCHIATRIC: No history of depression or anxiety.  ENDOCRINOLOGIC: No reports of sweating, cold or heat intolerance. No polyuria or polydipsia.  [name]   Physical Examination Vitals:   01/20/18 0929 01/20/18 0932  BP: 124/78 120/76  Pulse: 95   SpO2: 96%    Vitals:   01/20/18 0929  Weight: 148 lb (67.1 kg)  Height: 5\' 6"  (1.676 m)    Gen: resting comfortably, no acute distress HEENT: no scleral icterus, pupils equal round and reactive, no palptable cervical adenopathy,  CV: RRR, 3/6 systolic murmur at apex, no jvd Resp: Clear to auscultation bilaterally GI: abdomen is  soft, non-tender, non-distended, normal bowel sounds, no hepatosplenomegaly MSK: extremities are warm, no edema.  Skin: warm, no rash Neuro:  no focal deficits Psych: appropriate affect   Assessment and Plan  1. Mitral regurgitation - TTE suggests significant MR. We will plan for TEE to better evaluate severity and anatomy - difficult to gather symptoms due to patients limited mental ability, from his caregivers reports he has shown evidence of SOB that may be valve related. Does not appear volume overloaded by exam - plan for TEE, will need consent from his legal guardian   Antoine Poche, M.D.  01/26/18 addendum After further review will plan to peform TEE with propofol. Patient with mental handicap and schizophrenia, concerns for ability to cooperate with moderate sedation alone. He did well during EGD/colonoscopy last year on propofol. He has a legal guardian [name] at [place] [facility] who verbally consented by  phone today and will plan to be at the procedure to provide any neccesary paperwork.  Dina Rich MD

## 2018-01-20 NOTE — Patient Instructions (Signed)
Medication Instructions:  Your physician recommends that you continue on your current medications as directed. Please refer to the Current Medication list given to you today.   Labwork: NONE   Testing/Procedures: Your physician has requested that you have a TEE. During a TEE, sound waves are used to create images of your heart. It provides your doctor with information about the size and shape of your heart and how well your heart's chambers and valves are working. In this test, a transducer is attached to the end of a flexible tube that's guided down your throat and into your esophagus (the tube leading from you mouth to your stomach) to get a more detailed image of your heart. You are not awake for the procedure. Please see the instruction sheet given to you today. For further information please visit [email or web address]     Follow-Up: Your physician recommends that you schedule a follow-up appointment pending test results.    Any Other Special Instructions Will Be Listed Below (If Applicable).     If you need a refill on your cardiac medications before your next appointment, please call your pharmacy. Thank you for choosing [facility]!

## 2018-01-20 NOTE — H&P (View-Only) (Signed)
Clinical Summary Kyle Wade is a 62 y.o.male seen as new patient, referred by Dr Tracie Harrier for heart murmur.   1. MItral regurgitation/heart murmur - newly diagnosed heart murmur detected by pcp - echo Jan 2019 LVEF 60-65%, no WMAs, normal diastolic function, moderate to severe MR. LVIDs 19 - limited history available from patient due to limited mental ability - aid mentions SOB with walking just room to room at group home which is new for him.  - generalized fatigue at home.    2. Cognitive deficit/Mental handicap - has legal guardian  3. Schizophrenia - followed by pcp  SH: Kyle Wade with [place] [facility] is his guardian.   Past Medical History:  Diagnosis Date  . Acute cholecystitis 01/25/2016  . Anxiety   . BPH (benign prostatic hyperplasia)   . Chronic vomiting   . GERD (gastroesophageal reflux disease)   . Hypercholesteremia   . Hypertension   . Mental retardation   . OSA (obstructive sleep apnea)    no cpap machine; could not tolerate.  . Schatzki's ring   . Schizophrenia (HCC)      No Known Allergies   Current Outpatient Medications  Medication Sig Dispense Refill  . buPROPion (WELLBUTRIN SR) 150 MG 12 hr tablet Take 150 mg by mouth 2 (two) times daily.    . cyclobenzaprine (FLEXERIL) 10 MG tablet Take 10 mg by mouth at bedtime.    [name] DEXILANT 60 MG capsule TAKE 1 CAPSULE BY MOUTH ONCE A DAY. 30 capsule 11  . docusate sodium (COLACE) 100 MG capsule TAKE (1) CAPSULE BY MOUTH TWICE DAILY FOR CONSTIPATION. 60 capsule 11  . ENSURE (ENSURE) USE 1 CAN BY MOUTH TWICE DAILY 2370 mL PRN  . fluticasone (FLONASE) 50 MCG/ACT nasal spray Place 2 sprays into both nostrils daily.    [name] gemfibrozil (LOPID) 600 MG tablet Take 600 mg by mouth 2 (two) times daily.    . mirtazapine (REMERON) 15 MG tablet TAKE 1 TABLET BY MOUTH DAILY 1 HOUR BEFORE BEDTIME. 30 tablet 11  . oxybutynin (DITROPAN) 5 MG tablet Take 5 mg by mouth every morning. Reported on 02/21/2016    . polyethylene  glycol powder (GLYCOLAX/MIRALAX) powder MIX 17 GRAMS IN 8 OUNCES. OF WATER AND DRINK ONCE DAILY IN THE EVENING. 527 g 11  . potassium chloride (MICRO-K) 10 MEQ CR capsule Take 10 mEq by mouth 2 (two) times daily.    . ranitidine (ZANTAC) 300 MG tablet Take 300 mg by mouth at bedtime.    . risperiDONE (RISPERDAL) 1 MG tablet Take 1 mg by mouth 3 (three) times daily.     . tamsulosin (FLOMAX) 0.4 MG CAPS capsule Take 0.4 mg by mouth every evening.     . triamterene-hydrochlorothiazide (MAXZIDE-25) 37.5-25 MG per tablet Take 1 tablet by mouth every morning.    . Vitamin D, Ergocalciferol, (DRISDOL) 50000 UNITS CAPS capsule Take 50,000 Units by mouth every 14 (fourteen) days.     No current facility-administered medications for this visit.      Past Surgical History:  Procedure Laterality Date  . BIOPSY  05/14/2017   Procedure: BIOPSY;  Surgeon: [name], MD;  [facility];  Service: Endoscopy;;  gastric polyp bx  . CHOLECYSTECTOMY N/A 01/26/2016   Procedure: LAPAROSCOPIC CHOLECYSTECTOMY;  Surgeon: [name], MD;  Location: [facility];  Service: General;  Laterality: N/A;  . COLONOSCOPY N/A 09/21/2014   [name]: right-sided diverticulosis, internal hemorrhoids. Repeat in 10 years.   . COLONOSCOPY  WITH PROPOFOL N/A 05/14/2017   [name]: diverticulosis in ascending colon, otherwise normal  . ESOPHAGOGASTRODUODENOSCOPY N/A 09/21/2014   [name]: Subtle non-critical Schatzki's ring s/p 56 F dilation, query occult cervical esophageal web. Hiatal hernia. Question gastroparesis.   [name] ESOPHAGOGASTRODUODENOSCOPY (EGD) WITH PROPOFOL N/A 05/14/2017   [name]: normal esophagus s/p empiric dilation, benign gastric polyps, normal duodenum  . MALONEY DILATION N/A 09/21/2014   Procedure: Elease Hashimoto DILATION;  Surgeon: [name], MD;  [facility];  Service: Endoscopy;  Laterality: N/A;  . Elease Hashimoto DILATION N/A 05/14/2017   Procedure: Elease Hashimoto DILATION;  Surgeon: [name],  MD;  [facility];  Service: Endoscopy;  Laterality: N/A;  . None    . SAVORY DILATION N/A 09/21/2014   Procedure: SAVORY DILATION;  Surgeon: [name], MD;  [facility];  Service: Endoscopy;  Laterality: N/A;     No Known Allergies    Family History  Problem Relation Age of Onset  . Colon cancer Unknown        unknown  . Hypertension Unknown   . Hyperlipidemia Unknown      Social History Mr. Asencio reports that he has been smoking cigarettes.  He has a 7.50 pack-year smoking history. he has never used smokeless tobacco. Mr. Hausmann reports that he does not drink alcohol.   Review of Systems CONSTITUTIONAL: No weight loss, fever, chills, weakness or fatigue.  HEENT: Eyes: No visual loss, blurred vision, double vision or yellow sclerae.No hearing loss, sneezing, congestion, runny nose or sore throat.  SKIN: No rash or itching.  CARDIOVASCULAR: per hpi RESPIRATORY: per hpi GASTROINTESTINAL: No anorexia, nausea, vomiting or diarrhea. No abdominal pain or blood.  GENITOURINARY: No burning on urination, no polyuria NEUROLOGICAL: No headache, dizziness, syncope, paralysis, ataxia, numbness or tingling in the extremities. No change in bowel or bladder control.  MUSCULOSKELETAL: No muscle, back pain, joint pain or stiffness.  LYMPHATICS: No enlarged nodes. No history of splenectomy.  PSYCHIATRIC: No history of depression or anxiety.  ENDOCRINOLOGIC: No reports of sweating, cold or heat intolerance. No polyuria or polydipsia.  [name]   Physical Examination Vitals:   01/20/18 0929 01/20/18 0932  BP: 124/78 120/76  Pulse: 95   SpO2: 96%    Vitals:   01/20/18 0929  Weight: 148 lb (67.1 kg)  Height: 5\' 6"  (1.676 m)    Gen: resting comfortably, no acute distress HEENT: no scleral icterus, pupils equal round and reactive, no palptable cervical adenopathy,  CV: RRR, 3/6 systolic murmur at apex, no jvd Resp: Clear to auscultation bilaterally GI: abdomen is  soft, non-tender, non-distended, normal bowel sounds, no hepatosplenomegaly MSK: extremities are warm, no edema.  Skin: warm, no rash Neuro:  no focal deficits Psych: appropriate affect   Assessment and Plan  1. Mitral regurgitation - TTE suggests significant MR. We will plan for TEE to better evaluate severity and anatomy - difficult to gather symptoms due to patients limited mental ability, from his caregivers reports he has shown evidence of SOB that may be valve related. Does not appear volume overloaded by exam - plan for TEE, will need consent from his legal guardian   Antoine Poche, M.D.  01/26/18 addendum After further review will plan to peform TEE with propofol. Patient with mental handicap and schizophrenia, concerns for ability to cooperate with moderate sedation alone. He did well during EGD/colonoscopy last year on propofol. He has a legal guardian [name] at [place] [facility] who verbally consented by  phone today and will plan to be at the procedure to provide any neccesary paperwork.  Dina Rich MD

## 2018-01-23 ENCOUNTER — Encounter: Payer: Self-pay | Admitting: Cardiology

## 2018-01-26 ENCOUNTER — Other Ambulatory Visit: Payer: Self-pay | Admitting: Cardiology

## 2018-01-26 ENCOUNTER — Other Ambulatory Visit: Payer: Self-pay | Admitting: Family Medicine

## 2018-01-26 DIAGNOSIS — I340 Nonrheumatic mitral (valve) insufficiency: Secondary | ICD-10-CM

## 2018-01-26 NOTE — OR Nursing (Signed)
Dr. Wyline Mood notified of patient having Propofol with his Colonoscopy and Upper Endoscopy last May. Dr. Wyline Mood said to have TEE scheduled with Propofol. Raechel Ache at [facility] notified of the above. Also notified Carloyn Jaeger of the above.

## 2018-01-27 ENCOUNTER — Other Ambulatory Visit (HOSPITAL_COMMUNITY): Payer: Medicare Other

## 2018-01-29 ENCOUNTER — Encounter (HOSPITAL_COMMUNITY)
Admission: RE | Admit: 2018-01-29 | Discharge: 2018-01-29 | Disposition: A | Discharge status: Home / Self Care | Payer: Medicare Other | Source: Ambulatory Visit | Attending: Internal Medicine | Admitting: Internal Medicine

## 2018-02-02 ENCOUNTER — Encounter (HOSPITAL_COMMUNITY)
Admission: RE | Disposition: A | Discharge status: Home / Self Care | Payer: Self-pay | Source: Ambulatory Visit | Attending: Internal Medicine

## 2018-02-02 ENCOUNTER — Encounter (HOSPITAL_COMMUNITY): Payer: Self-pay

## 2018-02-02 ENCOUNTER — Ambulatory Visit (HOSPITAL_BASED_OUTPATIENT_CLINIC_OR_DEPARTMENT_OTHER)
Admission: RE | Admit: 2018-02-02 | Discharge: 2018-02-02 | Disposition: A | Discharge status: Home / Self Care | Payer: Medicare Other | Source: Ambulatory Visit | Attending: Cardiology | Admitting: Cardiology

## 2018-02-02 ENCOUNTER — Ambulatory Visit (HOSPITAL_COMMUNITY)
Admission: RE | Admit: 2018-02-02 | Discharge: 2018-02-02 | Disposition: A | Discharge status: Home / Self Care | Payer: Medicare Other | Source: Ambulatory Visit | Attending: Internal Medicine | Admitting: Internal Medicine

## 2018-02-02 ENCOUNTER — Ambulatory Visit (HOSPITAL_COMMUNITY): Payer: Medicare Other | Admitting: Anesthesiology

## 2018-02-02 DIAGNOSIS — K219 Gastro-esophageal reflux disease without esophagitis: Secondary | ICD-10-CM | POA: Diagnosis not present

## 2018-02-02 DIAGNOSIS — I341 Nonrheumatic mitral (valve) prolapse: Secondary | ICD-10-CM | POA: Insufficient documentation

## 2018-02-02 DIAGNOSIS — I10 Essential (primary) hypertension: Secondary | ICD-10-CM | POA: Insufficient documentation

## 2018-02-02 DIAGNOSIS — F419 Anxiety disorder, unspecified: Secondary | ICD-10-CM | POA: Insufficient documentation

## 2018-02-02 DIAGNOSIS — F209 Schizophrenia, unspecified: Secondary | ICD-10-CM | POA: Insufficient documentation

## 2018-02-02 DIAGNOSIS — Z79899 Other long term (current) drug therapy: Secondary | ICD-10-CM | POA: Insufficient documentation

## 2018-02-02 DIAGNOSIS — I340 Nonrheumatic mitral (valve) insufficiency: Secondary | ICD-10-CM

## 2018-02-02 DIAGNOSIS — F79 Unspecified intellectual disabilities: Secondary | ICD-10-CM | POA: Diagnosis not present

## 2018-02-02 DIAGNOSIS — F17210 Nicotine dependence, cigarettes, uncomplicated: Secondary | ICD-10-CM | POA: Diagnosis not present

## 2018-02-02 DIAGNOSIS — G4733 Obstructive sleep apnea (adult) (pediatric): Secondary | ICD-10-CM | POA: Diagnosis not present

## 2018-02-02 HISTORY — PX: TEE WITHOUT CARDIOVERSION: SHX5443

## 2018-02-02 SURGERY — ECHOCARDIOGRAM, TRANSESOPHAGEAL
Anesthesia: Monitor Anesthesia Care

## 2018-02-02 MED ORDER — LIDOCAINE VISCOUS 2 % MT SOLN
3.0000 mL | Freq: Once | OROMUCOSAL | Status: AC
  Administered 2018-02-02: 3 mL via OROMUCOSAL

## 2018-02-02 MED ORDER — MIDAZOLAM HCL 2 MG/2ML IJ SOLN
INTRAMUSCULAR | Status: AC
  Filled 2018-02-02: qty 2

## 2018-02-02 MED ORDER — BUTAMBEN-TETRACAINE-BENZOCAINE 2-2-14 % EX AERO
INHALATION_SPRAY | CUTANEOUS | Status: AC
  Filled 2018-02-02: qty 5

## 2018-02-02 MED ORDER — PROPOFOL 10 MG/ML IV BOLUS
INTRAVENOUS | Status: DC | PRN
  Administered 2018-02-02 (×2): 20 mg via INTRAVENOUS

## 2018-02-02 MED ORDER — FENTANYL CITRATE (PF) 100 MCG/2ML IJ SOLN
25.0000 ug | Freq: Once | INTRAMUSCULAR | Status: AC
  Administered 2018-02-02: 25 ug via INTRAVENOUS

## 2018-02-02 MED ORDER — PROPOFOL 500 MG/50ML IV EMUL
INTRAVENOUS | Status: DC | PRN
  Administered 2018-02-02: 125 ug/kg/min via INTRAVENOUS

## 2018-02-02 MED ORDER — LACTATED RINGERS IV SOLN
INTRAVENOUS | Status: DC
  Administered 2018-02-02: 1000 mL via INTRAVENOUS

## 2018-02-02 MED ORDER — MIDAZOLAM HCL 2 MG/2ML IJ SOLN
1.0000 mg | INTRAMUSCULAR | Status: AC
  Administered 2018-02-02: 2 mg via INTRAVENOUS

## 2018-02-02 MED ORDER — SODIUM CHLORIDE BACTERIOSTATIC 0.9 % IJ SOLN
INTRAMUSCULAR | Status: DC | PRN
  Administered 2018-02-02: 10 mL

## 2018-02-02 MED ORDER — LIDOCAINE VISCOUS 2 % MT SOLN
OROMUCOSAL | Status: AC
  Filled 2018-02-02: qty 15

## 2018-02-02 MED ORDER — SODIUM CHLORIDE 0.9 % IV SOLN: INTRAVENOUS | Status: DC

## 2018-02-02 MED ORDER — FENTANYL CITRATE (PF) 100 MCG/2ML IJ SOLN
INTRAMUSCULAR | Status: AC
  Filled 2018-02-02: qty 2

## 2018-02-02 MED ORDER — SODIUM CHLORIDE BACTERIOSTATIC 0.9 % IJ SOLN
INTRAMUSCULAR | Status: AC
  Filled 2018-02-02: qty 20

## 2018-02-02 MED ORDER — LIDOCAINE HCL (CARDIAC) 20 MG/ML IV SOLN: INTRAVENOUS | Status: DC | PRN

## 2018-02-02 NOTE — Interval H&P Note (Signed)
History and Physical Interval Note:  02/02/2018 10:23 AM  Kyle Wade L Gorman  has presented today for surgery, with the diagnosis of mitral regurtation  The various methods of treatment have been discussed with the patient and family. After consideration of risks, benefits and other options for treatment, the patient has consented to  Procedure(s): TRANSESOPHAGEAL ECHOCARDIOGRAM (TEE) WITH PROPOFOL (N/A) as a surgical intervention .  The patient's history has been reviewed, patient examined, no change in status, stable for surgery.  I have reviewed the patient's chart and labs.  Questions were answered to the patient's satisfaction.     Dietrich Pates

## 2018-02-02 NOTE — Anesthesia Preprocedure Evaluation (Signed)
Anesthesia Evaluation  Patient identified by MRN, date of birth, ID band Patient awake  General Assessment Comment:Follows simple commands  Reviewed: Allergy & Precautions, NPO status , Patient's Chart, lab work & pertinent test results  History of Anesthesia Complications (+) history of anesthetic complications (failed conscious sedation for TEE)  Airway Mallampati: II  TM Distance: >3 FB     Dental  (+) Poor Dentition   Pulmonary sleep apnea , Current Smoker,    breath sounds clear to auscultation       Cardiovascular hypertension,  Rhythm:Regular Rate:Normal     Neuro/Psych PSYCHIATRIC DISORDERS Anxiety Schizophrenia    GI/Hepatic GERD  Medicated,  Endo/Other    Renal/GU      Musculoskeletal   Abdominal   Peds  Hematology   Anesthesia Other Findings   Reproductive/Obstetrics                             Anesthesia Physical Anesthesia Plan  ASA: III  Anesthesia Plan: MAC   Post-op Pain Management:    Induction: Intravenous  PONV Risk Score and Plan:   Airway Management Planned: Simple Face Mask  Additional Equipment:   Intra-op Plan:   Post-operative Plan:   Informed Consent: I have reviewed the patients History and Physical, chart, labs and discussed the procedure including the risks, benefits and alternatives for the proposed anesthesia with the patient or authorized representative who has indicated his/her understanding and acceptance.     Plan Discussed with:   Anesthesia Plan Comments:         Anesthesia Quick Evaluation

## 2018-02-02 NOTE — Transfer of Care (Signed)
Immediate Anesthesia Transfer of Care Note  Patient: Kyle Wade  Procedure(s) Performed: TRANSESOPHAGEAL ECHOCARDIOGRAM (TEE) WITH PROPOFOL (N/A )  Patient Location: PACU  Anesthesia Type:MAC  Level of Consciousness: awake and patient cooperative  Airway & Oxygen Therapy: Patient Spontanous Breathing  Post-op Assessment: Report given to RN and Post -op Vital signs reviewed and stable  Post vital signs: Reviewed and stable  Last Vitals:  Vitals:   02/02/18 1030 02/02/18 1045  BP:  (P) 122/85  Resp: 12 12  Temp:    SpO2: 94% 97%    Last Pain: There were no vitals filed for this visit.       Complications: No apparent anesthesia complications-

## 2018-02-02 NOTE — Anesthesia Postprocedure Evaluation (Signed)
Anesthesia Post Note  Patient: Kyle Wade  Procedure(s) Performed: TRANSESOPHAGEAL ECHOCARDIOGRAM (TEE) WITH PROPOFOL (N/A )  Patient location during evaluation: PACU Anesthesia Type: MAC Level of consciousness: awake and alert and patient cooperative Pain management: satisfactory to patient Vital Signs Assessment: post-procedure vital signs reviewed and stable Respiratory status: spontaneous breathing Cardiovascular status: stable Postop Assessment: no apparent nausea or vomiting Anesthetic complications: no     Last Vitals:  Vitals:   02/02/18 1045 02/02/18 1150  BP: (P) 122/85 123/83  Pulse:  88  Resp: 12 20  Temp:  36.4 C  SpO2: 97% 99%    Last Pain: There were no vitals filed for this visit.               Drucie Opitz

## 2018-02-02 NOTE — Progress Notes (Signed)
Dr. Tenny Craw in to see patient and Caregiver Inetta Fermo.

## 2018-02-02 NOTE — Progress Notes (Signed)
*  PRELIMINARY RESULTS* Echocardiogram TEE has been performed.  Kyle Wade 02/02/2018, 3:07 PM

## 2018-02-02 NOTE — Op Note (Signed)
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  There is moderate MR while sedated PV normal LVEF and RVEF normal  Chamber sizes appear normal No PFO by color doppler or by testing with injection of agitated saline  Thoracic aorta normal   Full report to follow.

## 2018-02-02 NOTE — Discharge Instructions (Signed)

## 2018-02-02 NOTE — Progress Notes (Signed)
Telephone call to Dr. Tenny Craw at [phone]. Left message for Dr. Tenny Craw to call Short Stay regarding discharge instructions.

## 2018-02-02 NOTE — Anesthesia Procedure Notes (Signed)
Procedure Name: MAC Date/Time: 02/02/2018 11:10 AM Performed by: Vista Deck, CRNA Pre-anesthesia Checklist: Patient identified, Emergency Drugs available, Suction available, Timeout performed and Patient being monitored Patient Re-evaluated:Patient Re-evaluated prior to induction Oxygen Delivery Method: Non-rebreather mask

## 2018-02-03 ENCOUNTER — Encounter (HOSPITAL_COMMUNITY): Payer: Self-pay | Admitting: Internal Medicine

## 2018-02-03 ENCOUNTER — Other Ambulatory Visit: Payer: Self-pay

## 2018-02-03 ENCOUNTER — Ambulatory Visit (INDEPENDENT_AMBULATORY_CARE_PROVIDER_SITE_OTHER): Payer: Medicare Other | Admitting: Family Medicine

## 2018-02-03 VITALS — BP 126/70 | HR 77 | Temp 97.9°F | Resp 16 | Ht 66.0 in | Wt 150.2 lb

## 2018-02-03 DIAGNOSIS — Z23 Encounter for immunization: Secondary | ICD-10-CM | POA: Diagnosis not present

## 2018-02-03 DIAGNOSIS — E785 Hyperlipidemia, unspecified: Secondary | ICD-10-CM | POA: Diagnosis not present

## 2018-02-03 DIAGNOSIS — F79 Unspecified intellectual disabilities: Secondary | ICD-10-CM

## 2018-02-03 DIAGNOSIS — E559 Vitamin D deficiency, unspecified: Secondary | ICD-10-CM

## 2018-02-03 DIAGNOSIS — F209 Schizophrenia, unspecified: Secondary | ICD-10-CM

## 2018-02-03 NOTE — Progress Notes (Signed)
Patient ID: Kyle Wade, male    DOB: Aug 12, 1956, 62 y.o.   MRN: [id number]  Chief Complaint  Patient presents with  . Follow-up    Allergies Patient has no known allergies.  Subjective:   Kyle Wade is a 62 y.o. male who presents to [facility] today.  HPI Here with group home worker. He has not been in group program many days since he was here last time. Supposed to go to the program from 9-3 pm, 5 days a week.  Caseworker reports that if he did not go to the [employer] day program each day that he would do meals on wheels or other activity for a few hours during the day.  She reports that when he goes to the [place] program that he does art, field trips, programs, and other activities.[name] He reports that does not want to go every single day b/c he reports that he gets tired. He told them in 05/2017 that he wanted to continue with the program daily. However, over the past several months, he has not wanted to go on a daily basis b/c he reports that he has not want to do that every day.  He does not like having to get up and leave so early every day.  He has participated in the program for many years.  He reports he does not like to be gone every day.  Caseworker does not believe that he is depressed.  Patient said it is not because he is sad.  He would just like some days where he does not have to go do so many things.  He has been taking his blood pressure medication as directed.  I have not received records from Dr. Jackolyn Confer office at this time.  He denies any chest pain or swelling in his extremities.  Reports he is breathing fine.  Is not had any weight loss.  Appetite is been good.  He is taking the vitamin D every week.  Caseworker reports he has been doing this for a very long time and was started on it by his psychiatrist.  Still sees psychiatry on a regular basis.  Had echo performed yesterday.  Caseworker reports that she was told by cardiology that they were going to contact his  guardian and discuss the next steps that needed to happen in regards to his cardiology visits.    Past Medical History:  Diagnosis Date  . Acute cholecystitis 01/25/2016  . Anxiety   . BPH (benign prostatic hyperplasia)   . Chronic vomiting   . GERD (gastroesophageal reflux disease)   . Hypercholesteremia   . Hypertension   . Mental retardation   . OSA (obstructive sleep apnea)    no cpap machine; could not tolerate.  . Schatzki's ring   . Schizophrenia [facility])     Past Surgical History:  Procedure Laterality Date  . BIOPSY  05/14/2017   Procedure: BIOPSY;  Surgeon: [name], MD;  [facility];  Service: Endoscopy;;  gastric polyp bx  . CHOLECYSTECTOMY N/A 01/26/2016   Procedure: LAPAROSCOPIC CHOLECYSTECTOMY;  Surgeon: [name], MD;  Location: [facility] ORS;  Service: General;  Laterality: N/A;  . COLONOSCOPY N/A 09/21/2014   [name]: right-sided diverticulosis, internal hemorrhoids. Repeat in 10 years.   . COLONOSCOPY WITH PROPOFOL N/A 05/14/2017   [name]: diverticulosis in ascending colon, otherwise normal  . ESOPHAGOGASTRODUODENOSCOPY N/A 09/21/2014   [name]: Subtle non-critical Schatzki's ring s/p 56 F dilation, query  occult cervical esophageal web. Hiatal hernia. Question gastroparesis.   [name] ESOPHAGOGASTRODUODENOSCOPY (EGD) WITH PROPOFOL N/A 05/14/2017   [name]: normal esophagus s/p empiric dilation, benign gastric polyps, normal duodenum  . MALONEY DILATION N/A 09/21/2014   Procedure: Elease Hashimoto DILATION;  Surgeon: [name], MD;  [facility];  Service: Endoscopy;  Laterality: N/A;  . Elease Hashimoto DILATION N/A 05/14/2017   Procedure: Elease Hashimoto DILATION;  Surgeon: [name], MD;  [facility];  Service: Endoscopy;  Laterality: N/A;  . None    . SAVORY DILATION N/A 09/21/2014   Procedure: SAVORY DILATION;  Surgeon: [name], MD;  [facility];  Service: Endoscopy;  Laterality: N/A;  . TEE WITHOUT CARDIOVERSION N/A  02/02/2018   Procedure: TRANSESOPHAGEAL ECHOCARDIOGRAM (TEE) WITH PROPOFOL;  Surgeon: [name], MD;  Location: [facility];  Service: Cardiovascular;  Laterality: N/A;    Family History  Problem Relation Age of Onset  . Colon cancer Unknown        unknown  . Hypertension Unknown   . Hyperlipidemia Unknown      Social History   Socioeconomic History  . Marital status: Single    Spouse name: None  . Number of children: None  . Years of education: None  . Highest education level: None  Social Needs  . Financial resource strain: None  . Food insecurity - worry: None  . Food insecurity - inability: None  . Transportation needs - medical: None  . Transportation needs - non-medical: None  Occupational History  . None  Tobacco Use  . Smoking status: Current Every Day Smoker    Packs/day: 0.25    Years: 30.00    Pack years: 7.50    Types: Cigarettes  . Smokeless tobacco: Never Used  . Tobacco comment: 4 cigarettes a day  Substance and Sexual Activity  . Alcohol use: No  . Drug use: No  . Sexual activity: No    Birth control/protection: None  Other Topics Concern  . None  Social History Narrative  . None   Current Outpatient Medications on File Prior to Visit  Medication Sig Dispense Refill  . amLODipine (NORVASC) 10 MG tablet Take 10 mg by mouth daily.    [name] buPROPion (WELLBUTRIN SR) 150 MG 12 hr tablet Take 150 mg by mouth 2 (two) times daily.    . cyclobenzaprine (FLEXERIL) 10 MG tablet Take 10 mg by mouth at bedtime.    [name] DEXILANT 60 MG capsule TAKE 1 CAPSULE BY MOUTH ONCE A DAY. (Patient taking differently: TAKE 60 MG BY MOUTH ONCE A DAY.) 30 capsule 11  . docusate sodium (COLACE) 100 MG capsule TAKE (1) CAPSULE BY MOUTH TWICE DAILY FOR CONSTIPATION. (Patient taking differently: TAKE 100 MG BY MOUTH TWICE DAILY FOR CONSTIPATION.) 60 capsule 11  . ENSURE (ENSURE) USE 1 CAN BY MOUTH TWICE DAILY 2370 mL PRN  . fluticasone (FLONASE) 50 MCG/ACT nasal spray Place 2  sprays into both nostrils daily.    [name] gemfibrozil (LOPID) 600 MG tablet TAKE 1 TABLET BY MOUTH TWICE DAILY FOR HIGH TRIGLYCERIDES. 60 tablet 11  . mirtazapine (REMERON) 15 MG tablet TAKE 1 TABLET BY MOUTH DAILY 1 HOUR BEFORE BEDTIME. (Patient taking differently: TAKE 15 MG BY MOUTH DAILY 1 HOUR BEFORE BEDTIME.) 30 tablet 11  . oxybutynin (DITROPAN) 5 MG tablet TAKE 1 TABLET BY MOUTH EVERY MORNING. 30 tablet 11  . polyethylene glycol powder (GLYCOLAX/MIRALAX) powder MIX 17 GRAMS IN 8 OUNCES. OF WATER AND  DRINK ONCE DAILY IN THE EVENING. 527 g 11  . potassium chloride (MICRO-K) 10 MEQ CR capsule Take 10 mEq by mouth 2 (two) times daily.    . ranitidine (ZANTAC) 300 MG tablet TAKE 1 TABLET BY MOUTH DAILY AT BEDTIME FOR SEVERE REFLUX. 30 tablet 11  . risperiDONE (RISPERDAL) 1 MG tablet Take 1 mg by mouth 3 (three) times daily.     . tamsulosin (FLOMAX) 0.4 MG CAPS capsule TAKE 1 CAPSULE BY MOUTH ONCE DAILY 30 MINUTES AFTER THE SAME MEAL EACH DAY. 30 capsule 11  . triamterene-hydrochlorothiazide (MAXZIDE-25) 37.5-25 MG tablet TAKE (1) TABLET BY MOUTH DAILY IN THE MORNING FOR HIGH BLOOD PRESSURE. 30 tablet 11  . Vitamin D, Ergocalciferol, (DRISDOL) 50000 UNITS CAPS capsule Take 50,000 Units by mouth every 14 (fourteen) days.     No current facility-administered medications on file prior to visit.     Review of Systems   Objective:   BP 126/70 (BP Location: Left Arm, Patient Position: Sitting, Cuff Size: Normal)   Pulse 77   Temp 97.9 F (36.6 C) (Temporal)   Resp 16   Ht 5\' 6"  (1.676 m)   Wt 150 lb 4 oz (68.2 kg)   SpO2 96%   BMI 24.25 kg/m   Physical Exam  Constitutional: He appears well-developed and well-nourished.  HENT:  Head: Normocephalic and atraumatic.  Nose: Nose normal.  Mouth/Throat: No oropharyngeal exudate.  Eyes: EOM are normal. Pupils are equal, round, and reactive to light.  Neck: Normal range of motion. Neck supple. No thyromegaly present.  Cardiovascular: Normal  rate and regular rhythm.  Murmur heard. Pulmonary/Chest: Effort normal and breath sounds normal. No respiratory distress.  Musculoskeletal: He exhibits no edema.  Neurological: He is alert. No cranial nerve deficit.  Skin: Skin is warm, dry and intact. No pallor.  Psychiatric: He has a normal mood and affect. His behavior is normal. Judgment and thought content normal.  Vitals reviewed.    Assessment and Plan  1. Hyperlipidemia, unspecified hyperlipidemia type Patient's cholesterol panel was reviewed and discussed today at the office visit.  Suspect from review of his low levels of cholesterol that he was most likely placed on gemfibrozil secondary to triglycerides or abnormalities associated with antipsychotic medication.  However, I am not sure this medication is indicated at this time.  We discussed that patient will DC gemfibrozil at this time and recheck cholesterol in 2 months. - Lipid panel  2. Immunization due Vaccination performed. - Tdap vaccine greater than or equal to 7yo IM  3.  Hypertension Electrolytes and renal function within normal limits after being on diuretic for long duration of time.  Blood pressure improved with addition of calcium channel blocker at last visit.  Continue to monitor blood pressure and continue all medications.  4.  Heart murmur/severe mitral regurgitation Awaiting cardiology recommendations.  Did discuss with patient's case manager today that if they had not heard back from patient's guardian and cardiology within the next 1-2 weeks please call my office and I will see if I can assist in helping to find out patient's recommended follow-up by cardiology.  5.  Vitamin D deficiency At this time will decrease patient's dose to vitamin D 50,000 units, 1 p.o. monthly.  We will plan to make sure levels are within normal limits in 3 months.  Check at that time  Patient is to keep his scheduled visit with gastroenterology, psychiatry. 6.  Mental  disorder/cognitive dysfunction Patient lives in a group home and has attended  the [facility] [employer] day[facility] for years.  At this time he is able to express that he does not want to go to this program on a daily basis.  I do believe that this is a reasonable request by the patient.  He would still attend an outing/service work for the other 2 out of 5 days of normal day program.  However, he would only have to attend this for less than 3 hours a day.  He is happy and expresses agreement with this plan.  Letter was written today to this effect. No Follow-up on file. Aliene Beams, MD 02/03/2018

## 2018-02-04 ENCOUNTER — Telehealth: Payer: Self-pay

## 2018-02-04 NOTE — Telephone Encounter (Signed)
Approval letter received form [employer] for Dexilant -12/15/18. Approval letter will be scanned in pts chart.

## 2018-02-04 NOTE — Telephone Encounter (Signed)
PA started for Dexilant through [email or web address], waiting on approval or denial.

## 2018-02-11 DIAGNOSIS — F209 Schizophrenia, unspecified: Secondary | ICD-10-CM | POA: Diagnosis not present

## 2018-02-23 ENCOUNTER — Other Ambulatory Visit: Payer: Self-pay | Admitting: Nurse Practitioner

## 2018-02-23 ENCOUNTER — Other Ambulatory Visit: Payer: Self-pay | Admitting: Family Medicine

## 2018-02-25 ENCOUNTER — Other Ambulatory Visit: Payer: Self-pay | Admitting: Family Medicine

## 2018-03-11 DIAGNOSIS — B351 Tinea unguium: Secondary | ICD-10-CM | POA: Diagnosis not present

## 2018-03-11 DIAGNOSIS — L110 Acquired keratosis follicularis: Secondary | ICD-10-CM | POA: Diagnosis not present

## 2018-03-11 DIAGNOSIS — I739 Peripheral vascular disease, unspecified: Secondary | ICD-10-CM | POA: Diagnosis not present

## 2018-03-12 DIAGNOSIS — F209 Schizophrenia, unspecified: Secondary | ICD-10-CM | POA: Diagnosis not present

## 2018-03-17 ENCOUNTER — Other Ambulatory Visit: Payer: Self-pay | Admitting: Family Medicine

## 2018-03-23 ENCOUNTER — Other Ambulatory Visit: Payer: Self-pay | Admitting: Family Medicine

## 2018-03-25 ENCOUNTER — Telehealth: Payer: Self-pay | Admitting: Cardiology

## 2018-03-25 ENCOUNTER — Ambulatory Visit (INDEPENDENT_AMBULATORY_CARE_PROVIDER_SITE_OTHER): Payer: Medicare Other | Admitting: Cardiology

## 2018-03-25 ENCOUNTER — Encounter: Payer: Self-pay | Admitting: Cardiology

## 2018-03-25 ENCOUNTER — Other Ambulatory Visit: Payer: Self-pay

## 2018-03-25 VITALS — BP 149/79 | HR 73 | Ht 66.0 in | Wt 148.6 lb

## 2018-03-25 DIAGNOSIS — I340 Nonrheumatic mitral (valve) insufficiency: Secondary | ICD-10-CM

## 2018-03-25 DIAGNOSIS — R0602 Shortness of breath: Secondary | ICD-10-CM | POA: Diagnosis not present

## 2018-03-25 MED ORDER — CYCLOBENZAPRINE HCL 10 MG PO TABS: ORAL_TABLET | ORAL | 1 refills | Status: DC

## 2018-03-25 NOTE — Telephone Encounter (Signed)
Echo scheduled Sep 24, 2018 at [facility]

## 2018-03-25 NOTE — Progress Notes (Signed)
Clinical Summary Mr. Ohlin is a 62 y.o.male seen today for follow up of the following medical problems.   1. MItral regurgitation/heart murmur - newly diagnosed heart murmur detected by pcp - echo Jan 2019 LVEF 60-65%, no WMAs, normal diastolic function, moderate to severe MR. LVIDs 19. The MV VTI/AV VTI of 1.5 would actually suggest severe   MR - limited history available from patient due to limited mental ability - aid mentions SOB with walking just room to room at group home which is new for him.  - generalized fatigue at home.   - since last visit completed TEE that indicated moderate MR.  - no change in symptoms since last visit.    2. Cognitive deficit/Mental handicap - has legal guardian  3. Schizophrenia - followed by pcp    SH: [name] with [place] [employer] is his guardian.   Past Medical History:  Diagnosis Date  . Acute cholecystitis 01/25/2016  . Anxiety   . BPH (benign prostatic hyperplasia)   . Chronic vomiting   . GERD (gastroesophageal reflux disease)   . Hypercholesteremia   . Hypertension   . Mental retardation   . OSA (obstructive sleep apnea)    no cpap machine; could not tolerate.  . Schatzki's ring   . Schizophrenia (HCC)      No Known Allergies   Current Outpatient Medications  Medication Sig Dispense Refill  . amLODipine (NORVASC) 10 MG tablet Take 10 mg by mouth daily.    [name] buPROPion (WELLBUTRIN SR) 150 MG 12 hr tablet Take 150 mg by mouth 2 (two) times daily.    . cyclobenzaprine (FLEXERIL) 10 MG tablet Take 10 mg by mouth at bedtime.    . cyclobenzaprine (FLEXERIL) 10 MG tablet TAKE 1 TABLET BY MOUTH AT BEDTIME FOR MUSCLE SPASMS 30 tablet 1  . DEXILANT 60 MG capsule TAKE 1 CAPSULE BY MOUTH ONCE A DAY. 30 capsule 11  . docusate sodium (COLACE) 100 MG capsule TAKE (1) CAPSULE BY MOUTH TWICE DAILY FOR CONSTIPATION. (Patient taking differently: TAKE 100 MG BY MOUTH TWICE DAILY FOR CONSTIPATION.) 60 capsule 11  . ENSURE  (ENSURE) USE 1 CAN BY MOUTH TWICE DAILY 2370 mL PRN  . fluticasone (FLONASE) 50 MCG/ACT nasal spray Place 2 sprays into both nostrils daily.    [name] gemfibrozil (LOPID) 600 MG tablet TAKE 1 TABLET BY MOUTH TWICE DAILY FOR HIGH TRIGLYCERIDES. 60 tablet 11  . mirtazapine (REMERON) 15 MG tablet TAKE 1 TABLET BY MOUTH DAILY 1 HOUR BEFORE BEDTIME. (Patient taking differently: TAKE 15 MG BY MOUTH DAILY 1 HOUR BEFORE BEDTIME.) 30 tablet 11  . oxybutynin (DITROPAN) 5 MG tablet TAKE 1 TABLET BY MOUTH EVERY MORNING. 30 tablet 11  . polyethylene glycol powder (GLYCOLAX/MIRALAX) powder MIX 17 GRAMS IN 8 OUNCES. OF WATER AND DRINK ONCE DAILY IN THE EVENING. 527 g 11  . potassium chloride (MICRO-K) 10 MEQ CR capsule Take 10 mEq by mouth 2 (two) times daily.    . ranitidine (ZANTAC) 300 MG tablet TAKE 1 TABLET BY MOUTH DAILY AT BEDTIME FOR SEVERE REFLUX. 30 tablet 11  . risperiDONE (RISPERDAL) 1 MG tablet Take 1 mg by mouth 3 (three) times daily.     . tamsulosin (FLOMAX) 0.4 MG CAPS capsule TAKE 1 CAPSULE BY MOUTH ONCE DAILY 30 MINUTES AFTER THE SAME MEAL EACH DAY. 30 capsule 11  . triamterene-hydrochlorothiazide (MAXZIDE-25) 37.5-25 MG tablet TAKE (1) TABLET BY MOUTH DAILY IN THE MORNING FOR HIGH BLOOD PRESSURE. 30 tablet 11  .  Vitamin D, Ergocalciferol, (DRISDOL) 50000 units CAPS capsule TAKE 1 CAPSULE BY MOUTH ONCE A MONTH ON THE 1ST. DO NOT CRUSH. 1 capsule 11   No current facility-administered medications for this visit.      Past Surgical History:  Procedure Laterality Date  . BIOPSY  05/14/2017   Procedure: BIOPSY;  Surgeon: [name], MD;  [facility];  Service: Endoscopy;;  gastric polyp bx  . CHOLECYSTECTOMY N/A 01/26/2016   Procedure: LAPAROSCOPIC CHOLECYSTECTOMY;  Surgeon: [name], MD;  Location: [facility];  Service: General;  Laterality: N/A;  . COLONOSCOPY N/A 09/21/2014   [name]: right-sided diverticulosis, internal hemorrhoids. Repeat in 10 years.   . COLONOSCOPY WITH  PROPOFOL N/A 05/14/2017   [name]: diverticulosis in ascending colon, otherwise normal  . ESOPHAGOGASTRODUODENOSCOPY N/A 09/21/2014   [name]: Subtle non-critical Schatzki's ring s/p 56 F dilation, query occult cervical esophageal web. Hiatal hernia. Question gastroparesis.   [name] ESOPHAGOGASTRODUODENOSCOPY (EGD) WITH PROPOFOL N/A 05/14/2017   [name]: normal esophagus s/p empiric dilation, benign gastric polyps, normal duodenum  . MALONEY DILATION N/A 09/21/2014   Procedure: Elease Hashimoto DILATION;  Surgeon: [name], MD;  [facility];  Service: Endoscopy;  Laterality: N/A;  . Elease Hashimoto DILATION N/A 05/14/2017   Procedure: Elease Hashimoto DILATION;  Surgeon: [name], MD;  [facility];  Service: Endoscopy;  Laterality: N/A;  . None    . SAVORY DILATION N/A 09/21/2014   Procedure: SAVORY DILATION;  Surgeon: [name], MD;  [facility];  Service: Endoscopy;  Laterality: N/A;  . TEE WITHOUT CARDIOVERSION N/A 02/02/2018   Procedure: TRANSESOPHAGEAL ECHOCARDIOGRAM (TEE) WITH PROPOFOL;  Surgeon: [name], MD;  Location: [facility];  Service: Cardiovascular;  Laterality: N/A;     No Known Allergies    Family History  Problem Relation Age of Onset  . Colon cancer Unknown        unknown  . Hypertension Unknown   . Hyperlipidemia Unknown      Social History Mr. Traynham reports that he has been smoking cigarettes.  He has a 7.50 pack-year smoking history. He has never used smokeless tobacco. Mr. Hoback reports that he does not drink alcohol.   Review of Systems Unable to obtain, patient does not answer questions.  .   Physical Examination Vitals:   03/25/18 1527  BP: (!) 149/79  Pulse: 73  SpO2: 97%   Vitals:   03/25/18 1527  Weight: 148 lb 9.6 oz (67.4 kg)  Height: 5\' 6"  (1.676 m)  HC: 56" (142.2 cm)    Gen: resting comfortably, no acute distress HEENT: no scleral icterus, pupils equal round and reactive, no palptable cervical  adenopathy,  CV: RRR,  3/6 systolic murmur at apex Resp: Clear to auscultation bilaterally GI: abdomen is soft, non-tender, non-distended, normal bowel sounds, no hepatosplenomegaly MSK: extremities are warm, no edema.  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 While sedated there is   moderate MR.    Assessment and Plan    1. Mitral regurgitation/SOB - combined TTE and TEE data would support moderate to severe MR. The MR jet is very eccentric and difficult to evaluate - he has normal LVEF, no evidence of chamber enlargement - his symptoms are difficult to assess due to his  mental handicap. His caregivers do report they have noticed some SOB, of note he is a long term smoker. Im not sure he would be able to cooperate with PFTs. May consider emperic trial of bronchodilators pending his symptoms at f/u.   - conitnue to monitor at this time, repeat echo after f/u in 6 months. With his valve anatomy he likely would be a candidate for MV repair when the time becomes neccesary.      Antoine Poche, M.D.

## 2018-03-25 NOTE — Patient Instructions (Signed)
Your physician wants you to follow-up in: 6 MONTHS WITH DR [facility] IN THE [place] OFFICE  You will receive a reminder letter in the mail two months in advance. If you don't receive a letter, please call our office to schedule the follow-up appointment.  Your physician recommends that you continue on your current medications as directed. Please refer to the Current Medication list given to you today.  Your physician has requested that you have an echocardiogram IN 6 MONTHS AT [facility]. Echocardiography is a painless test that uses sound waves to create images of your heart. It provides your doctor with information about the size and shape of your heart and how well your heart's chambers and valves are working. This procedure takes approximately one hour. There are no restrictions for this procedure.  Thank you for choosing [facility]!!

## 2018-03-29 ENCOUNTER — Encounter: Payer: Self-pay | Admitting: Cardiology

## 2018-04-07 ENCOUNTER — Other Ambulatory Visit: Payer: Self-pay

## 2018-04-07 ENCOUNTER — Ambulatory Visit (INDEPENDENT_AMBULATORY_CARE_PROVIDER_SITE_OTHER): Payer: Medicare Other | Admitting: Family Medicine

## 2018-04-07 ENCOUNTER — Other Ambulatory Visit (HOSPITAL_COMMUNITY)
Admission: RE | Admit: 2018-04-07 | Discharge: 2018-04-07 | Disposition: A | Discharge status: Home / Self Care | Payer: Medicare Other | Source: Ambulatory Visit | Attending: Gastroenterology | Admitting: Gastroenterology

## 2018-04-07 ENCOUNTER — Encounter: Payer: Self-pay | Admitting: Family Medicine

## 2018-04-07 VITALS — BP 110/76 | HR 76 | Temp 98.4°F | Resp 16 | Ht 66.0 in | Wt 148.0 lb

## 2018-04-07 DIAGNOSIS — E782 Mixed hyperlipidemia: Secondary | ICD-10-CM

## 2018-04-07 DIAGNOSIS — Z720 Tobacco use: Secondary | ICD-10-CM

## 2018-04-07 DIAGNOSIS — R7989 Other specified abnormal findings of blood chemistry: Secondary | ICD-10-CM | POA: Diagnosis not present

## 2018-04-07 DIAGNOSIS — D702 Other drug-induced agranulocytosis: Secondary | ICD-10-CM | POA: Diagnosis not present

## 2018-04-07 DIAGNOSIS — R911 Solitary pulmonary nodule: Secondary | ICD-10-CM

## 2018-04-07 DIAGNOSIS — I10 Essential (primary) hypertension: Secondary | ICD-10-CM

## 2018-04-07 DIAGNOSIS — R918 Other nonspecific abnormal finding of lung field: Secondary | ICD-10-CM

## 2018-04-07 DIAGNOSIS — Z79899 Other long term (current) drug therapy: Secondary | ICD-10-CM

## 2018-04-07 LAB — HEPATIC FUNCTION PANEL
ALBUMIN: 4.2 g/dL (ref 3.5–5.0)
ALK PHOS: 80 U/L (ref 38–126)
ALT: 20 U/L (ref 17–63)
AST: 24 U/L (ref 15–41)
Bilirubin, Direct: 0.1 mg/dL (ref 0.1–0.5)
Indirect Bilirubin: 0.8 mg/dL (ref 0.3–0.9)
TOTAL PROTEIN: 7.4 g/dL (ref 6.5–8.1)
Total Bilirubin: 0.9 mg/dL (ref 0.3–1.2)

## 2018-04-07 NOTE — Progress Notes (Signed)
Patient ID: Kyle Wade, male    DOB: 1956-10-31, 62 y.o.   MRN: [id number]  Chief Complaint  Patient presents with  . Follow-up    Allergies Patient has no known allergies.  Subjective:   Kyle Wade is a 62 y.o. male who presents to [facility] today.  HPI Kyle Wade presents today with his caregiver, Thelma Barge, from the group home.  Jarrod reports that he feels good.  He denies any chest pain, shortness of breath, or swelling in his extremities.  He reports that he is doing well.  He does smoke 4 cigarettes a day.  He does not want to quit smoking at this time.  He denies any problems.  Caregiver reports that he is taking all his medications as directed.  Reports he is eating a good diet.  Does also drink to ensure a day.  Reports that patient really likes drinking the Ensure.  Reports bowel movements are normal.   Past Medical History:  Diagnosis Date  . Acute cholecystitis 01/25/2016  . Anxiety   . BPH (benign prostatic hyperplasia)   . Chronic vomiting   . GERD (gastroesophageal reflux disease)   . Hypercholesteremia   . Hypertension   . Mental retardation   . OSA (obstructive sleep apnea)    no cpap machine; could not tolerate.  . Schatzki's ring   . Schizophrenia [facility])     Past Surgical History:  Procedure Laterality Date  . BIOPSY  05/14/2017   Procedure: BIOPSY;  Surgeon: [name], MD;  [facility];  Service: Endoscopy;;  gastric polyp bx  . CHOLECYSTECTOMY N/A 01/26/2016   Procedure: LAPAROSCOPIC CHOLECYSTECTOMY;  Surgeon: [name], MD;  Location: [facility] OR[facility];  Service: General;  Laterality: N/A;  . COLONOSCOPY N/A 09/21/2014   [name]: right-sided diverticulosis, internal hemorrhoids. Repeat in 10 years.   . COLONOSCOPY WITH PROPOFOL N/A 05/14/2017   [name]: diverticulosis in ascending colon, otherwise normal  . ESOPHAGOGASTRODUODENOSCOPY N/A 09/21/2014   [name]: Subtle non-critical Schatzki's ring s/p 56 F dilation, query occult  cervical esophageal web. Hiatal hernia. Question gastroparesis.   [name] ESOPHAGOGASTRODUODENOSCOPY (EGD) WITH PROPOFOL N/A 05/14/2017   [name]: normal esophagus s/p empiric dilation, benign gastric polyps, normal duodenum  . MALONEY DILATION N/A 09/21/2014   Procedure: Elease Hashimoto DILATION;  Surgeon: [name], MD;  [facility];  Service: Endoscopy;  Laterality: N/A;  . Elease Hashimoto DILATION N/A 05/14/2017   Procedure: Elease Hashimoto DILATION;  Surgeon: [name], MD;  [facility];  Service: Endoscopy;  Laterality: N/A;  . None    . SAVORY DILATION N/A 09/21/2014   Procedure: SAVORY DILATION;  Surgeon: [name], MD;  [facility];  Service: Endoscopy;  Laterality: N/A;  . TEE WITHOUT CARDIOVERSION N/A 02/02/2018   Procedure: TRANSESOPHAGEAL ECHOCARDIOGRAM (TEE) WITH PROPOFOL;  Surgeon: [name], MD;  [facility];  Service: Cardiovascular;  Laterality: N/A;    Family History  Problem Relation Age of Onset  . Colon cancer Unknown        unknown  . Hypertension Unknown   . Hyperlipidemia Unknown      Social History   Socioeconomic History  . Marital status: Single    Spouse name: Not on file  . Number of children: Not on file  . Years of education: Not on file  . Highest education level: Not on file  Occupational History  . Not on file  Social Needs  .  Financial resource strain: Not on file  . Food insecurity:    Worry: Not on file    Inability: Not on file  . Transportation needs:    Medical: Not on file    Non-medical: Not on file  Tobacco Use  . Smoking status: Current Every Day Smoker    Packs/day: 0.25    Years: 30.00    Pack years: 7.50    Types: Cigarettes  . Smokeless tobacco: Never Used  . Tobacco comment: 4 cigarettes a day  Substance and Sexual Activity  . Alcohol use: No  . Drug use: No  . Sexual activity: Never    Birth control/protection: None  Lifestyle  . Physical activity:    Days per week: Not on file     Minutes per session: Not on file  . Stress: Not on file  Relationships  . Social connections:    Talks on phone: Not on file    Gets together: Not on file    Attends religious service: Not on file    Active member of club or organization: Not on file    Attends meetings of clubs or organizations: Not on file    Relationship status: Not on file  Other Topics Concern  . Not on file  Social History Narrative  . Not on file   Current Outpatient Medications on File Prior to Visit  Medication Sig Dispense Refill  . amLODipine (NORVASC) 10 MG tablet Take 10 mg by mouth daily.    [name] buPROPion (WELLBUTRIN SR) 150 MG 12 hr tablet Take 150 mg by mouth 2 (two) times daily.    [name] DEXILANT 60 MG capsule TAKE 1 CAPSULE BY MOUTH ONCE A DAY. 30 capsule 11  . docusate sodium (COLACE) 100 MG capsule TAKE (1) CAPSULE BY MOUTH TWICE DAILY FOR CONSTIPATION. (Patient taking differently: TAKE 100 MG BY MOUTH TWICE DAILY FOR CONSTIPATION.) 60 capsule 11  . ENSURE (ENSURE) USE 1 CAN BY MOUTH TWICE DAILY 2370 mL PRN  . fluticasone (FLONASE) 50 MCG/ACT nasal spray Place 2 sprays into both nostrils daily.    . mirtazapine (REMERON) 15 MG tablet TAKE 1 TABLET BY MOUTH DAILY 1 HOUR BEFORE BEDTIME. (Patient taking differently: TAKE 15 MG BY MOUTH DAILY 1 HOUR BEFORE BEDTIME.) 30 tablet 11  . oxybutynin (DITROPAN) 5 MG tablet TAKE 1 TABLET BY MOUTH EVERY MORNING. 30 tablet 11  . polyethylene glycol powder (GLYCOLAX/MIRALAX) powder MIX 17 GRAMS IN 8 OUNCES. OF WATER AND DRINK ONCE DAILY IN THE EVENING. 527 g 11  . potassium chloride (MICRO-K) 10 MEQ CR capsule Take 10 mEq by mouth 2 (two) times daily.    . ranitidine (ZANTAC) 300 MG tablet TAKE 1 TABLET BY MOUTH DAILY AT BEDTIME FOR SEVERE REFLUX. 30 tablet 11  . tamsulosin (FLOMAX) 0.4 MG CAPS capsule TAKE 1 CAPSULE BY MOUTH ONCE DAILY 30 MINUTES AFTER THE SAME MEAL EACH DAY. 30 capsule 11  . thioridazine (MELLARIL) 50 MG tablet 1/2 tab by mouth every morning & 1 tab  every evening    . triamterene-hydrochlorothiazide (MAXZIDE-25) 37.5-25 MG tablet TAKE (1) TABLET BY MOUTH DAILY IN THE MORNING FOR HIGH BLOOD PRESSURE. 30 tablet 11  . Vitamin D, Ergocalciferol, (DRISDOL) 50000 units CAPS capsule TAKE 1 CAPSULE BY MOUTH ONCE A MONTH ON THE 1ST. DO NOT CRUSH. 1 capsule 11   No current facility-administered medications on file prior to visit.     Review of Systems   Objective:   BP 110/76 (BP Location: Left Arm,  Patient Position: Sitting, Cuff Size: Normal)   Pulse 76   Temp 98.4 F (36.9 C) (Temporal)   Resp 16   Ht 5\' 6"  (1.676 m)   Wt 148 lb (67.1 kg)   SpO2 98%   BMI 23.89 kg/m   Physical Exam  Constitutional: He is oriented to person, place, and time. He appears well-developed and well-nourished.  HENT:  Head: Normocephalic and atraumatic.  Eyes: Pupils are equal, round, and reactive to light. Conjunctivae and EOM are normal.  Neck: Normal range of motion. Neck supple. No JVD present. No tracheal deviation present. No thyromegaly present.  Cardiovascular: Normal rate and regular rhythm.  Murmur heard. Pulmonary/Chest: Effort normal and breath sounds normal.  Musculoskeletal: He exhibits no edema.  Lymphadenopathy:    He has no cervical adenopathy.  Neurological: He is alert and oriented to person, place, and time. No cranial nerve deficit.  Skin: Skin is warm, dry and intact. Capillary refill takes less than 2 seconds.  Psychiatric: His behavior is normal.  62 year old male.  Alert and oriented.  No acute distress.  Obvious cognitive dysfunction.  Unable to answer many questions.  Does answer simple questions.  Reports his mood is good.  Denies suicidal or homicidal ideations.  Vitals reviewed.    Assessment and Plan  1. Other drug-induced neutropenia (HCC) Check CBC.  Patient has previously been neutropenic and had a history of low platelets.  Was thought to be secondary to his psychiatric medications.  Due to benefit of medications,  it was recommended to continue psychiatric medications but monitor CBC q. 6 months.  Will refer and follow-up at cancer center as needed. - CBC with Differential/Platelet  2. Mixed hyperlipidemia Several months ago, patient was taken off gemfibrozil after review of his FLP.  At this time we will recheck his cholesterol panel with special attention to his triglycerides.  Consider starting medication back if indicated. - Lipid panel  3. High risk medication use Check labs today.  Due to hypertensive medications.  Blood pressure is stable. - COMPLETE METABOLIC PANEL WITH GFR  4. Abnormal findings on diagnostic imaging of lung CT scan of the chest reviewed which was done several years ago which revealed a pulmonary nodule with no subsequent follow-up.  I do feel at this time the CT is warranted due for her to follow-up and that patient is currently being worked up for shortness of breath.  He does smoke but does not want to quit smoking at this time.  He does smoke 4 cigarettes a day.  We will continue to discuss tobacco cessation at next visit. - CT CHEST NODULE FOLLOW UP LOW DOSE W/O; Future  5. Lung nodule Testing ordered. - CT CHEST NODULE FOLLOW UP LOW DOSE W/O; Future  Patient has severe mitral regurgitation and will keep his scheduled follow-up with cardiology/Dr. Wyline Mood. Keep scheduled appointments with psychiatry. Note given to group home to bring him in for labs fasting in the morning. Continue current blood pressure medications.  Continue to monitor weight.  Patient has lost 2 pounds since his last visit. Paperwork completed for group home.  Copy to be scanned into chart.  Return in about 2 months (around 06/07/2018) for Follow-up. Aliene Beams, MD 04/07/2018

## 2018-04-08 DIAGNOSIS — Z79899 Other long term (current) drug therapy: Secondary | ICD-10-CM | POA: Diagnosis not present

## 2018-04-08 DIAGNOSIS — D702 Other drug-induced agranulocytosis: Secondary | ICD-10-CM | POA: Diagnosis not present

## 2018-04-08 DIAGNOSIS — E782 Mixed hyperlipidemia: Secondary | ICD-10-CM | POA: Diagnosis not present

## 2018-04-08 LAB — CBC WITH DIFFERENTIAL/PLATELET
BASOS ABS: 21 {cells}/uL (ref 0–200)
Basophils Relative: 0.7 %
EOS PCT: 1 %
Eosinophils Absolute: 30 cells/uL (ref 15–500)
HCT: 43.3 % (ref 38.5–50.0)
HEMOGLOBIN: 15 g/dL (ref 13.2–17.1)
LYMPHS ABS: 1230 {cells}/uL (ref 850–3900)
MCH: 30.9 pg (ref 27.0–33.0)
MCHC: 34.6 g/dL (ref 32.0–36.0)
MCV: 89.3 fL (ref 80.0–100.0)
MPV: 10.9 fL (ref 7.5–12.5)
Monocytes Relative: 8.1 %
NEUTROS PCT: 49.2 %
Neutro Abs: 1476 cells/uL — ABNORMAL LOW (ref 1500–7800)
Platelets: 129 10*3/uL — ABNORMAL LOW (ref 140–400)
RBC: 4.85 10*6/uL (ref 4.20–5.80)
RDW: 12 % (ref 11.0–15.0)
Total Lymphocyte: 41 %
WBC mixed population: 243 cells/uL (ref 200–950)
WBC: 3 10*3/uL — ABNORMAL LOW (ref 3.8–10.8)

## 2018-04-08 LAB — COMPLETE METABOLIC PANEL WITHOUT GFR
AG Ratio: 1.6 (calc) (ref 1.0–2.5)
ALT: 16 U/L (ref 9–46)
AST: 19 U/L (ref 10–35)
Albumin: 4.4 g/dL (ref 3.6–5.1)
Alkaline phosphatase (APISO): 78 U/L (ref 40–115)
BUN: 9 mg/dL (ref 7–25)
CO2: 34 mmol/L — ABNORMAL HIGH (ref 20–32)
Calcium: 9.5 mg/dL (ref 8.6–10.3)
Chloride: 101 mmol/L (ref 98–110)
Creat: 1.21 mg/dL (ref 0.70–1.25)
GFR, Est African American: 74 mL/min/{1.73_m2}
GFR, Est Non African American: 64 mL/min/{1.73_m2}
Globulin: 2.7 g/dL (ref 1.9–3.7)
Glucose, Bld: 74 mg/dL (ref 65–99)
Potassium: 4.1 mmol/L (ref 3.5–5.3)
Sodium: 139 mmol/L (ref 135–146)
Total Bilirubin: 0.5 mg/dL (ref 0.2–1.2)
Total Protein: 7.1 g/dL (ref 6.1–8.1)

## 2018-04-08 LAB — LIPID PANEL
CHOL/HDL RATIO: 2.6 (calc) (ref <5.0)
CHOLESTEROL: 175 mg/dL (ref <200)
HDL: 67 mg/dL (ref >40)
LDL Cholesterol (Calc): 96 mg/dL (calc)
Non-HDL Cholesterol (Calc): 108 mg/dL (calc) (ref <130)
Triglycerides: 42 mg/dL (ref <150)

## 2018-04-17 ENCOUNTER — Other Ambulatory Visit: Payer: Self-pay | Admitting: Family Medicine

## 2018-04-17 DIAGNOSIS — R918 Other nonspecific abnormal finding of lung field: Secondary | ICD-10-CM

## 2018-04-17 NOTE — Progress Notes (Signed)
Please make sure this is scheduled. Thanks.

## 2018-04-21 ENCOUNTER — Ambulatory Visit (HOSPITAL_COMMUNITY)
Admission: RE | Admit: 2018-04-21 | Discharge: 2018-04-21 | Disposition: A | Discharge status: Home / Self Care | Payer: Medicare Other | Source: Ambulatory Visit | Attending: Family Medicine | Admitting: Family Medicine

## 2018-04-21 DIAGNOSIS — R918 Other nonspecific abnormal finding of lung field: Secondary | ICD-10-CM | POA: Diagnosis not present

## 2018-04-21 DIAGNOSIS — R911 Solitary pulmonary nodule: Secondary | ICD-10-CM

## 2018-04-22 NOTE — Progress Notes (Signed)
Spoke with Latonya at the group home and gave results with verbal understanding. Will send a message to Dr.Hagler to see when she would like to see patient for follow up to discuss CT

## 2018-05-06 ENCOUNTER — Ambulatory Visit (HOSPITAL_COMMUNITY): Payer: Medicare Other

## 2018-05-11 ENCOUNTER — Other Ambulatory Visit (HOSPITAL_COMMUNITY): Payer: Self-pay | Admitting: *Deleted

## 2018-05-11 DIAGNOSIS — D702 Other drug-induced agranulocytosis: Secondary | ICD-10-CM

## 2018-05-14 ENCOUNTER — Inpatient Hospital Stay (HOSPITAL_COMMUNITY): Payer: Medicare Other | Attending: Hematology

## 2018-05-14 DIAGNOSIS — D709 Neutropenia, unspecified: Secondary | ICD-10-CM | POA: Diagnosis not present

## 2018-05-14 DIAGNOSIS — D702 Other drug-induced agranulocytosis: Secondary | ICD-10-CM

## 2018-05-14 LAB — COMPREHENSIVE METABOLIC PANEL
ALBUMIN: 4 g/dL (ref 3.5–5.0)
ALK PHOS: 72 U/L (ref 38–126)
ALT: 16 U/L — AB (ref 17–63)
AST: 20 U/L (ref 15–41)
Anion gap: 7 (ref 5–15)
BILIRUBIN TOTAL: 0.6 mg/dL (ref 0.3–1.2)
BUN: 13 mg/dL (ref 6–20)
CALCIUM: 9 mg/dL (ref 8.9–10.3)
CO2: 28 mmol/L (ref 22–32)
CREATININE: 1 mg/dL (ref 0.61–1.24)
Chloride: 98 mmol/L — ABNORMAL LOW (ref 101–111)
GFR calc Af Amer: >60 mL/min (ref >60)
GFR calc non Af Amer: >60 mL/min (ref >60)
GLUCOSE: 120 mg/dL — AB (ref 65–99)
Potassium: 3.5 mmol/L (ref 3.5–5.1)
SODIUM: 133 mmol/L — AB (ref 135–145)
TOTAL PROTEIN: 7 g/dL (ref 6.5–8.1)

## 2018-05-14 LAB — CBC WITH DIFFERENTIAL/PLATELET
BASOS ABS: 0 10*3/uL (ref 0.0–0.1)
BASOS PCT: 0 %
EOS ABS: 0 10*3/uL (ref 0.0–0.7)
Eosinophils Relative: 1 %
HEMATOCRIT: 42 % (ref 39.0–52.0)
HEMOGLOBIN: 14.1 g/dL (ref 13.0–17.0)
Lymphocytes Relative: 36 %
Lymphs Abs: 1.3 10*3/uL (ref 0.7–4.0)
MCH: 30 pg (ref 26.0–34.0)
MCHC: 33.6 g/dL (ref 30.0–36.0)
MCV: 89.4 fL (ref 78.0–100.0)
MONOS PCT: 7 %
Monocytes Absolute: 0.3 10*3/uL (ref 0.1–1.0)
NEUTROS ABS: 2 10*3/uL (ref 1.7–7.7)
Neutrophils Relative %: 56 %
Platelets: 123 10*3/uL — ABNORMAL LOW (ref 150–400)
RBC: 4.7 MIL/uL (ref 4.22–5.81)
RDW: 12.2 % (ref 11.5–15.5)
WBC: 3.5 10*3/uL — AB (ref 4.0–10.5)

## 2018-05-21 ENCOUNTER — Encounter: Payer: Self-pay | Admitting: Family Medicine

## 2018-05-21 ENCOUNTER — Inpatient Hospital Stay (HOSPITAL_COMMUNITY): Payer: Medicare Other | Attending: Hematology | Admitting: Hematology

## 2018-05-22 ENCOUNTER — Encounter: Payer: Self-pay | Admitting: Family Medicine

## 2018-05-25 ENCOUNTER — Inpatient Hospital Stay (HOSPITAL_COMMUNITY): Payer: Medicare Other | Attending: Hematology | Admitting: Hematology

## 2018-05-25 ENCOUNTER — Encounter (HOSPITAL_COMMUNITY): Payer: Self-pay | Admitting: Hematology

## 2018-05-25 ENCOUNTER — Other Ambulatory Visit: Payer: Self-pay

## 2018-05-25 VITALS — BP 154/81 | HR 79 | Temp 98.9°F | Resp 16 | Wt 152.0 lb

## 2018-05-25 DIAGNOSIS — K219 Gastro-esophageal reflux disease without esophagitis: Secondary | ICD-10-CM | POA: Insufficient documentation

## 2018-05-25 DIAGNOSIS — D72819 Decreased white blood cell count, unspecified: Secondary | ICD-10-CM | POA: Diagnosis not present

## 2018-05-25 DIAGNOSIS — N400 Enlarged prostate without lower urinary tract symptoms: Secondary | ICD-10-CM

## 2018-05-25 DIAGNOSIS — F209 Schizophrenia, unspecified: Secondary | ICD-10-CM | POA: Insufficient documentation

## 2018-05-25 DIAGNOSIS — D702 Other drug-induced agranulocytosis: Secondary | ICD-10-CM

## 2018-05-25 DIAGNOSIS — R1110 Vomiting, unspecified: Secondary | ICD-10-CM

## 2018-05-25 DIAGNOSIS — Z800 Family history of malignant neoplasm of digestive organs: Secondary | ICD-10-CM

## 2018-05-25 DIAGNOSIS — R5383 Other fatigue: Secondary | ICD-10-CM | POA: Diagnosis not present

## 2018-05-25 DIAGNOSIS — K222 Esophageal obstruction: Secondary | ICD-10-CM | POA: Insufficient documentation

## 2018-05-25 DIAGNOSIS — E7800 Pure hypercholesterolemia, unspecified: Secondary | ICD-10-CM | POA: Diagnosis not present

## 2018-05-25 DIAGNOSIS — G4733 Obstructive sleep apnea (adult) (pediatric): Secondary | ICD-10-CM | POA: Diagnosis not present

## 2018-05-25 DIAGNOSIS — D696 Thrombocytopenia, unspecified: Secondary | ICD-10-CM | POA: Diagnosis not present

## 2018-05-25 DIAGNOSIS — F79 Unspecified intellectual disabilities: Secondary | ICD-10-CM | POA: Diagnosis not present

## 2018-05-25 DIAGNOSIS — F419 Anxiety disorder, unspecified: Secondary | ICD-10-CM | POA: Insufficient documentation

## 2018-05-25 DIAGNOSIS — I10 Essential (primary) hypertension: Secondary | ICD-10-CM | POA: Diagnosis not present

## 2018-05-25 DIAGNOSIS — Z79899 Other long term (current) drug therapy: Secondary | ICD-10-CM | POA: Diagnosis not present

## 2018-05-25 NOTE — Assessment & Plan Note (Signed)
1.  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  Both thioridazine and mirtazapine are known to cause mild leukopenia and thrombocytopenia. -The latest blood count shows white count of 3.5, ANC of 2000, platelet count of 123.  No B symptoms are palpable hepatospleno megaly.  No active intervention necessary. -We will see him back in 6 months with repeat blood work.  I will also repeat B12 and folic acid and LDH at that time.

## 2018-05-25 NOTE — Progress Notes (Signed)
[facility] [address], [place] [address]   CLINIC:  Medical Oncology/Hematology  PCP:  Aliene Beams, MD [address] [facility] [address] [place] [address] [phone]   REASON FOR VISIT:  Follow-up for leukopenia and thrombocytopenia.  CURRENT THERAPY: Observation.   INTERVAL HISTORY:  Kyle Wade 62 y.o. male returns for follow-up of mild leukopenia and thrombus cytopenia.  In the preceding 6 months, he and the person accompanying him from his facility deny any recurrent infections or hospitalizations.  Denies any fevers, night sweats or weight loss in the last 6 months.  He is drinking Ensure 2 cans daily.  Denies any bleeding per rectum or melena.  Denies any easy bruising.    REVIEW OF SYSTEMS:  Review of Systems  Constitutional: Positive for fatigue.  All other systems reviewed and are negative.    PAST MEDICAL/SURGICAL HISTORY:  Past Medical History:  Diagnosis Date  . Acute cholecystitis 01/25/2016  . Anxiety   . BPH (benign prostatic hyperplasia)   . Chronic vomiting   . GERD (gastroesophageal reflux disease)   . Hypercholesteremia   . Hypertension   . Mental retardation   . OSA (obstructive sleep apnea)    no cpap machine; could not tolerate.  . Schatzki's ring   . Schizophrenia [facility])    Past Surgical History:  Procedure Laterality Date  . BIOPSY  05/14/2017   Procedure: BIOPSY;  Surgeon: [name], MD;  [facility];  Service: Endoscopy;;  gastric polyp bx  . CHOLECYSTECTOMY N/A 01/26/2016   Procedure: LAPAROSCOPIC CHOLECYSTECTOMY;  Surgeon: [name], MD;  Location: [facility];  Service: General;  Laterality: N/A;  . COLONOSCOPY N/A 09/21/2014   [name]: right-sided diverticulosis, internal hemorrhoids. Repeat in 10 years.   . COLONOSCOPY WITH PROPOFOL N/A 05/14/2017   [name]: diverticulosis in ascending colon, otherwise normal  . ESOPHAGOGASTRODUODENOSCOPY N/A 09/21/2014   [name]: Subtle non-critical Schatzki's  ring s/p 56 F dilation, query occult cervical esophageal web. Hiatal hernia. Question gastroparesis.   [name] ESOPHAGOGASTRODUODENOSCOPY (EGD) WITH PROPOFOL N/A 05/14/2017   [name]: normal esophagus s/p empiric dilation, benign gastric polyps, normal duodenum  . MALONEY DILATION N/A 09/21/2014   Procedure: Elease Hashimoto DILATION;  Surgeon: [name], MD;  [facility];  Service: Endoscopy;  Laterality: N/A;  . Elease Hashimoto DILATION N/A 05/14/2017   Procedure: Elease Hashimoto DILATION;  Surgeon: [name], MD;  [facility];  Service: Endoscopy;  Laterality: N/A;  . None    . SAVORY DILATION N/A 09/21/2014   Procedure: SAVORY DILATION;  Surgeon: [name], MD;  [facility];  Service: Endoscopy;  Laterality: N/A;  . TEE WITHOUT CARDIOVERSION N/A 02/02/2018   Procedure: TRANSESOPHAGEAL ECHOCARDIOGRAM (TEE) WITH PROPOFOL;  Surgeon: [name], MD;  [facility];  Service: Cardiovascular;  Laterality: N/A;     SOCIAL HISTORY:  Social History   Socioeconomic History  . Marital status: Single    Spouse name: Not on file  . Number of children: Not on file  . Years of education: Not on file  . Highest education level: Not on file  Occupational History  . Not on file  Social Needs  . Financial resource strain: Not on file  . Food insecurity:    Worry: Not on file    Inability: Not on file  . Transportation needs:    Medical: Not on file    Non-medical: Not on file  Tobacco  Use  . Smoking status: Current Every Day Smoker    Packs/day: 0.25    Years: 30.00    Pack years: 7.50    Types: Cigarettes  . Smokeless tobacco: Never Used  . Tobacco comment: 4 cigarettes a day  Substance and Sexual Activity  . Alcohol use: No  . Drug use: No  . Sexual activity: Never    Birth control/protection: None  Lifestyle  . Physical activity:    Days per week: Not on file    Minutes per session: Not on file  . Stress: Not on file  Relationships  . Social  connections:    Talks on phone: Not on file    Gets together: Not on file    Attends religious service: Not on file    Active member of club or organization: Not on file    Attends meetings of clubs or organizations: Not on file    Relationship status: Not on file  . Intimate partner violence:    Fear of current or ex partner: Not on file    Emotionally abused: Not on file    Physically abused: Not on file    Forced sexual activity: Not on file  Other Topics Concern  . Not on file  Social History Narrative  . Not on file    FAMILY HISTORY:  Family History  Problem Relation Age of Onset  . Colon cancer Unknown        unknown  . Hypertension Unknown   . Hyperlipidemia Unknown     CURRENT MEDICATIONS:  Outpatient Encounter Medications as of 05/25/2018  Medication Sig  . amLODipine (NORVASC) 10 MG tablet Take 10 mg by mouth daily.  [name] buPROPion (WELLBUTRIN SR) 150 MG 12 hr tablet Take 150 mg by mouth 2 (two) times daily.  [name] DEXILANT 60 MG capsule TAKE 1 CAPSULE BY MOUTH ONCE A DAY.  [name] docusate sodium (COLACE) 100 MG capsule TAKE (1) CAPSULE BY MOUTH TWICE DAILY FOR CONSTIPATION. (Patient taking differently: TAKE 100 MG BY MOUTH TWICE DAILY FOR CONSTIPATION.)  . ENSURE (ENSURE) USE 1 CAN BY MOUTH TWICE DAILY  . fluticasone (FLONASE) 50 MCG/ACT nasal spray Place 2 sprays into both nostrils daily.  . mirtazapine (REMERON) 15 MG tablet TAKE 1 TABLET BY MOUTH DAILY 1 HOUR BEFORE BEDTIME. (Patient taking differently: TAKE 15 MG BY MOUTH DAILY 1 HOUR BEFORE BEDTIME.)  . oxybutynin (DITROPAN) 5 MG tablet TAKE 1 TABLET BY MOUTH EVERY MORNING.  [name] polyethylene glycol powder (GLYCOLAX/MIRALAX) powder MIX 17 GRAMS IN 8 OUNCES. OF WATER AND DRINK ONCE DAILY IN THE EVENING.  [name] potassium chloride (MICRO-K) 10 MEQ CR capsule Take 10 mEq by mouth 2 (two) times daily.  . ranitidine (ZANTAC) 300 MG tablet TAKE 1 TABLET BY MOUTH DAILY AT BEDTIME FOR SEVERE REFLUX.  . tamsulosin (FLOMAX) 0.4 MG CAPS  capsule TAKE 1 CAPSULE BY MOUTH ONCE DAILY 30 MINUTES AFTER THE SAME MEAL EACH DAY.  [name] thioridazine (MELLARIL) 50 MG tablet 1/2 tab by mouth every morning & 1 tab every evening  . triamterene-hydrochlorothiazide (MAXZIDE-25) 37.5-25 MG tablet TAKE (1) TABLET BY MOUTH DAILY IN THE MORNING FOR HIGH BLOOD PRESSURE.  . Vitamin D, Ergocalciferol, (DRISDOL) 50000 units CAPS capsule TAKE 1 CAPSULE BY MOUTH ONCE A MONTH ON THE 1ST. DO NOT CRUSH.   No facility-administered encounter medications on file as of 05/25/2018.     ALLERGIES:  No Known Allergies   PHYSICAL EXAM:  ECOG Performance status: 1  Vitals:   05/25/18  1555  BP: (!) 154/81  Pulse: 79  Resp: 16  Temp: 98.9 F (37.2 C)  SpO2: 99%   Filed Weights   05/25/18 1555  Weight: 152 lb (68.9 kg)    Physical Exam HEENT: No thrush or mucositis. Lymphadenopathy: No palpable adenopathy in the neck, axilla or inguinal region. Abdomen: No palpable hepatospleno megaly.  LABORATORY DATA:  I have reviewed the labs as listed.  CBC    Component Value Date/Time   WBC 3.5 (L) 05/14/2018 1405   RBC 4.70 05/14/2018 1405   HGB 14.1 05/14/2018 1405   HCT 42.0 05/14/2018 1405   HCT 44.3 10/30/2016 1143   PLT 123 (L) 05/14/2018 1405   MCV 89.4 05/14/2018 1405   MCH 30.0 05/14/2018 1405   MCHC 33.6 05/14/2018 1405   RDW 12.2 05/14/2018 1405   LYMPHSABS 1.3 05/14/2018 1405   MONOABS 0.3 05/14/2018 1405   EOSABS 0.0 05/14/2018 1405   BASOSABS 0.0 05/14/2018 1405   CMP Latest Ref Rng & Units 05/14/2018 04/08/2018 04/07/2018  Glucose 65 - 99 mg/dL 161(W) 74 -  BUN 6 - 20 mg/dL 13 9 -  Creatinine 9.60 - 1.24 mg/dL 4.54 0.98 -  Sodium 119 - 145 mmol/L 133(L) 139 -  Potassium 3.5 - 5.1 mmol/L 3.5 4.1 -  Chloride 101 - 111 mmol/L 98(L) 101 -  CO2 22 - 32 mmol/L 28 34(H) -  Calcium 8.9 - 10.3 mg/dL 9.0 9.5 -  Total Protein 6.5 - 8.1 g/dL 7.0 7.1 7.4  Total Bilirubin 0.3 - 1.2 mg/dL 0.6 0.5 0.9  Alkaline Phos 38 - 126 U/L 72 - 80  AST 15  - 41 U/L 20 19 24   ALT 17 - 63 U/L 16(L) 16 20       DIAGNOSTIC IMAGING:  I have independently reviewed the images of the CT scan dated 04/22/2018 which shows 2.3 cm right upper lobe lung nodule which was benign.     ASSESSMENT & PLAN:   Neutropenia (HCC) 1.  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  Both thioridazine and mirtazapine are known to cause mild leukopenia and thrombocytopenia. -The latest blood count shows white count of 3.5, ANC of 2000, platelet count of 123.  No B symptoms are palpable hepatospleno megaly.  No active intervention necessary. -We will see him back in 6 months with repeat blood work.  I will also repeat B12 and folic acid and LDH at that time.      Orders placed this encounter:  Orders Placed This Encounter  Procedures  . CBC with Differential  . Lactate dehydrogenase  . Vitamin B12  . Folate      Doreatha Massed, MD [facility] [phone]

## 2018-05-26 ENCOUNTER — Other Ambulatory Visit: Payer: Self-pay | Admitting: Family Medicine

## 2018-05-26 ENCOUNTER — Ambulatory Visit (INDEPENDENT_AMBULATORY_CARE_PROVIDER_SITE_OTHER): Payer: Medicare Other | Admitting: Family Medicine

## 2018-05-26 ENCOUNTER — Other Ambulatory Visit: Payer: Self-pay

## 2018-05-26 ENCOUNTER — Encounter: Payer: Self-pay | Admitting: Family Medicine

## 2018-05-26 VITALS — BP 136/72 | HR 82 | Temp 98.8°F | Resp 14 | Ht 67.0 in | Wt 149.0 lb

## 2018-05-26 DIAGNOSIS — Z23 Encounter for immunization: Secondary | ICD-10-CM

## 2018-05-26 DIAGNOSIS — Z720 Tobacco use: Secondary | ICD-10-CM

## 2018-05-26 DIAGNOSIS — I10 Essential (primary) hypertension: Secondary | ICD-10-CM | POA: Diagnosis not present

## 2018-05-26 DIAGNOSIS — J439 Emphysema, unspecified: Secondary | ICD-10-CM

## 2018-05-26 MED ORDER — PNEUMOCOCCAL 13-VAL CONJ VACC IM SUSP: 0.5000 mL | INTRAMUSCULAR | Status: DC

## 2018-05-26 NOTE — Progress Notes (Signed)
Patient ID: Kyle Wade, male    DOB: 24-Oct-1956, 62 y.o.   MRN: [id number]  Chief Complaint  Patient presents with  . Hypertension    follow up    Allergies Patient has no known allergies.  Subjective:   Kyle Wade is a 62 y.o. male who presents to [facility] today.  HPI Kyle Wade presents today for follow-up.  He is accompanied by [name], 1 of the care providers in patients residing group home.  Kyle Wade tells me that he is doing fine.  He denies any problems.  Reports that bowels are much better now that he is using the MiraLAX.  Was seen recently by hematology/oncology and they will continue to monitor his blood counts.  He reports his energy is good.  Appetite is good.  Has not had any weight loss.  [name] did stop Thorazine.  He is not taking prescription vitamin D at this time. Reports his energy is better.  Does not feel short of breath.  Bowel movements good.  Has not been taking the Norvasc.  Is taking the fluid pill.   Past Medical History:  Diagnosis Date  . Acute cholecystitis 01/25/2016  . Anxiety   . BPH (benign prostatic hyperplasia)   . Chronic vomiting   . GERD (gastroesophageal reflux disease)   . Hypercholesteremia   . Hypertension   . Mental retardation   . OSA (obstructive sleep apnea)    no cpap machine; could not tolerate.  . Schatzki's ring   . Schizophrenia [facility])     Past Surgical History:  Procedure Laterality Date  . BIOPSY  05/14/2017   Procedure: BIOPSY;  Surgeon: [name], MD;  [facility];  Service: Endoscopy;;  gastric polyp bx  . CHOLECYSTECTOMY N/A 01/26/2016   Procedure: LAPAROSCOPIC CHOLECYSTECTOMY;  Surgeon: [name], MD;  Location: [facility];  Service: General;  Laterality: N/A;  . COLONOSCOPY N/A 09/21/2014   [name]: right-sided diverticulosis, internal hemorrhoids. Repeat in 10 years.   . COLONOSCOPY WITH PROPOFOL N/A 05/14/2017   [name]: diverticulosis in ascending colon, otherwise normal    . ESOPHAGOGASTRODUODENOSCOPY N/A 09/21/2014   [name]: Subtle non-critical Schatzki's ring s/p 56 F dilation, query occult cervical esophageal web. Hiatal hernia. Question gastroparesis.   [name] ESOPHAGOGASTRODUODENOSCOPY (EGD) WITH PROPOFOL N/A 05/14/2017   [name]: normal esophagus s/p empiric dilation, benign gastric polyps, normal duodenum  . MALONEY DILATION N/A 09/21/2014   Procedure: Elease Hashimoto DILATION;  Surgeon: [name], MD;  [facility];  Service: Endoscopy;  Laterality: N/A;  . Elease Hashimoto DILATION N/A 05/14/2017   Procedure: Elease Hashimoto DILATION;  Surgeon: [name], MD;  [facility];  Service: Endoscopy;  Laterality: N/A;  . None    . SAVORY DILATION N/A 09/21/2014   Procedure: SAVORY DILATION;  Surgeon: [name], MD;  [facility];  Service: Endoscopy;  Laterality: N/A;  . TEE WITHOUT CARDIOVERSION N/A 02/02/2018   Procedure: TRANSESOPHAGEAL ECHOCARDIOGRAM (TEE) WITH PROPOFOL;  Surgeon: [name], MD;  [facility];  Service: Cardiovascular;  Laterality: N/A;    Family History  Problem Relation Age of Onset  . Colon cancer Unknown        unknown  . Hypertension Unknown   . Hyperlipidemia Unknown      Social History   Socioeconomic History  . Marital status: Single    Spouse name: Not on file  . Number of children: Not on file  .  Years of education: Not on file  . Highest education level: Not on file  Occupational History  . Not on file  Social Needs  . Financial resource strain: Not on file  . Food insecurity:    Worry: Not on file    Inability: Not on file  . Transportation needs:    Medical: Not on file    Non-medical: Not on file  Tobacco Use  . Smoking status: Current Every Day Smoker    Packs/day: 0.25    Years: 30.00    Pack years: 7.50    Types: Cigarettes  . Smokeless tobacco: Never Used  . Tobacco comment: 4 cigarettes a day  Substance and Sexual Activity  . Alcohol use: No  . Drug use: No   . Sexual activity: Never    Birth control/protection: None  Lifestyle  . Physical activity:    Days per week: Not on file    Minutes per session: Not on file  . Stress: Not on file  Relationships  . Social connections:    Talks on phone: Not on file    Gets together: Not on file    Attends religious service: Not on file    Active member of club or organization: Not on file    Attends meetings of clubs or organizations: Not on file    Relationship status: Not on file  Other Topics Concern  . Not on file  Social History Narrative  . Not on file   Current Outpatient Medications on File Prior to Visit  Medication Sig Dispense Refill  . ARIPiprazole (ABILIFY) 10 MG tablet Take 5 mg by mouth daily. Every morning    . ARIPiprazole (ABILIFY) 5 MG tablet Take 2.5 mg by mouth daily. With supper    . buPROPion (WELLBUTRIN SR) 150 MG 12 hr tablet Take 150 mg by mouth 2 (two) times daily.    . cyclobenzaprine (FLEXERIL) 10 MG tablet Take 10 mg by mouth 3 times/day as needed-between meals & bedtime for muscle spasms.    . DEXILANT 60 MG capsule TAKE 1 CAPSULE BY MOUTH ONCE A DAY. 30 capsule 11  . docusate sodium (COLACE) 100 MG capsule TAKE (1) CAPSULE BY MOUTH TWICE DAILY FOR CONSTIPATION. (Patient taking differently: TAKE 100 MG BY MOUTH TWICE DAILY FOR CONSTIPATION.) 60 capsule 11  . ENSURE (ENSURE) USE 1 CAN BY MOUTH TWICE DAILY 2370 mL PRN  . fluticasone (FLONASE) 50 MCG/ACT nasal spray Place 2 sprays into both nostrils daily.    . mirtazapine (REMERON) 15 MG tablet TAKE 1 TABLET BY MOUTH DAILY 1 HOUR BEFORE BEDTIME. (Patient taking differently: TAKE 15 MG BY MOUTH DAILY 1 HOUR BEFORE BEDTIME.) 30 tablet 11  . oxybutynin (DITROPAN) 5 MG tablet TAKE 1 TABLET BY MOUTH EVERY MORNING. 30 tablet 11  . polyethylene glycol powder (GLYCOLAX/MIRALAX) powder MIX 17 GRAMS IN 8 OUNCES. OF WATER AND DRINK ONCE DAILY IN THE EVENING. 527 g 11  . potassium chloride (MICRO-K) 10 MEQ CR capsule Take 10 mEq  by mouth 2 (two) times daily.    . ranitidine (ZANTAC) 300 MG tablet TAKE 1 TABLET BY MOUTH DAILY AT BEDTIME FOR SEVERE REFLUX. 30 tablet 11  . risperiDONE (RISPERDAL) 1 MG tablet Take 1 mg by mouth daily.    . tamsulosin (FLOMAX) 0.4 MG CAPS capsule TAKE 1 CAPSULE BY MOUTH ONCE DAILY 30 MINUTES AFTER THE SAME MEAL EACH DAY. 30 capsule 11  . triamterene-hydrochlorothiazide (MAXZIDE-25) 37.5-25 MG tablet TAKE (1) TABLET BY MOUTH DAILY IN  THE MORNING FOR HIGH BLOOD PRESSURE. 30 tablet 11  . Vitamin D, Ergocalciferol, (DRISDOL) 50000 units CAPS capsule TAKE 1 CAPSULE BY MOUTH ONCE A MONTH ON THE 1ST. DO NOT CRUSH. 1 capsule 11   No current facility-administered medications on file prior to visit.     Review of Systems   Objective:   BP 136/72 (BP Location: Left Arm, Patient Position: Sitting, Cuff Size: Normal)   Pulse 82   Temp 98.8 F (37.1 C) (Temporal)   Resp 14   Ht 5\' 7"  (1.702 m)   Wt 149 lb (67.6 kg)   SpO2 95%   BMI 23.34 kg/m   Physical Exam  Constitutional: He is oriented to person, place, and time. He appears well-developed and well-nourished.  HENT:  Head: Normocephalic and atraumatic.  Eyes: Pupils are equal, round, and reactive to light. EOM are normal.  Neck: Normal range of motion. Neck supple. No thyromegaly present.  Cardiovascular: Normal rate, regular rhythm and normal heart sounds.  Pulmonary/Chest: Effort normal and breath sounds normal.  Musculoskeletal: He exhibits no edema.  Neurological: He is alert and oriented to person, place, and time. No cranial nerve deficit.  Skin: Skin is warm, dry and intact.  Psychiatric: He has a normal mood and affect. His behavior is normal.  Vitals reviewed.    Assessment and Plan  1. Pulmonary emphysema, unspecified emphysema type (HCC) Patient defers smoking cessation.  He does agree to consider trying to cut down on his smoking.  Denies any issues with his breathing at this point.  Will administer pneumonia shot  secondary to pathologic features of emphysema. Patient will need a CT scan follow-up of lung nodule in March 2020.  2. Need for vaccination Vaccination given - Pneumococcal conjugate vaccine 13-valent IM  3. Essential hypertension Continue medication as directed.  Check BMP at follow-up.  4. Tobacco abuse The 5 A's Model for treating Tobacco Use and Dependence was used today. I have identified and documented tobacco use status for this patient. I have urged the patient to quit tobacco use. At this time, the patient is unwilling and not ready to attempt to quit. I have provided patient with information regarding risks, cessation techniques, and interventions that might increase future attempts to quit smoking. I will plan on again addressing tobacco dependence at the next visit.  Discussed with group home worker that patient needs to be flossing his teeth at night.  He is noted to have very poor dentition and obvious plaque buildup.  Dental visit recommended. Keep routine visits with cardiology as recommended. Keep scheduled follow-up with hematology oncology as recommended. Continue psychiatric/medication management by psychiatrist. FL27for group home signed today. Office visit greater than 25 minutes.  Greater than 50% spent counseling and coordinating care. Return in about 3 months (around 08/26/2018). Aliene Beams, MD 05/26/2018

## 2018-06-11 DIAGNOSIS — F209 Schizophrenia, unspecified: Secondary | ICD-10-CM | POA: Diagnosis not present

## 2018-07-07 ENCOUNTER — Ambulatory Visit (INDEPENDENT_AMBULATORY_CARE_PROVIDER_SITE_OTHER): Payer: Medicare Other | Admitting: Nurse Practitioner

## 2018-07-07 ENCOUNTER — Encounter: Payer: Self-pay | Admitting: Nurse Practitioner

## 2018-07-07 VITALS — BP 158/93 | HR 72 | Temp 97.4°F | Ht 67.0 in | Wt 152.8 lb

## 2018-07-07 DIAGNOSIS — R634 Abnormal weight loss: Secondary | ICD-10-CM | POA: Diagnosis not present

## 2018-07-07 DIAGNOSIS — K5900 Constipation, unspecified: Secondary | ICD-10-CM

## 2018-07-07 DIAGNOSIS — K219 Gastro-esophageal reflux disease without esophagitis: Secondary | ICD-10-CM | POA: Diagnosis not present

## 2018-07-07 NOTE — Progress Notes (Signed)
Referring Provider: Aliene Beams, MD Primary Care Physician:  Aliene Beams, MD Primary GI:  [name]  Chief Complaint  Patient presents with  . Weight Loss    gaining a little weight, appetite is okay sometimes    HPI:   Kyle Wade is a 62 y.o. male who presents for follow-up on weight loss.  The patient was last seen in our office 01/06/2018 for GERD, constipation, weight loss.  He has impaired cognitive status (mentally challenged) and it is historically been difficult to obtain a complete history.  A HIDA scan was completed in 2017 that showed acute cholecystitis.  CTA also ordered which was negative for mesenteric ischemia.  Ultimately had a cholecystectomy 01/26/2016 by Dr. Lovell Sheehan.  He was referred to hematology for persistent leukopenia and felt this could be related to Thioridazine recommended close monitoring due to potential for significant agranulocytosis.  Colonoscopy and EGD up-to-date.  Weight stable from September 2018.  Weight noted to be fluctuating but within the same range from April 2018.  No other GI complaints.  He does enjoy Ensure Enfamil as his favorite flavor.  Recommend continue Dexilant, continue MiraLAX, follow-up in 6 months.  Today he is accompanied by a household friend. He is a difficult historian.Today he states he has a good appetite. He still enjoys Ensure, especially Vanilla and Strawberry. Denies abdominal pain, N/V, hematochezia, melena. GERD well managed with Dexilant. Constipation well managed still on MiraLAX. Denies any other upper or lower GI symptoms.  Objectively his last weight on 01/06/2018 was 143 pounds and today it is 152 pounds for a approximate 9 pound weight gain.   Past Medical History:  Diagnosis Date  . Acute cholecystitis 01/25/2016  . Anxiety   . BPH (benign prostatic hyperplasia)   . Chronic vomiting   . GERD (gastroesophageal reflux disease)   . Hypercholesteremia   . Hypertension   . Mental retardation   . OSA  (obstructive sleep apnea)    no cpap machine; could not tolerate.  . Schatzki's ring   . Schizophrenia [facility])     Past Surgical History:  Procedure Laterality Date  . BIOPSY  05/14/2017   Procedure: BIOPSY;  Surgeon: [name], MD;  [facility];  Service: Endoscopy;;  gastric polyp bx  . CHOLECYSTECTOMY N/A 01/26/2016   Procedure: LAPAROSCOPIC CHOLECYSTECTOMY;  Surgeon: [name], MD;  Location: [facility];  Service: General;  Laterality: N/A;  . COLONOSCOPY N/A 09/21/2014   [name]: right-sided diverticulosis, internal hemorrhoids. Repeat in 10 years.   . COLONOSCOPY WITH PROPOFOL N/A 05/14/2017   [name]: diverticulosis in ascending colon, otherwise normal  . ESOPHAGOGASTRODUODENOSCOPY N/A 09/21/2014   [name]: Subtle non-critical Schatzki's ring s/p 56 F dilation, query occult cervical esophageal web. Hiatal hernia. Question gastroparesis.   [name] ESOPHAGOGASTRODUODENOSCOPY (EGD) WITH PROPOFOL N/A 05/14/2017   [name]: normal esophagus s/p empiric dilation, benign gastric polyps, normal duodenum  . MALONEY DILATION N/A 09/21/2014   Procedure: Elease Hashimoto DILATION;  Surgeon: [name], MD;  [facility];  Service: Endoscopy;  Laterality: N/A;  . Elease Hashimoto DILATION N/A 05/14/2017   Procedure: Elease Hashimoto DILATION;  Surgeon: [name], MD;  [facility];  Service: Endoscopy;  Laterality: N/A;  . None    . SAVORY DILATION N/A 09/21/2014   Procedure: SAVORY DILATION;  Surgeon: [name], MD;  [facility];  Service: Endoscopy;  Laterality: N/A;  . TEE WITHOUT CARDIOVERSION N/A 02/02/2018   Procedure:  TRANSESOPHAGEAL ECHOCARDIOGRAM (TEE) WITH PROPOFOL;  Surgeon: [name], MD;  [facility];  Service: Cardiovascular;  Laterality: N/A;    Current Outpatient Medications  Medication Sig Dispense Refill  . ARIPiprazole (ABILIFY) 10 MG tablet Take 5 mg by mouth daily. Every morning    . ARIPiprazole (ABILIFY) 5 MG tablet Take  2.5 mg by mouth daily. With supper    . buPROPion (WELLBUTRIN SR) 150 MG 12 hr tablet Take 150 mg by mouth 2 (two) times daily.    . cyclobenzaprine (FLEXERIL) 10 MG tablet Take 10 mg by mouth 3 times/day as needed-between meals & bedtime for muscle spasms.    . cyclobenzaprine (FLEXERIL) 10 MG tablet TAKE 1 TABLET BY MOUTH AT BEDTIME FOR MUSCLE SPASMS 30 tablet 11  . DEXILANT 60 MG capsule TAKE 1 CAPSULE BY MOUTH ONCE A DAY. 30 capsule 11  . docusate sodium (COLACE) 100 MG capsule TAKE (1) CAPSULE BY MOUTH TWICE DAILY FOR CONSTIPATION. (Patient taking differently: TAKE 100 MG BY MOUTH TWICE DAILY FOR CONSTIPATION.) 60 capsule 11  . ENSURE (ENSURE) USE 1 CAN BY MOUTH TWICE DAILY 2370 mL PRN  . fluticasone (FLONASE) 50 MCG/ACT nasal spray Place 2 sprays into both nostrils daily.    . mirtazapine (REMERON) 15 MG tablet TAKE 1 TABLET BY MOUTH DAILY 1 HOUR BEFORE BEDTIME. (Patient taking differently: TAKE 15 MG BY MOUTH DAILY 1 HOUR BEFORE BEDTIME.) 30 tablet 11  . oxybutynin (DITROPAN) 5 MG tablet TAKE 1 TABLET BY MOUTH EVERY MORNING. 30 tablet 11  . polyethylene glycol powder (GLYCOLAX/MIRALAX) powder MIX 17 GRAMS IN 8 OUNCES. OF WATER AND DRINK ONCE DAILY IN THE EVENING. 527 g 11  . potassium chloride (K-DUR) 10 MEQ tablet TAKE 1 TABLET BY MOUTH TWICE DAILY. 60 tablet 11  . potassium chloride (MICRO-K) 10 MEQ CR capsule Take 10 mEq by mouth 2 (two) times daily.    . ranitidine (ZANTAC) 300 MG tablet TAKE 1 TABLET BY MOUTH DAILY AT BEDTIME FOR SEVERE REFLUX. 30 tablet 11  . risperiDONE (RISPERDAL) 1 MG tablet Take 1 mg by mouth daily.    . tamsulosin (FLOMAX) 0.4 MG CAPS capsule TAKE 1 CAPSULE BY MOUTH ONCE DAILY 30 MINUTES AFTER THE SAME MEAL EACH DAY. 30 capsule 11  . triamterene-hydrochlorothiazide (MAXZIDE-25) 37.5-25 MG tablet TAKE (1) TABLET BY MOUTH DAILY IN THE MORNING FOR HIGH BLOOD PRESSURE. 30 tablet 11  . Vitamin D, Ergocalciferol, (DRISDOL) 50000 units CAPS capsule TAKE 1 CAPSULE BY MOUTH  ONCE A MONTH ON THE 1ST. DO NOT CRUSH. 1 capsule 11   No current facility-administered medications for this visit.     Allergies as of 07/07/2018  . (No Known Allergies)    Family History  Problem Relation Age of Onset  . Colon cancer Unknown        unknown  . Hypertension Unknown   . Hyperlipidemia Unknown     Social History   Socioeconomic History  . Marital status: Single    Spouse name: Not on file  . Number of children: Not on file  . Years of education: Not on file  . Highest education level: Not on file  Occupational History  . Not on file  Social Needs  . Financial resource strain: Not on file  . Food insecurity:    Worry: Not on file    Inability: Not on file  . Transportation needs:    Medical: Not on file    Non-medical: Not on file  Tobacco Use  .  Smoking status: Current Every Day Smoker    Packs/day: 0.25    Years: 30.00    Pack years: 7.50    Types: Cigarettes  . Smokeless tobacco: Never Used  . Tobacco comment: 4 cigarettes a day  Substance and Sexual Activity  . Alcohol use: No  . Drug use: No  . Sexual activity: Never    Birth control/protection: None  Lifestyle  . Physical activity:    Days per week: Not on file    Minutes per session: Not on file  . Stress: Not on file  Relationships  . Social connections:    Talks on phone: Not on file    Gets together: Not on file    Attends religious service: Not on file    Active member of club or organization: Not on file    Attends meetings of clubs or organizations: Not on file    Relationship status: Not on file  Other Topics Concern  . Not on file  Social History Narrative  . Not on file    Review of Systems: General: Negative for anorexia, weight loss, fever, chills, fatigue, weakness. ENT: Negative for hoarseness, difficulty swallowing , nasal congestion. CV: Negative for chest pain, angina, palpitations, dyspnea on exertion, peripheral edema.  Respiratory: Negative for dyspnea at  rest, dyspnea on exertion, cough, sputum, wheezing.  GI: See history of present illness. Derm: Negative for rash or itching.  Endo: Negative for unusual weight change.  Heme: Negative for bruising or bleeding.   Physical Exam: BP (!) 158/93   Pulse 72   Temp (!) 97.4 F (36.3 C) (Oral)   Ht 5\' 7"  (1.702 m)   Wt 152 lb 12.8 oz (69.3 kg)   BMI 23.93 kg/m  General:   Alert and oriented. Pleasant and cooperative. Well-nourished and well-developed.  Eyes:  Without icterus, sclera clear and conjunctiva pink.  Ears:  Normal auditory acuity. Cardiovascular:  S1, S2 present withn 3/6 systolic murmur appreciated. Extremities without clubbing or edema. Respiratory:  Clear to auscultation bilaterally. No wheezes, rales, or rhonchi. No distress.  Gastrointestinal:  +BS, soft, non-tender and non-distended. No HSM noted. No guarding or rebound. No masses appreciated.  Rectal:  Deferred  Musculoskalatal:  Symmetrical without gross deformities. Neurologic:  Alert and oriented x4;  grossly normal neurologically. Psych:  Alert and cooperative. Normal mood and affect. Heme/Lymph/Immune: No excessive bruising noted.    07/07/2018 11:55 AM   Disclaimer: This note was dictated with voice recognition software. Similar sounding words can inadvertently be transcribed and may not be corrected upon review.

## 2018-07-07 NOTE — Progress Notes (Signed)
cc'ed to pcp °

## 2018-07-07 NOTE — Assessment & Plan Note (Signed)
Weight loss is doing quite well.  He has a good appetite and enjoys Ensure supplements.  He is actually gained weight since his last office visit.  Recommend he continue Ensure supplementation.  Continue with good appetite.  Follow-up in 6 months for weight loss.  If he continues to do well in 6 months we can likely drop back to once a year.

## 2018-07-07 NOTE — Patient Instructions (Signed)
1. Continue taking your current medications. 2. Return for follow-up in 6 months. 3. Call us if you have any questions or concerns.  At [facility] we value your feedback. You may receive a survey about your visit today. Please share your experience as we strive to create trusting relationships with our patients to provide genuine, compassionate, quality care.  It was great to meet you today!  I hope you have a great summer!!

## 2018-07-07 NOTE — Assessment & Plan Note (Addendum)
Constipation doing well on Colace and MiraLAX.  Recommend he continue this as needed.  Aloe up in 6 months and call if any worsening symptoms prior to follow-up.

## 2018-07-07 NOTE — Assessment & Plan Note (Signed)
GERD is currently well managed on PPI.  Recommend he continue Dexilant.  Follow-up in 6 months, call if any problems before then.

## 2018-07-17 ENCOUNTER — Telehealth: Payer: Self-pay | Admitting: Family Medicine

## 2018-07-17 NOTE — Telephone Encounter (Signed)
Patients guardians are requesting a prescription for his written order to smoke 4x a day. Cb#: 336/ [phone]

## 2018-07-20 NOTE — Telephone Encounter (Signed)
Would need to discuss at office visit. Janine Limbo. Tracie Harrier, MD

## 2018-07-21 NOTE — Telephone Encounter (Signed)
Spoke with Adair Laundry and let her know they need an appt to discuss prescription. She verbalized understanding. Will send front staff a message to call and schedule appt.

## 2018-08-11 ENCOUNTER — Ambulatory Visit (INDEPENDENT_AMBULATORY_CARE_PROVIDER_SITE_OTHER): Payer: Medicare Other | Admitting: Family Medicine

## 2018-08-11 ENCOUNTER — Encounter: Payer: Self-pay | Admitting: Family Medicine

## 2018-08-11 ENCOUNTER — Other Ambulatory Visit: Payer: Self-pay

## 2018-08-11 VITALS — BP 142/90 | HR 84 | Temp 98.7°F | Resp 12 | Ht 67.0 in | Wt 146.0 lb

## 2018-08-11 DIAGNOSIS — Z0000 Encounter for general adult medical examination without abnormal findings: Secondary | ICD-10-CM

## 2018-08-11 DIAGNOSIS — F209 Schizophrenia, unspecified: Secondary | ICD-10-CM | POA: Diagnosis not present

## 2018-08-11 DIAGNOSIS — I10 Essential (primary) hypertension: Secondary | ICD-10-CM | POA: Diagnosis not present

## 2018-08-11 DIAGNOSIS — E559 Vitamin D deficiency, unspecified: Secondary | ICD-10-CM | POA: Diagnosis not present

## 2018-08-11 DIAGNOSIS — R350 Frequency of micturition: Secondary | ICD-10-CM

## 2018-08-11 DIAGNOSIS — Z720 Tobacco use: Secondary | ICD-10-CM | POA: Diagnosis not present

## 2018-08-11 DIAGNOSIS — R634 Abnormal weight loss: Secondary | ICD-10-CM

## 2018-08-11 NOTE — Patient Instructions (Signed)
Urology referral placed Follow up with Cardiology You got your flu/influenza vaccine today  Keep follow up with oncology and gastroenterology

## 2018-08-11 NOTE — Progress Notes (Signed)
Patient ID: Kyle Wade, male    DOB: 25-Nov-1956, 62 y.o.   MRN: [id number]  Chief Complaint  Patient presents with  . Annual Exam    Allergies Patient has no known allergies.  Subjective:   Kyle Wade is a 62 y.o. male who presents to [facility] today.  HPI Kyle Wade presents today for a complete physical examination.  He is accompanied today by [name], 1 of the workers at his group home.  Kyle Wade lives at [facility].  His legal guardian is Kyle Wade with [facility].  Mild he has a history of severe mitral regurgitation, followed by cardiology.  He has a history of mental retardation and schizophrenia followed by [name] and psychiatry.  He is also followed by [name] and gastroenterology.  He has had a history of weight loss and has been monitored by GI for some time.  He is also being seen by [name] for neutropenia, thought most likely secondary to his psychiatric medications.  He does have scheduled follow-ups with these doctors.  Before switching to our office he was seen by [name] in [place], [place].  He has a history of some urinary issues and is on oxybutynin for overactive bladder but also on tamsulosin for his prostate. He came to our office on these medications but is never been evaluated by urology.  The group home worker that accompanies Kyle Wade today reports that he does have a good deal of urinary frequency and he has had for quite some time.   Kyle Wade does smoke cigarettes 4 times a day.  He is allowed four cigarette breaks each day.  He is able to tell me today that he does not wish to quit smoking but he does believe he should cut down.  The group home director, [name], asked for me to sign a statement saying that patient can smoke 4 times a day.  She reports this is the wish of his legal guardian.  She will provide this in writing to our office.  Kyle Wade tells me today he does not have any concerns.  He is not really able  to complete a review of systems today.  His group home worker does tell me that he has not been sick recently. Kyle Wade reports that he feels fine.    Past Medical History:  Diagnosis Date  . Acute cholecystitis 01/25/2016  . Anxiety   . BPH (benign prostatic hyperplasia)   . Chronic vomiting   . GERD (gastroesophageal reflux disease)   . Hypercholesteremia   . Hypertension   . Mental retardation   . OSA (obstructive sleep apnea)    no cpap machine; could not tolerate.  . Schatzki's ring   . Schizophrenia [place])     Past Surgical History:  Procedure Laterality Date  . BIOPSY  05/14/2017   Procedure: BIOPSY;  Surgeon: [name], MD;  [facility];  Service: Endoscopy;;  gastric polyp bx  . CHOLECYSTECTOMY N/A 01/26/2016   Procedure: LAPAROSCOPIC CHOLECYSTECTOMY;  Surgeon: [name], MD;  Location: [facility] ORS;  Service: General;  Laterality: N/A;  . COLONOSCOPY N/A 09/21/2014   [name]: right-sided diverticulosis, internal hemorrhoids. Repeat in 10 years.   . COLONOSCOPY WITH PROPOFOL N/A 05/14/2017   [name]: diverticulosis in ascending colon, otherwise normal  . ESOPHAGOGASTRODUODENOSCOPY N/A 09/21/2014   [name]: Subtle non-critical Schatzki's ring s/p 56 F dilation, query occult cervical esophageal web. Hiatal hernia. Question gastroparesis.   [name]  ESOPHAGOGASTRODUODENOSCOPY (EGD) WITH PROPOFOL N/A 05/14/2017   [name]: normal esophagus s/p empiric dilation, benign gastric polyps, normal duodenum  . MALONEY DILATION N/A 09/21/2014   Procedure: Elease Hashimoto DILATION;  Surgeon: [name], MD;  [facility];  Service: Endoscopy;  Laterality: N/A;  . Elease Hashimoto DILATION N/A 05/14/2017   Procedure: Elease Hashimoto DILATION;  Surgeon: [name], MD;  [facility];  Service: Endoscopy;  Laterality: N/A;  . None    . SAVORY DILATION N/A 09/21/2014   Procedure: SAVORY DILATION;  Surgeon: [name], MD;  [facility];  Service: Endoscopy;   Laterality: N/A;  . TEE WITHOUT CARDIOVERSION N/A 02/02/2018   Procedure: TRANSESOPHAGEAL ECHOCARDIOGRAM (TEE) WITH PROPOFOL;  Surgeon: [name], MD;  Location[facility];  Service: Cardiovascular;  Laterality: N/A;    Family History  Problem Relation Age of Onset  . Colon cancer Unknown        unknown  . Hypertension Unknown   . Hyperlipidemia Unknown      Social History   Socioeconomic History  . Marital status: Single    Spouse name: Not on file  . Number of children: Not on file  . Years of education: Not on file  . Highest education level: Not on file  Occupational History  . Not on file  Social Needs  . Financial resource strain: Not on file  . Food insecurity:    Worry: Not on file    Inability: Not on file  . Transportation needs:    Medical: Not on file    Non-medical: Not on file  Tobacco Use  . Smoking status: Current Every Day Smoker    Packs/day: 0.25    Years: 30.00    Pack years: 7.50    Types: Cigarettes  . Smokeless tobacco: Never Used  . Tobacco comment: 4 cigarettes a day  Substance and Sexual Activity  . Alcohol use: No  . Drug use: No  . Sexual activity: Never    Birth control/protection: None  Lifestyle  . Physical activity:    Days per week: Not on file    Minutes per session: Not on file  . Stress: Not on file  Relationships  . Social connections:    Talks on phone: Not on file    Gets together: Not on file    Attends religious service: Not on file    Active member of club or organization: Not on file    Attends meetings of clubs or organizations: Not on file    Relationship status: Not on file  Other Topics Concern  . Not on file  Social History Narrative   Lives in Group Home.    Smokes cigarettes qd.   Able to smoke cigarettes at 8, 12, 4, 8pm.    Seen by [name] in [facility].    Group Home [facility].      Legal guardian: [employer]rative [employer].    Current Outpatient Medications  on File Prior to Visit  Medication Sig Dispense Refill  . ARIPiprazole (ABILIFY) 10 MG tablet Take 5 mg by mouth daily. Every morning    . ARIPiprazole (ABILIFY) 5 MG tablet Take 2.5 mg by mouth daily. With supper    . buPROPion (WELLBUTRIN SR) 150 MG 12 hr tablet Take 150 mg by mouth 2 (two) times daily.    . cyclobenzaprine (FLEXERIL) 10 MG tablet Take 10 mg by mouth 3 times/day as needed-between meals & bedtime  for muscle spasms.    . cyclobenzaprine (FLEXERIL) 10 MG tablet TAKE 1 TABLET BY MOUTH AT BEDTIME FOR MUSCLE SPASMS 30 tablet 11  . DEXILANT 60 MG capsule TAKE 1 CAPSULE BY MOUTH ONCE A DAY. 30 capsule 11  . docusate sodium (COLACE) 100 MG capsule TAKE (1) CAPSULE BY MOUTH TWICE DAILY FOR CONSTIPATION. (Patient taking differently: TAKE 100 MG BY MOUTH TWICE DAILY FOR CONSTIPATION.) 60 capsule 11  . ENSURE (ENSURE) USE 1 CAN BY MOUTH TWICE DAILY 2370 mL PRN  . fluticasone (FLONASE) 50 MCG/ACT nasal spray Place 2 sprays into both nostrils daily.    . mirtazapine (REMERON) 15 MG tablet TAKE 1 TABLET BY MOUTH DAILY 1 HOUR BEFORE BEDTIME. (Patient taking differently: TAKE 15 MG BY MOUTH DAILY 1 HOUR BEFORE BEDTIME.) 30 tablet 11  . oxybutynin (DITROPAN) 5 MG tablet TAKE 1 TABLET BY MOUTH EVERY MORNING. 30 tablet 11  . polyethylene glycol powder (GLYCOLAX/MIRALAX) powder MIX 17 GRAMS IN 8 OUNCES. OF WATER AND DRINK ONCE DAILY IN THE EVENING. 527 g 11  . potassium chloride (K-DUR) 10 MEQ tablet TAKE 1 TABLET BY MOUTH TWICE DAILY. 60 tablet 11  . potassium chloride (MICRO-K) 10 MEQ CR capsule Take 10 mEq by mouth 2 (two) times daily.    . ranitidine (ZANTAC) 300 MG tablet TAKE 1 TABLET BY MOUTH DAILY AT BEDTIME FOR SEVERE REFLUX. 30 tablet 11  . risperiDONE (RISPERDAL) 1 MG tablet Take 1 mg by mouth daily.    . tamsulosin (FLOMAX) 0.4 MG CAPS capsule TAKE 1 CAPSULE BY MOUTH ONCE DAILY 30 MINUTES AFTER THE SAME MEAL EACH DAY. 30 capsule 11  . triamterene-hydrochlorothiazide (MAXZIDE-25) 37.5-25  MG tablet TAKE (1) TABLET BY MOUTH DAILY IN THE MORNING FOR HIGH BLOOD PRESSURE. 30 tablet 11  . Vitamin D, Ergocalciferol, (DRISDOL) 50000 units CAPS capsule TAKE 1 CAPSULE BY MOUTH ONCE A MONTH ON THE 1ST. DO NOT CRUSH. 1 capsule 11   No current facility-administered medications on file prior to visit.     Review of Systems  Unable to perform ROS: Psychiatric disorder     Objective:   BP (!) 142/90 (BP Location: Left Arm, Patient Position: Sitting, Cuff Size: Normal)   Pulse 84   Temp 98.7 F (37.1 C) (Temporal)   Resp 12   Ht 5\' 7"  (1.702 m)   Wt 146 lb 0.6 oz (66.2 kg)   SpO2 97% Comment: room air  BMI 22.87 kg/m   Physical Exam  Constitutional: He is oriented to person, place, and time. He appears well-developed and well-nourished. No distress.  HENT:  Head: Normocephalic and atraumatic.  Right Ear: External ear normal.  Left Ear: External ear normal.  Nose: Nose normal.  Mouth/Throat: Oropharynx is clear and moist. No oropharyngeal exudate.  Eyes: Pupils are equal, round, and reactive to light. Conjunctivae and EOM are normal. Right eye exhibits no discharge. No scleral icterus.  Neck: Normal range of motion. Neck supple. No JVD present. No tracheal deviation present. No thyromegaly present.  Cardiovascular: Normal rate and regular rhythm.  Murmur heard. Pulmonary/Chest: Effort normal and breath sounds normal. No stridor. No respiratory distress. He has no wheezes.  Abdominal: Soft. Bowel sounds are normal. He exhibits no distension. There is no tenderness. There is no guarding.  Musculoskeletal: He exhibits no edema.  Neurological: He is alert and oriented to person, place, and time. No cranial nerve deficit.  Skin: Skin is warm, dry and intact. Capillary refill takes less than 2 seconds. He is not diaphoretic.  No pallor.  Vitals reviewed.    Assessment and Plan  1. Well adult exam Routine health maintenance recommended. Dental visits every 6 months  recommended.  2. Urinary frequency Recommend referral to urology.  Patient is unable to give review of systems.  He defers prostate exam today.  Referral placed.  Continue oxybutynin and tamsulosin until evaluation by urology. - Ambulatory referral to Urology  3. Tobacco abuse The 5 A's Model for treating Tobacco Use and Dependence was used today. I have identified and documented tobacco use status for this patient. I have urged the patient to quit tobacco use. At this time, the patient is unwilling and not ready to attempt to quit. I have provided patient with information regarding risks, cessation techniques, and interventions that might increase future attempts to quit smoking. I will plan on again addressing tobacco dependence at the next visit. Did write on patient orders that he may smoke 4 times a day.  He does need to be supervised.  If there is another request which is documented by guardian I will write this order as required by the group home.  4. Schizophrenia, unspecified type (HCC) Keep scheduled visits with psychiatry.  On the patient's medication administration form I did note that psychiatric medications needed to be ordered by psychiatry not me.  5. Vitamin D deficiency Prescription vitamin D can be discontinued due to his normal vitamin D levels.  He may start an over-the-counter multivitamin.  6. Weight loss Chronic.  Has been evaluated by GI.  Also has dyspepsia and history of Schatzki's ring.  Keep scheduled visits with GI. 7. Essential hypertension/mitral regurgitation. Continue medications.  Keep scheduled visits with cardiology.  Need for cholesterol medication to be discussed with cardiology.  Acute visit secondary to severe mitral regurgitation. Paperwork was completed today.  Physical examination form completed.  Will scan copy into chart.  Was recommended patient establish care with new PCP and be evaluated within the next month.  Call with any questions or  concerns. No follow-ups on file. Aliene Beams, MD 08/11/2018

## 2018-08-19 ENCOUNTER — Telehealth: Payer: Self-pay | Admitting: Family Medicine

## 2018-08-19 NOTE — Telephone Encounter (Signed)
--  Adair Laundry [place]  [phone] regarding medication changes--

## 2018-08-20 NOTE — Telephone Encounter (Signed)
Called LaTonya to speak to her regarding Silvestre. No answer. Left a message requesting a call back.

## 2018-08-25 NOTE — Telephone Encounter (Signed)
Left message returning Kyle Wade's call. 2nd attempt

## 2018-09-01 NOTE — Telephone Encounter (Signed)
Called 3 x and got a busy signal each time. Will try again later.

## 2018-09-02 DIAGNOSIS — F209 Schizophrenia, unspecified: Secondary | ICD-10-CM | POA: Diagnosis not present

## 2018-09-02 NOTE — Telephone Encounter (Signed)
Have tried multiple times to reach Media at number left without success.

## 2018-09-22 ENCOUNTER — Encounter: Payer: Self-pay | Admitting: Cardiology

## 2018-09-22 ENCOUNTER — Ambulatory Visit (INDEPENDENT_AMBULATORY_CARE_PROVIDER_SITE_OTHER): Payer: Medicare Other | Admitting: Cardiology

## 2018-09-22 VITALS — BP 144/74 | HR 84 | Ht 69.0 in | Wt 145.0 lb

## 2018-09-22 DIAGNOSIS — I10 Essential (primary) hypertension: Secondary | ICD-10-CM | POA: Diagnosis not present

## 2018-09-22 DIAGNOSIS — I340 Nonrheumatic mitral (valve) insufficiency: Secondary | ICD-10-CM

## 2018-09-22 NOTE — Progress Notes (Signed)
Clinical Summary Kyle Wade is a 62 y.o.male seen today for follow up of the following medical problems.   1. MItral regurgitation/heart murmur - newly diagnosed heart murmur detected by pcp - echo Jan 2019 LVEF 60-65%, no WMAs, normal diastolic function, moderate to severe MR. LVIDs 19. The MV VTI/AV VTI of 1.5 would actually suggest severe MR - limited history available from patient due to limited mental ability - aid mentions SOB with walking just room to room at group home whichisnew for him.  - generalized fatigue at home.  - 01/2018 TEE that indicated moderate MR.  - poor historian. No significant symptoms from history I can gather from him and his caretakers.     2. Cognitive deficit/Mental handicap - has legal guardian  3. Schizophrenia - followed by pcp    ZO:[id number] Price with [place] [facility] [facility] is his guardian.   Past Medical History:  Diagnosis Date  . Acute cholecystitis 01/25/2016  . Anxiety   . BPH (benign prostatic hyperplasia)   . Chronic vomiting   . GERD (gastroesophageal reflux disease)   . Hypercholesteremia   . Hypertension   . Mental retardation   . OSA (obstructive sleep apnea)    no cpap machine; could not tolerate.  . Schatzki's ring   . Schizophrenia (HCC)      No Known Allergies   Current Outpatient Medications  Medication Sig Dispense Refill  . ARIPiprazole (ABILIFY) 10 MG tablet Take 5 mg by mouth daily. Every morning    . ARIPiprazole (ABILIFY) 5 MG tablet Take 2.5 mg by mouth daily. With supper    . buPROPion (WELLBUTRIN SR) 150 MG 12 hr tablet Take 150 mg by mouth 2 (two) times daily.    . cyclobenzaprine (FLEXERIL) 10 MG tablet Take 10 mg by mouth 3 times/day as needed-between meals & bedtime for muscle spasms.    . cyclobenzaprine (FLEXERIL) 10 MG tablet TAKE 1 TABLET BY MOUTH AT BEDTIME FOR MUSCLE SPASMS 30 tablet 11  . DEXILANT 60 MG capsule TAKE 1 CAPSULE BY MOUTH ONCE A DAY. 30 capsule 11  . docusate sodium  (COLACE) 100 MG capsule TAKE (1) CAPSULE BY MOUTH TWICE DAILY FOR CONSTIPATION. (Patient taking differently: TAKE 100 MG BY MOUTH TWICE DAILY FOR CONSTIPATION.) 60 capsule 11  . ENSURE (ENSURE) USE 1 CAN BY MOUTH TWICE DAILY 2370 mL PRN  . fluticasone (FLONASE) 50 MCG/ACT nasal spray Place 2 sprays into both nostrils daily.    . mirtazapine (REMERON) 15 MG tablet TAKE 1 TABLET BY MOUTH DAILY 1 HOUR BEFORE BEDTIME. (Patient taking differently: TAKE 15 MG BY MOUTH DAILY 1 HOUR BEFORE BEDTIME.) 30 tablet 11  . oxybutynin (DITROPAN) 5 MG tablet TAKE 1 TABLET BY MOUTH EVERY MORNING. 30 tablet 11  . polyethylene glycol powder (GLYCOLAX/MIRALAX) powder MIX 17 GRAMS IN 8 OUNCES. OF WATER AND DRINK ONCE DAILY IN THE EVENING. 527 g 11  . potassium chloride (K-DUR) 10 MEQ tablet TAKE 1 TABLET BY MOUTH TWICE DAILY. 60 tablet 11  . potassium chloride (MICRO-K) 10 MEQ CR capsule Take 10 mEq by mouth 2 (two) times daily.    . ranitidine (ZANTAC) 300 MG tablet TAKE 1 TABLET BY MOUTH DAILY AT BEDTIME FOR SEVERE REFLUX. 30 tablet 11  . risperiDONE (RISPERDAL) 1 MG tablet Take 1 mg by mouth daily.    . tamsulosin (FLOMAX) 0.4 MG CAPS capsule TAKE 1 CAPSULE BY MOUTH ONCE DAILY 30 MINUTES AFTER THE SAME MEAL EACH DAY. 30 capsule 11  . triamterene-hydrochlorothiazide (  MAXZIDE-25) 37.5-25 MG tablet TAKE (1) TABLET BY MOUTH DAILY IN THE MORNING FOR HIGH BLOOD PRESSURE. 30 tablet 11  . Vitamin D, Ergocalciferol, (DRISDOL) 50000 units CAPS capsule TAKE 1 CAPSULE BY MOUTH ONCE A MONTH ON THE 1ST. DO NOT CRUSH. 1 capsule 11   No current facility-administered medications for this visit.      Past Surgical History:  Procedure Laterality Date  . BIOPSY  05/14/2017   Procedure: BIOPSY;  Surgeon: [name], MD;  [facility];  Service: Endoscopy;;  gastric polyp bx  . CHOLECYSTECTOMY N/A 01/26/2016   Procedure: LAPAROSCOPIC CHOLECYSTECTOMY;  Surgeon: [name], MD;  Location: [facility];  Service: General;   Laterality: N/A;  . COLONOSCOPY N/A 09/21/2014   [name]: right-sided diverticulosis, internal hemorrhoids. Repeat in 10 years.   . COLONOSCOPY WITH PROPOFOL N/A 05/14/2017   [name]: diverticulosis in ascending colon, otherwise normal  . ESOPHAGOGASTRODUODENOSCOPY N/A 09/21/2014   [name]: Subtle non-critical Schatzki's ring s/p 56 F dilation, query occult cervical esophageal web. Hiatal hernia. Question gastroparesis.   [name] ESOPHAGOGASTRODUODENOSCOPY (EGD) WITH PROPOFOL N/A 05/14/2017   [name]: normal esophagus s/p empiric dilation, benign gastric polyps, normal duodenum  . MALONEY DILATION N/A 09/21/2014   Procedure: Elease Hashimoto DILATION;  Surgeon: [name], MD;  [facility];  Service: Endoscopy;  Laterality: N/A;  . Elease Hashimoto DILATION N/A 05/14/2017   Procedure: Elease Hashimoto DILATION;  Surgeon: [name], MD;  [facility];  Service: Endoscopy;  Laterality: N/A;  . None    . SAVORY DILATION N/A 09/21/2014   Procedure: SAVORY DILATION;  Surgeon: [name], MD;  [facility];  Service: Endoscopy;  Laterality: N/A;  . TEE WITHOUT CARDIOVERSION N/A 02/02/2018   Procedure: TRANSESOPHAGEAL ECHOCARDIOGRAM (TEE) WITH PROPOFOL;  Surgeon: [name], MD;  [facility];  Service: Cardiovascular;  Laterality: N/A;     No Known Allergies    Family History  Problem Relation Age of Onset  . Colon cancer Unknown        unknown  . Hypertension Unknown   . Hyperlipidemia Unknown      Social History Mr. Repinski reports that he has been smoking cigarettes. He has a 7.50 pack-year smoking history. He has never used smokeless tobacco. Mr. Beckner reports that he does not drink alcohol.   Review of Systems CONSTITUTIONAL: No weight loss, fever, chills, weakness or fatigue.  HEENT: Eyes: No visual loss, blurred vision, double vision or yellow sclerae.No hearing loss, sneezing, congestion, runny nose or sore throat.  SKIN: No rash or itching.    CARDIOVASCULAR: per hpi RESPIRATORY: No shortness of breath, cough or sputum.  GASTROINTESTINAL: No anorexia, nausea, vomiting or diarrhea. No abdominal pain or blood.  GENITOURINARY: No burning on urination, no polyuria NEUROLOGICAL: No headache, dizziness, syncope, paralysis, ataxia, numbness or tingling in the extremities. No change in bowel or bladder control.  MUSCULOSKELETAL: No muscle, back pain, joint pain or stiffness.  LYMPHATICS: No enlarged nodes. No history of splenectomy.  PSYCHIATRIC: No history of depression or anxiety.  ENDOCRINOLOGIC: No reports of sweating, cold or heat intolerance. No polyuria or polydipsia.  [name]   Physical Examination Vitals:   09/22/18 1439  BP: (!) 144/74  Pulse: 84  SpO2: 95%   Vitals:   09/22/18 1439  Weight: 145 lb (65.8 kg)  Height: 5\' 9"  (1.753 m)    Gen: resting comfortably, no acute distress HEENT: no scleral icterus, pupils equal round and reactive,  no palptable cervical adenopathy,  CV: RRR, 3/6 systolic murmur rusb, no jvd Resp: Clear to auscultation bilaterally GI: abdomen is soft, non-tender, non-distended, normal bowel sounds, no hepatosplenomegaly MSK: extremities are warm, no edema.  Skin: warm, no rash Neuro:  no focal deficits Psych: appropriate affect   Assessment and Plan   1. Mitral regurgitation - combined TTE and TEE data would support moderate to severe MR. The MR jet is very eccentric and difficult to evaluate - he has normal LVEF, no evidence of chamber enlargement - his symptoms are difficult to assess due to his mental handicap, from what I can gather from he and his caretaker he is not having any symptoms.  - repeat surveillance echo.   2. HTN - manual recheck 130/75, continue current meds   Antoine Poche, M.D.

## 2018-09-22 NOTE — Patient Instructions (Addendum)
Medication Instructions:  Your physician recommends that you continue on your current medications as directed. Please refer to the Current Medication list given to you today.   Labwork: none  Testing/Procedures: Your physician has requested that you have an echocardiogram. Echocardiography is a painless test that uses sound waves to create images of your heart. It provides your doctor with information about the size and shape of your heart and how well your heart's chambers and valves are working. This procedure takes approximately one hour. There are no restrictions for this procedure.    Follow-Up: Your physician recommends that you schedule a follow-up appointment in: pending echo results   Any Other Special Instructions Will Be Listed Below (If Applicable).     If you need a refill on your cardiac medications before your next appointment, please call your pharmacy.   

## 2018-09-24 ENCOUNTER — Ambulatory Visit (HOSPITAL_COMMUNITY): Admission: RE | Admit: 2018-09-24 | Payer: Medicare Other | Source: Ambulatory Visit

## 2018-09-29 ENCOUNTER — Ambulatory Visit (HOSPITAL_COMMUNITY): Payer: Medicare Other | Attending: Cardiology

## 2018-10-06 ENCOUNTER — Other Ambulatory Visit: Payer: Self-pay | Admitting: Gastroenterology

## 2018-11-24 ENCOUNTER — Other Ambulatory Visit (HOSPITAL_COMMUNITY): Payer: Medicare Other

## 2018-11-24 ENCOUNTER — Inpatient Hospital Stay (HOSPITAL_COMMUNITY): Payer: Medicare Other | Attending: Hematology

## 2018-11-24 DIAGNOSIS — F79 Unspecified intellectual disabilities: Secondary | ICD-10-CM | POA: Diagnosis not present

## 2018-11-24 DIAGNOSIS — G4700 Insomnia, unspecified: Secondary | ICD-10-CM | POA: Diagnosis not present

## 2018-11-24 DIAGNOSIS — Z800 Family history of malignant neoplasm of digestive organs: Secondary | ICD-10-CM | POA: Insufficient documentation

## 2018-11-24 DIAGNOSIS — I10 Essential (primary) hypertension: Secondary | ICD-10-CM | POA: Insufficient documentation

## 2018-11-24 DIAGNOSIS — E7800 Pure hypercholesterolemia, unspecified: Secondary | ICD-10-CM | POA: Insufficient documentation

## 2018-11-24 DIAGNOSIS — N400 Enlarged prostate without lower urinary tract symptoms: Secondary | ICD-10-CM | POA: Insufficient documentation

## 2018-11-24 DIAGNOSIS — F17210 Nicotine dependence, cigarettes, uncomplicated: Secondary | ICD-10-CM | POA: Insufficient documentation

## 2018-11-24 DIAGNOSIS — D72819 Decreased white blood cell count, unspecified: Secondary | ICD-10-CM | POA: Insufficient documentation

## 2018-11-24 DIAGNOSIS — Z79899 Other long term (current) drug therapy: Secondary | ICD-10-CM | POA: Insufficient documentation

## 2018-11-24 DIAGNOSIS — F209 Schizophrenia, unspecified: Secondary | ICD-10-CM | POA: Diagnosis not present

## 2018-11-24 DIAGNOSIS — F419 Anxiety disorder, unspecified: Secondary | ICD-10-CM | POA: Diagnosis not present

## 2018-11-24 DIAGNOSIS — K219 Gastro-esophageal reflux disease without esophagitis: Secondary | ICD-10-CM | POA: Diagnosis not present

## 2018-11-24 LAB — VITAMIN B12: VITAMIN B 12: 457 pg/mL (ref 180–914)

## 2018-11-24 LAB — CBC WITH DIFFERENTIAL/PLATELET
ABS IMMATURE GRANULOCYTES: 0.02 10*3/uL (ref 0.00–0.07)
BASOS ABS: 0 10*3/uL (ref 0.0–0.1)
Basophils Relative: 0 %
EOS ABS: 0.1 10*3/uL (ref 0.0–0.5)
Eosinophils Relative: 3 %
HEMATOCRIT: 46.5 % (ref 39.0–52.0)
HEMOGLOBIN: 15.2 g/dL (ref 13.0–17.0)
Immature Granulocytes: 1 %
LYMPHS PCT: 37 %
Lymphs Abs: 1.5 10*3/uL (ref 0.7–4.0)
MCH: 30.2 pg (ref 26.0–34.0)
MCHC: 32.7 g/dL (ref 30.0–36.0)
MCV: 92.3 fL (ref 80.0–100.0)
MONOS PCT: 10 %
Monocytes Absolute: 0.4 10*3/uL (ref 0.1–1.0)
NEUTROS ABS: 1.9 10*3/uL (ref 1.7–7.7)
NRBC: 0 % (ref 0.0–0.2)
Neutrophils Relative %: 49 %
Platelets: 149 10*3/uL — ABNORMAL LOW (ref 150–400)
RBC: 5.04 MIL/uL (ref 4.22–5.81)
RDW: 12.4 % (ref 11.5–15.5)
WBC: 3.9 10*3/uL — ABNORMAL LOW (ref 4.0–10.5)

## 2018-11-24 LAB — FOLATE: FOLATE: 14.5 ng/mL (ref >5.9)

## 2018-11-24 LAB — LACTATE DEHYDROGENASE: LDH: 186 U/L (ref 98–192)

## 2018-11-25 ENCOUNTER — Other Ambulatory Visit (HOSPITAL_COMMUNITY): Payer: Medicare Other

## 2018-11-25 DIAGNOSIS — B351 Tinea unguium: Secondary | ICD-10-CM | POA: Diagnosis not present

## 2018-11-25 DIAGNOSIS — L110 Acquired keratosis follicularis: Secondary | ICD-10-CM | POA: Diagnosis not present

## 2018-11-25 DIAGNOSIS — I739 Peripheral vascular disease, unspecified: Secondary | ICD-10-CM | POA: Diagnosis not present

## 2018-12-01 ENCOUNTER — Other Ambulatory Visit: Payer: Self-pay

## 2018-12-01 ENCOUNTER — Encounter (HOSPITAL_COMMUNITY): Payer: Self-pay | Admitting: Hematology

## 2018-12-01 ENCOUNTER — Inpatient Hospital Stay (HOSPITAL_BASED_OUTPATIENT_CLINIC_OR_DEPARTMENT_OTHER): Payer: Medicare Other | Admitting: Hematology

## 2018-12-01 VITALS — BP 139/64 | HR 79 | Temp 98.4°F | Resp 16 | Wt 145.5 lb

## 2018-12-01 DIAGNOSIS — D72819 Decreased white blood cell count, unspecified: Secondary | ICD-10-CM

## 2018-12-01 DIAGNOSIS — N400 Enlarged prostate without lower urinary tract symptoms: Secondary | ICD-10-CM | POA: Diagnosis not present

## 2018-12-01 DIAGNOSIS — F209 Schizophrenia, unspecified: Secondary | ICD-10-CM

## 2018-12-01 DIAGNOSIS — I10 Essential (primary) hypertension: Secondary | ICD-10-CM | POA: Diagnosis not present

## 2018-12-01 DIAGNOSIS — Z800 Family history of malignant neoplasm of digestive organs: Secondary | ICD-10-CM

## 2018-12-01 DIAGNOSIS — Z79899 Other long term (current) drug therapy: Secondary | ICD-10-CM

## 2018-12-01 DIAGNOSIS — K219 Gastro-esophageal reflux disease without esophagitis: Secondary | ICD-10-CM

## 2018-12-01 DIAGNOSIS — F79 Unspecified intellectual disabilities: Secondary | ICD-10-CM

## 2018-12-01 DIAGNOSIS — F17210 Nicotine dependence, cigarettes, uncomplicated: Secondary | ICD-10-CM | POA: Diagnosis not present

## 2018-12-01 DIAGNOSIS — E7800 Pure hypercholesterolemia, unspecified: Secondary | ICD-10-CM

## 2018-12-01 DIAGNOSIS — G4700 Insomnia, unspecified: Secondary | ICD-10-CM

## 2018-12-01 DIAGNOSIS — F419 Anxiety disorder, unspecified: Secondary | ICD-10-CM | POA: Diagnosis not present

## 2018-12-01 DIAGNOSIS — D702 Other drug-induced agranulocytosis: Secondary | ICD-10-CM | POA: Diagnosis not present

## 2018-12-01 NOTE — Assessment & Plan Note (Signed)
1.  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  Both thioridazine and mirtazapine are known to cause mild leukopenia and thrombocytopenia. -He denies any fevers, night sweats or weight loss in the last 6 months.  Denies any recurrent infections or hospitalizations. - His white count today improved to 3.9.  Platelet count is normal.  ANC was also within normal limits.  No palpable adenopathy or splenomegaly on examination. - We will monitor him by repeating his blood counts at next visit.  I will spread out his follow-up visit to 1 year.

## 2018-12-01 NOTE — Patient Instructions (Signed)
[facility] at [facility] Discharge Instructions  Follow up in 1 year with labs    Thank you for choosing [facility] at [facility] to provide your oncology and hematology care.  To afford each patient quality time with our provider, please arrive at least 15 minutes before your scheduled appointment time.   If you have a lab appointment with the Cancer Center please come in thru the  Main Entrance and check in at the main information desk  You need to re-schedule your appointment should you arrive 10 or more minutes late.  We strive to give you quality time with our providers, and arriving late affects you and other patients whose appointments are after yours.  Also, if you no show three or more times for appointments you may be dismissed from the clinic at the providers discretion.     Again, thank you for choosing [facility].  Our hope is that these requests will decrease the amount of time that you wait before being seen by our physicians.       _____________________________________________________________  Should you have questions after your visit to [facility], please contact our office at [phone] between the hours of 8:00 a.m. and 4:30 p.m.  Voicemails left after 4:00 p.m. will not be returned until the following business day.  For prescription refill requests, have your pharmacy contact our office and allow 72 hours.    Cancer Center Support Programs:   > Cancer Support Group  2nd Tuesday of the month 1pm-2pm, Journey Room    

## 2018-12-01 NOTE — Progress Notes (Signed)
Kyle [address], [place] [address]   CLINIC:  Medical Oncology/Hematology  PCP:  Patient, No Pcp Per No address on file None   REASON FOR VISIT:  Follow-up for leukopenia and thrombocytopenia.  CURRENT THERAPY: Observation   INTERVAL HISTORY:  Kyle Wade 62 y.o. male returns for routine follow-up for leukopenia and thrombocytopenia. He is here today with his caregiver. He lives in a group home here in [place]. He has no complaints at this time. He does speak very much but the caregiver reports he has been doing well. He drinks 2 cans of ensure daily. His appetite is 100%. His energy level is 75%. He denies any new pains. Denies any fevers or recent infections. Denies any bleeding or easy bruising. Denies any GI issue.     REVIEW OF SYSTEMS:  Review of Systems  All other systems reviewed and are negative.    PAST MEDICAL/SURGICAL HISTORY:  Past Medical History:  Diagnosis Date  . Acute cholecystitis 01/25/2016  . Anxiety   . BPH (benign prostatic hyperplasia)   . Chronic vomiting   . GERD (gastroesophageal reflux disease)   . Hypercholesteremia   . Hypertension   . Mental retardation   . OSA (obstructive sleep apnea)    no cpap machine; could not tolerate.  . Schatzki's ring   . Schizophrenia [facility])    Past Surgical History:  Procedure Laterality Date  . BIOPSY  05/14/2017   Procedure: BIOPSY;  Surgeon: [name], MD;  [facility];  Service: Endoscopy;;  gastric polyp bx  . CHOLECYSTECTOMY N/A 01/26/2016   Procedure: LAPAROSCOPIC CHOLECYSTECTOMY;  Surgeon: [name], MD;  Location: [facility] OR[facility];  Service: General;  Laterality: N/A;  . COLONOSCOPY N/A 09/21/2014   [name]: right-sided diverticulosis, internal hemorrhoids. Repeat in 10 years.   . COLONOSCOPY WITH PROPOFOL N/A 05/14/2017   [name]: diverticulosis in ascending colon, otherwise normal  . ESOPHAGOGASTRODUODENOSCOPY N/A 09/21/2014   [name]: Subtle  non-critical Schatzki's ring s/p 56 F dilation, query occult cervical esophageal web. Hiatal hernia. Question gastroparesis.   [name] ESOPHAGOGASTRODUODENOSCOPY (EGD) WITH PROPOFOL N/A 05/14/2017   [name]: normal esophagus s/p empiric dilation, benign gastric polyps, normal duodenum  . MALONEY DILATION N/A 09/21/2014   Procedure: Elease Hashimoto DILATION;  Surgeon: [name], MD;  [facility];  Service: Endoscopy;  Laterality: N/A;  . Elease Hashimoto DILATION N/A 05/14/2017   Procedure: Elease Hashimoto DILATION;  Surgeon: [name], MD;  [facility];  Service: Endoscopy;  Laterality: N/A;  . None    . SAVORY DILATION N/A 09/21/2014   Procedure: SAVORY DILATION;  Surgeon: [name], MD;  [facility];  Service: Endoscopy;  Laterality: N/A;  . TEE WITHOUT CARDIOVERSION N/A 02/02/2018   Procedure: TRANSESOPHAGEAL ECHOCARDIOGRAM (TEE) WITH PROPOFOL;  Surgeon: [name], MD;  [facility];  Service: Cardiovascular;  Laterality: N/A;     SOCIAL HISTORY:  Social History   Socioeconomic History  . Marital status: Single    Spouse name: Not on file  . Number of children: Not on file  . Years of education: Not on file  . Highest education level: Not on file  Occupational History  . Not on file  Social Needs  . Financial resource strain: Not on file  . Food insecurity:    Worry: Not on file    Inability: Not on file  . Transportation needs:    Medical: Not on file  Non-medical: Not on file  Tobacco Use  . Smoking status: Current Every Day Smoker    Packs/day: 0.25    Years: 30.00    Pack years: 7.50    Types: Cigarettes  . Smokeless tobacco: Never Used  . Tobacco comment: 4 cigarettes a day  Substance and Sexual Activity  . Alcohol use: No  . Drug use: No  . Sexual activity: Never    Birth control/protection: None  Lifestyle  . Physical activity:    Days per week: Not on file    Minutes per session: Not on file  . Stress: Not on file    Relationships  . Social connections:    Talks on phone: Not on file    Gets together: Not on file    Attends religious service: Not on file    Active member of club or organization: Not on file    Attends meetings of clubs or organizations: Not on file    Relationship status: Not on file  . Intimate partner violence:    Fear of current or ex partner: Not on file    Emotionally abused: Not on file    Physically abused: Not on file    Forced sexual activity: Not on file  Other Topics Concern  . Not on file  Social History Narrative   Lives in Group Home.    Smokes cigarettes qd.   Able to smoke cigarettes at 8, 12, 4, 8pm.    Seen by [name] in [facility].    [facility].      Legal guardian: Armed forces logistics/support/administrative officer Department of Social services.     FAMILY HISTORY:  Family History  Problem Relation Age of Onset  . Colon cancer Unknown        unknown  . Hypertension Unknown   . Hyperlipidemia Unknown     CURRENT MEDICATIONS:  Outpatient Encounter Medications as of 12/01/2018  Medication Sig  . ARIPiprazole (ABILIFY) 10 MG tablet Take 5 mg by mouth daily. Every morning  . ARIPiprazole (ABILIFY) 5 MG tablet Take 2.5 mg by mouth daily. With supper  . buPROPion (WELLBUTRIN SR) 150 MG 12 hr tablet Take 150 mg by mouth 2 (two) times daily.  [name] DEXILANT 60 MG capsule TAKE 1 CAPSULE BY MOUTH ONCE A DAY.  [name] docusate sodium (COLACE) 100 MG capsule TAKE (1) CAPSULE BY MOUTH TWICE DAILY FOR CONSTIPATION. (Patient taking differently: TAKE 100 MG BY MOUTH TWICE DAILY FOR CONSTIPATION.)  . ENSURE (ENSURE) USE 1 CAN BY MOUTH TWICE DAILY  . fluticasone (FLONASE) 50 MCG/ACT nasal spray Place 2 sprays into both nostrils daily.  . mirtazapine (REMERON) 15 MG tablet TAKE 1 TABLET BY MOUTH DAILY 1 HOUR BEFORE BEDTIME. (Patient taking differently: TAKE 15 MG BY MOUTH DAILY 1 HOUR BEFORE BEDTIME.)  . oxybutynin (DITROPAN) 5 MG tablet TAKE 1 TABLET BY MOUTH EVERY MORNING.  [name]  polyethylene glycol powder (GLYCOLAX/MIRALAX) powder MIX 17 GRAMS IN 8 OUNCES. OF WATER AND DRINK ONCE DAILY IN THE EVENING.  [name] potassium chloride (MICRO-K) 10 MEQ CR capsule Take 10 mEq by mouth 2 (two) times daily.  . ranitidine (ZANTAC) 300 MG tablet TAKE 1 TABLET BY MOUTH DAILY AT BEDTIME FOR SEVERE REFLUX.  [name] risperiDONE (RISPERDAL) 1 MG tablet Take 1 mg by mouth daily.  . tamsulosin (FLOMAX) 0.4 MG CAPS capsule TAKE 1 CAPSULE BY MOUTH ONCE DAILY 30 MINUTES AFTER THE SAME MEAL EACH DAY.  [name] triamterene-hydrochlorothiazide (MAXZIDE-25) 37.5-25 MG tablet TAKE (1) TABLET BY  MOUTH DAILY IN THE MORNING FOR HIGH BLOOD PRESSURE.  . Vitamin D, Ergocalciferol, (DRISDOL) 50000 units CAPS capsule TAKE 1 CAPSULE BY MOUTH ONCE A MONTH ON THE 1ST. DO NOT CRUSH.  . [DISCONTINUED] cyclobenzaprine (FLEXERIL) 10 MG tablet TAKE 1 TABLET BY MOUTH AT BEDTIME FOR MUSCLE SPASMS   No facility-administered encounter medications on file as of 12/01/2018.     ALLERGIES:  No Known Allergies   PHYSICAL EXAM:  ECOG Performance status: 1  Vitals:   12/01/18 1452  BP: 139/64  Pulse: 79  Resp: 16  Temp: 98.4 F (36.9 C)  SpO2: 100%   Filed Weights   12/01/18 1452  Weight: 145 lb 8 oz (66 kg)    Physical Exam Constitutional:      Appearance: Normal appearance. He is normal weight.  Musculoskeletal: Normal range of motion.  Skin:    General: Skin is warm and dry.  Neurological:     Mental Status: He is alert. Mental status is at baseline.  Psychiatric:        Mood and Affect: Mood normal.        Behavior: Behavior normal.        Thought Content: Thought content normal.        Judgment: Judgment normal.      LABORATORY DATA:  I have reviewed the labs as listed.  CBC    Component Value Date/Time   WBC 3.9 (L) 11/24/2018 1250   RBC 5.04 11/24/2018 1250   HGB 15.2 11/24/2018 1250   HCT 46.5 11/24/2018 1250   HCT 44.3 10/30/2016 1143   PLT 149 (L) 11/24/2018 1250   MCV 92.3 11/24/2018 1250     MCH 30.2 11/24/2018 1250   MCHC 32.7 11/24/2018 1250   RDW 12.4 11/24/2018 1250   LYMPHSABS 1.5 11/24/2018 1250   MONOABS 0.4 11/24/2018 1250   EOSABS 0.1 11/24/2018 1250   BASOSABS 0.0 11/24/2018 1250   CMP Latest Ref Rng & Units 05/14/2018 04/08/2018 04/07/2018  Glucose 65 - 99 mg/dL 161(W) 74 -  BUN 6 - 20 mg/dL 13 9 -  Creatinine 9.60 - 1.24 mg/dL 4.54 0.98 -  Sodium 119 - 145 mmol/L 133(L) 139 -  Potassium 3.5 - 5.1 mmol/L 3.5 4.1 -  Chloride 101 - 111 mmol/L 98(L) 101 -  CO2 22 - 32 mmol/L 28 34(H) -  Calcium 8.9 - 10.3 mg/dL 9.0 9.5 -  Total Protein 6.5 - 8.1 g/dL 7.0 7.1 7.4  Total Bilirubin 0.3 - 1.2 mg/dL 0.6 0.5 0.9  Alkaline Phos 38 - 126 U/L 72 - 80  AST 15 - 41 U/L 20 19 24   ALT 17 - 63 U/L 16(L) 16 20       DIAGNOSTIC IMAGING:  I have independently reviewed the scans and discussed with the patient.   I have reviewed Mathis Bud, NP's note and agree with the documentation.  I personally performed a face-to-face visit, made revisions and my assessment and plan is as follows.    ASSESSMENT & PLAN:   Neutropenia (HCC) 1.  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  Both thioridazine and mirtazapine are known to cause mild leukopenia and thrombocytopenia. -He denies any fevers, night sweats or weight loss in the last 6 months.  Denies any recurrent infections or hospitalizations. - His white count today improved to 3.9.  Platelet count is normal.  ANC was also within normal limits.  No palpable adenopathy or splenomegaly on examination. - We will  monitor him by repeating his blood counts at next visit.  I will spread out his follow-up visit to 1 year.      Orders placed this encounter:  Orders Placed This Encounter  Procedures  . CBC with Differential/Platelet  . Comprehensive metabolic panel  . Vitamin B12  . Folate  . Lactate dehydrogenase      Doreatha Massed, MD [facility] [phone]

## 2018-12-11 ENCOUNTER — Other Ambulatory Visit: Payer: Self-pay | Admitting: Internal Medicine

## 2019-01-06 ENCOUNTER — Encounter: Payer: Self-pay | Admitting: Nurse Practitioner

## 2019-01-06 ENCOUNTER — Ambulatory Visit (INDEPENDENT_AMBULATORY_CARE_PROVIDER_SITE_OTHER): Payer: Medicare Other | Admitting: Nurse Practitioner

## 2019-01-06 VITALS — BP 154/87 | HR 70 | Temp 97.1°F | Ht 66.0 in | Wt 144.2 lb

## 2019-01-06 DIAGNOSIS — K219 Gastro-esophageal reflux disease without esophagitis: Secondary | ICD-10-CM

## 2019-01-06 DIAGNOSIS — R634 Abnormal weight loss: Secondary | ICD-10-CM

## 2019-01-06 DIAGNOSIS — K5900 Constipation, unspecified: Secondary | ICD-10-CM

## 2019-01-06 NOTE — Assessment & Plan Note (Signed)
Constipation generally well controlled on daily MiraLAX and daily Colace.  Occasionally he will have a day where he has straining.  I recommended he add a second dose of MiraLAX to his regimen that day to help for breakthrough constipation symptoms.  Otherwise he feels his bowel movements are doing well.  Return for follow-up in 1 year.

## 2019-01-06 NOTE — Patient Instructions (Signed)
Your health issues we discussed today were:   Heartburn (GERD): 1. Continue using Dexilant daily 2. You can use Tums as needed for "breakthrough "heartburn  Constipation: 1. Continue daily Colace and MiraLAX 2. You can take a second dose of MiraLAX, as needed, for days that she may have straining or worsening constipation  Overall I recommend:  1. Return for follow-up in 1 year 2. Call us if you have any questions or concerns.  At [facility] we value your feedback. You may receive a survey about your visit today. Please share your experience as we strive to create trusting relationships with our patients to provide genuine, compassionate, quality care.  We appreciate your understanding and patience as we review any laboratory studies, imaging, and other diagnostic tests that are ordered as we care for you. Our office policy is 5 business days for review of these results, and any emergent or urgent results are addressed in a timely manner for your best interest. If you do not hear from our office in 1 week, please contact us.   We also encourage the use of MyChart, which contains your medical information for your review as well. If you are not enrolled in this feature, an access code is on this after visit summary for your convenience. Thank you for allowing [place] to be involved in your care.  It was great to see you today!  I hope you have a great day!!

## 2019-01-06 NOTE — Progress Notes (Signed)
Referring Provider: Aliene BeamsHagler, Rachel, MD Primary Care Physician:  Salley SlaughterBoone, Angela, NP Primary GI:  Dr. Jena Gaussourk  Chief Complaint  Patient presents with  . Gastroesophageal Reflux    f/u. Doing okay  . Weight Loss    f/u  . Constipation    F/u. BM's are 2-3 days    HPI:   Kyle Wade is a 63 y.o. male who presents for follow-up on GERD, weight loss, constipation.  The patient was last seen in our office 07/07/2018 for the same.  History of impaired cognitive status (mentally challenged) and historically difficult to obtain a complete history.  HIDA scan 2017 showed acute cholecystitis.  CTA around that time also negative for mesenteric ischemia.  Underwent cholecystectomy in 2017 by Dr. Lovell SheehanJenkins.  Persistent leukopenia with hematology follow-up which felt likely related to Thioridazine and recommended close monitoring due to potential significant agranulocytosis.  Colonoscopy and EGD up-to-date.  At last visit he was accompanied by household friend.  Noted to be difficult historian but stated he had a good appetite.  Still enjoys Ensure, especially vanilla and strawberry.  GERD well managed with Dexilant and constipation well managed on MiraLAX.  No other GI symptoms.  Noted 9 pound weight gain in the previous 6 months.  Today he is accompanied by household friend.  Today he states he has rare/occasional GERD symptoms. Has a bowel movement every 2-3 days, typically soft and passes easily; sometimes has straining. Denies hematochezia, fever, N/V. Has intermittent abdominal pain when he hasn't eaten, states it "feels like I need to eat" and typically resolves when he eats. Currently on Dexilant, Colace and MiraLAX. Denies chest pain, dyspnea, dizziness, lightheadedness, syncope, near syncope. Denies any other upper or lower GI symptoms.  Past Medical History:  Diagnosis Date  . Acute cholecystitis 01/25/2016  . Anxiety   . BPH (benign prostatic hyperplasia)   . Chronic vomiting   . GERD  (gastroesophageal reflux disease)   . Hypercholesteremia   . Hypertension   . Mental retardation   . OSA (obstructive sleep apnea)    no cpap machine; could not tolerate.  . Schatzki's ring   . Schizophrenia [facility](HCC)     Past Surgical History:  Procedure Laterality Date  . BIOPSY  05/14/2017   Procedure: BIOPSY;  Surgeon: Corbin Adeourk, Robert M, MD;  [facility];  Service: Endoscopy;;  gastric polyp bx  . CHOLECYSTECTOMY N/A 01/26/2016   Procedure: LAPAROSCOPIC CHOLECYSTECTOMY;  Surgeon: Franky MachoMark Jenkins, MD;  Location: [facility];  Service: General;  Laterality: N/A;  . COLONOSCOPY N/A 09/21/2014   Dr. Jena Gaussourk: right-sided diverticulosis, internal hemorrhoids. Repeat in 10 years.   . COLONOSCOPY WITH PROPOFOL N/A 05/14/2017   Dr. Jena Gaussourk: diverticulosis in ascending colon, otherwise normal  . ESOPHAGOGASTRODUODENOSCOPY N/A 09/21/2014   Dr. Jena Gaussourk: Subtle non-critical Schatzki's ring s/p 56 F dilation, query occult cervical esophageal web. Hiatal hernia. Question gastroparesis.   [name]. ESOPHAGOGASTRODUODENOSCOPY (EGD) WITH PROPOFOL N/A 05/14/2017   Dr. Jena Gaussourk: normal esophagus s/p empiric dilation, benign gastric polyps, normal duodenum  . MALONEY DILATION N/A 09/21/2014   Procedure: Elease HashimotoMALONEY DILATION;  Surgeon: Corbin Adeobert M Rourk, MD;  [facility];  Service: Endoscopy;  Laterality: N/A;  . Elease HashimotoMALONEY DILATION N/A 05/14/2017   Procedure: Elease HashimotoMALONEY DILATION;  Surgeon: Corbin Adeourk, Robert M, MD;  [facility];  Service: Endoscopy;  Laterality: N/A;  . None    . SAVORY DILATION N/A 09/21/2014   Procedure: SAVORY DILATION;  Surgeon: Corbin Adeobert M Rourk, MD;  [facility];  Service: Endoscopy;  Laterality: N/A;  . TEE WITHOUT CARDIOVERSION N/A 02/02/2018   Procedure: TRANSESOPHAGEAL ECHOCARDIOGRAM (TEE) WITH PROPOFOL;  Surgeon: [name], MD;  [facility];  Service: Cardiovascular;  Laterality: N/A;    Current Outpatient Medications  Medication Sig Dispense Refill  . ARIPiprazole  (ABILIFY) 10 MG tablet Take 5 mg by mouth daily. Every morning    . ARIPiprazole (ABILIFY) 5 MG tablet Take 2.5 mg by mouth daily. With supper    . buPROPion (WELLBUTRIN SR) 150 MG 12 hr tablet Take 150 mg by mouth 2 (two) times daily.    [name] DEXILANT 60 MG capsule TAKE 1 CAPSULE BY MOUTH ONCE A DAY. 30 capsule 11  . docusate sodium (COLACE) 100 MG capsule TAKE 100 MG BY MOUTH TWICE DAILY FOR CONSTIPATION. 60 capsule 11  . ENSURE (ENSURE) USE 1 CAN BY MOUTH TWICE DAILY 2370 mL PRN  . fluticasone (FLONASE) 50 MCG/ACT nasal spray Place 2 sprays into both nostrils daily.    . mirtazapine (REMERON) 15 MG tablet TAKE 1 TABLET BY MOUTH DAILY 1 HOUR BEFORE BEDTIME. (Patient taking differently: TAKE 15 MG BY MOUTH DAILY 1 HOUR BEFORE BEDTIME.) 30 tablet 11  . oxybutynin (DITROPAN) 5 MG tablet TAKE 1 TABLET BY MOUTH EVERY MORNING. 30 tablet 11  . polyethylene glycol powder (GLYCOLAX/MIRALAX) powder MIX 17 GRAMS IN 8 OUNCES. OF WATER AND DRINK ONCE DAILY IN THE EVENING. 527 g 11  . potassium chloride (MICRO-K) 10 MEQ CR capsule Take 10 mEq by mouth 2 (two) times daily.    . ranitidine (ZANTAC) 300 MG tablet TAKE 1 TABLET BY MOUTH DAILY AT BEDTIME FOR SEVERE REFLUX. 30 tablet 11  . risperiDONE (RISPERDAL) 1 MG tablet Take 1 mg by mouth daily.    . tamsulosin (FLOMAX) 0.4 MG CAPS capsule TAKE 1 CAPSULE BY MOUTH ONCE DAILY 30 MINUTES AFTER THE SAME MEAL EACH DAY. 30 capsule 11  . triamterene-hydrochlorothiazide (MAXZIDE-25) 37.5-25 MG tablet TAKE (1) TABLET BY MOUTH DAILY IN THE MORNING FOR HIGH BLOOD PRESSURE. 30 tablet 11  . Vitamin D, Ergocalciferol, (DRISDOL) 50000 units CAPS capsule TAKE 1 CAPSULE BY MOUTH ONCE A MONTH ON THE 1ST. DO NOT CRUSH. 1 capsule 11   No current facility-administered medications for this visit.     Allergies as of 01/06/2019  . (No Known Allergies)    Family History  Problem Relation Age of Onset  . Colon cancer Other        unknown  . Hypertension Other   . Hyperlipidemia  Other     Social History   Socioeconomic History  . Marital status: Single    Spouse name: Not on file  . Number of children: Not on file  . Years of education: Not on file  . Highest education level: Not on file  Occupational History  . Not on file  Social Needs  . Financial resource strain: Not on file  . Food insecurity:    Worry: Not on file    Inability: Not on file  . Transportation needs:    Medical: Not on file    Non-medical: Not on file  Tobacco Use  . Smoking status: Current Every Day Smoker    Packs/day: 0.25    Years: 30.00    Pack years: 7.50    Types: Cigarettes  . Smokeless tobacco: Never Used  . Tobacco comment: 4 cigarettes a day  Substance and Sexual Activity  . Alcohol use: No  . Drug use: No  .  Sexual activity: Never    Birth control/protection: None  Lifestyle  . Physical activity:    Days per week: Not on file    Minutes per session: Not on file  . Stress: Not on file  Relationships  . Social connections:    Talks on phone: Not on file    Gets together: Not on file    Attends religious service: Not on file    Active member of club or organization: Not on file    Attends meetings of clubs or organizations: Not on file    Relationship status: Not on file  Other Topics Concern  . Not on file  Social History Narrative   Lives in Group Home.    Smokes cigarettes qd.   Able to smoke cigarettes at 8, 12, 4, 8pm.    Seen by [name] in [facility].    [facility].      Legal guardian: Armed forces logistics/support/administrative officer Department of Social services.     Review of Systems: General: Negative for anorexia, weight loss, fever, chills, fatigue, weakness. ENT: Negative for hoarseness, difficulty swallowing. CV: Negative for chest pain, angina, palpitations, peripheral edema.  Respiratory: Negative for dyspnea at rest, cough, sputum, wheezing.  GI: See history of present illness. Endo: Negative for unusual weight change.  Heme: Negative for  bruising or bleeding. Allergy: Negative for rash or hives.   Physical Exam: BP (!) 154/87   Pulse 70   Temp (!) 97.1 F (36.2 C) (Oral)   Ht 5\' 6"  (1.676 m)   Wt 144 lb 3.2 oz (65.4 kg)   BMI 23.27 kg/m  General:   Alert and oriented. Pleasant and cooperative. Well-nourished and well-developed.  Eyes:  Without icterus, sclera clear and conjunctiva pink.  Ears:  Normal auditory acuity. Cardiovascular:  S1, S2 present without murmurs appreciated. Extremities without clubbing or edema. Respiratory:  Clear to auscultation bilaterally. No wheezes, rales, or rhonchi. No distress.  Gastrointestinal:  +BS, soft, non-tender and non-distended. No HSM noted. No guarding or rebound. No masses appreciated.  Rectal:  Deferred  Musculoskalatal:  Symmetrical without gross deformities. Neurologic:  Alert and oriented x4;  grossly normal neurologically. Psych:  Alert and cooperative. Normal mood and affect. Heme/Lymph/Immune: No excessive bruising noted.    01/06/2019 1:45 PM   Disclaimer: This note was dictated with voice recognition software. Similar sounding words can inadvertently be transcribed and may not be corrected upon review.

## 2019-01-06 NOTE — Assessment & Plan Note (Signed)
In the past he has had weight loss.  His last visit he had gained 9 pounds.  This visit he is down about 7 pounds.  Although this is some weight loss it appears stable and fluctuating over the previous year.  He does have a good appetite.  No other red flag/warning or concerning signs or symptoms.  At this point we will continue to monitor his weight.  Follow-up in 1 year.

## 2019-01-06 NOTE — Assessment & Plan Note (Signed)
GERD symptoms generally well controlled on Dexilant.  The patient is a difficult historian.  He seems to say that sometimes he will have some esophageal discomfort.  Query occasional/rare breakthrough GERD.  I recommended he can try Tums as needed for breakthrough symptoms.  Follow-up in 1 year.

## 2019-01-07 NOTE — Progress Notes (Signed)
CC'D TO PCP °

## 2019-01-16 ENCOUNTER — Other Ambulatory Visit: Payer: Self-pay | Admitting: Nurse Practitioner

## 2019-02-02 DIAGNOSIS — I10 Essential (primary) hypertension: Secondary | ICD-10-CM | POA: Diagnosis not present

## 2019-02-02 DIAGNOSIS — K219 Gastro-esophageal reflux disease without esophagitis: Secondary | ICD-10-CM | POA: Diagnosis not present

## 2019-02-12 ENCOUNTER — Ambulatory Visit (HOSPITAL_COMMUNITY)
Admission: RE | Admit: 2019-02-12 | Discharge: 2019-02-12 | Disposition: A | Discharge status: Home / Self Care | Payer: Medicare Other | Source: Ambulatory Visit | Attending: Cardiology | Admitting: Cardiology

## 2019-02-12 DIAGNOSIS — I340 Nonrheumatic mitral (valve) insufficiency: Secondary | ICD-10-CM | POA: Diagnosis not present

## 2019-02-12 NOTE — Progress Notes (Signed)
*  PRELIMINARY RESULTS* Echocardiogram 2D Echocardiogram has been performed.  Stacey Drain 02/12/2019, 11:24 AM

## 2019-02-16 ENCOUNTER — Telehealth: Payer: Self-pay

## 2019-02-16 NOTE — Telephone Encounter (Signed)
-----   Message from Jonathan F Branch, MD sent at 02/16/2019 12:08 PM EST ----- Echo shows valve leak remains moderate, overall heart function is strong. Continue to monitor  J Branch MD 

## 2019-02-16 NOTE — Telephone Encounter (Signed)
Called pt. Phone number in chart is no longer active.

## 2019-02-17 ENCOUNTER — Telehealth: Payer: Self-pay

## 2019-02-17 NOTE — Telephone Encounter (Signed)
Called pt. Phone number not active. Will mail letter.

## 2019-02-17 NOTE — Telephone Encounter (Signed)
-----   Message from Antoine Poche, MD sent at 02/16/2019 12:08 PM EST ----- Echo shows valve leak remains moderate, overall heart function is strong. Continue to monitor  Dominga Ferry MD

## 2019-02-25 DIAGNOSIS — F209 Schizophrenia, unspecified: Secondary | ICD-10-CM | POA: Diagnosis not present

## 2019-03-25 ENCOUNTER — Ambulatory Visit: Payer: Medicare Other | Admitting: Cardiology

## 2019-05-03 DIAGNOSIS — L0291 Cutaneous abscess, unspecified: Secondary | ICD-10-CM | POA: Diagnosis not present

## 2019-05-04 DIAGNOSIS — L0291 Cutaneous abscess, unspecified: Secondary | ICD-10-CM | POA: Diagnosis not present

## 2019-05-13 DIAGNOSIS — L0291 Cutaneous abscess, unspecified: Secondary | ICD-10-CM | POA: Diagnosis not present

## 2019-05-13 DIAGNOSIS — I10 Essential (primary) hypertension: Secondary | ICD-10-CM | POA: Diagnosis not present

## 2019-06-02 DIAGNOSIS — F209 Schizophrenia, unspecified: Secondary | ICD-10-CM | POA: Diagnosis not present

## 2019-07-02 DIAGNOSIS — I10 Essential (primary) hypertension: Secondary | ICD-10-CM | POA: Diagnosis not present

## 2019-07-02 DIAGNOSIS — K219 Gastro-esophageal reflux disease without esophagitis: Secondary | ICD-10-CM | POA: Diagnosis not present

## 2019-07-07 ENCOUNTER — Encounter: Payer: Self-pay | Admitting: *Deleted

## 2019-07-07 ENCOUNTER — Encounter (INDEPENDENT_AMBULATORY_CARE_PROVIDER_SITE_OTHER): Payer: Self-pay

## 2019-07-07 ENCOUNTER — Encounter: Payer: Self-pay | Admitting: Cardiology

## 2019-07-07 ENCOUNTER — Ambulatory Visit (INDEPENDENT_AMBULATORY_CARE_PROVIDER_SITE_OTHER): Payer: Medicare Other | Admitting: Cardiology

## 2019-07-07 ENCOUNTER — Other Ambulatory Visit: Payer: Self-pay

## 2019-07-07 VITALS — BP 138/82 | HR 74 | Temp 98.1°F | Ht 66.0 in

## 2019-07-07 DIAGNOSIS — I10 Essential (primary) hypertension: Secondary | ICD-10-CM | POA: Diagnosis not present

## 2019-07-07 DIAGNOSIS — I340 Nonrheumatic mitral (valve) insufficiency: Secondary | ICD-10-CM | POA: Diagnosis not present

## 2019-07-07 NOTE — Progress Notes (Signed)
Clinical Summary [name] is a 63 y.o.male seen today for follow up of the following medical problems.  1. MItral regurgitation/heart murmur - newly diagnosed heart murmur detected by pcp - echo Jan 2019 LVEF 60-65%, no WMAs, normal diastolic function, moderate to severe MR. LVIDs 19.The MV VTI/AV VTI of 1.5 would actually suggest severe MR  - 01/2018 TEE that indicated moderate MR.  - 01/2019 TTE: moderate MR  - no recent edema. No SOB/DOE. Does daily chores without troubles.   2. Cognitive deficit/Mental handicap - has legal guardian  3. Schizophrenia - followed by pcp    FA:OZHYQMVSH:[name] [facility] [facility] is his [facility].   Past Medical History:  Diagnosis Date   Acute cholecystitis 01/25/2016   Anxiety    BPH (benign prostatic hyperplasia)    Chronic vomiting    GERD (gastroesophageal reflux disease)    Hypercholesteremia    Hypertension    Mental retardation    OSA (obstructive sleep apnea)    no cpap machine; could not tolerate.   Schatzki's ring    Schizophrenia (HCC)      No Known Allergies   Current Outpatient Medications  Medication Sig Dispense Refill   ARIPiprazole (ABILIFY) 10 MG tablet Take 5 mg by mouth daily. Every morning     ARIPiprazole (ABILIFY) 5 MG tablet Take 2.5 mg by mouth daily. With supper     buPROPion (WELLBUTRIN SR) 150 MG 12 hr tablet Take 150 mg by mouth 2 (two) times daily.     DEXILANT 60 MG capsule TAKE 1 CAPSULE BY MOUTH ONCE A DAY. 90 capsule 3   docusate sodium (COLACE) 100 MG capsule TAKE 100 MG BY MOUTH TWICE DAILY FOR CONSTIPATION. 60 capsule 11   ENSURE (ENSURE) USE 1 CAN BY MOUTH TWICE DAILY 2370 mL PRN   fluticasone (FLONASE) 50 MCG/ACT nasal spray Place 2 sprays into both nostrils daily.     mirtazapine (REMERON) 15 MG tablet TAKE 1 TABLET BY MOUTH DAILY 1 HOUR BEFORE BEDTIME. (Patient taking differently: TAKE 15 MG BY MOUTH DAILY 1 HOUR BEFORE BEDTIME.) 30 tablet 11    oxybutynin (DITROPAN) 5 MG tablet TAKE 1 TABLET BY MOUTH EVERY MORNING. 30 tablet 11   polyethylene glycol powder (GLYCOLAX/MIRALAX) powder MIX 17 GRAMS IN 8 OUNCES. OF WATER AND DRINK ONCE DAILY IN THE EVENING. 527 g 11   potassium chloride (MICRO-K) 10 MEQ CR capsule Take 10 mEq by mouth 2 (two) times daily.     ranitidine (ZANTAC) 300 MG tablet TAKE 1 TABLET BY MOUTH DAILY AT BEDTIME FOR SEVERE REFLUX. 30 tablet 11   risperiDONE (RISPERDAL) 1 MG tablet Take 1 mg by mouth daily.     tamsulosin (FLOMAX) 0.4 MG CAPS capsule TAKE 1 CAPSULE BY MOUTH ONCE DAILY 30 MINUTES AFTER THE SAME MEAL EACH DAY. 30 capsule 11   triamterene-hydrochlorothiazide (MAXZIDE-25) 37.5-25 MG tablet TAKE (1) TABLET BY MOUTH DAILY IN THE MORNING FOR HIGH BLOOD PRESSURE. 30 tablet 11   Vitamin D, Ergocalciferol, (DRISDOL) 50000 units CAPS capsule TAKE 1 CAPSULE BY MOUTH ONCE A MONTH ON THE 1ST. DO NOT CRUSH. 1 capsule 11   No current facility-administered medications for this visit.      Past Surgical History:  Procedure Laterality Date   BIOPSY  05/14/2017   Procedure: BIOPSY;  Surgeon: Corbin Adeourk, Robert M, MD;  [facility];  Service: Endoscopy;;  gastric polyp bx   CHOLECYSTECTOMY N/A 01/26/2016   Procedure: LAPAROSCOPIC CHOLECYSTECTOMY;  Surgeon: Franky MachoMark Jenkins, MD;  Location: [facility];  Service: General;  Laterality: N/A;   COLONOSCOPY N/A 09/21/2014   Dr. Gala Romney: right-sided diverticulosis, internal hemorrhoids. Repeat in 10 years.    COLONOSCOPY WITH PROPOFOL N/A 05/14/2017   Dr. Gala Romney: diverticulosis in ascending colon, otherwise normal   ESOPHAGOGASTRODUODENOSCOPY N/A 09/21/2014   Dr. Gala Romney: Subtle non-critical Schatzki's ring s/p 56 F dilation, query occult cervical esophageal web. Hiatal hernia. Question gastroparesis.    ESOPHAGOGASTRODUODENOSCOPY (EGD) WITH PROPOFOL N/A 05/14/2017   Dr. Gala Romney: normal esophagus s/p empiric dilation, benign gastric polyps, normal duodenum   MALONEY DILATION N/A  09/21/2014   Procedure: Venia Minks DILATION;  Surgeon: Daneil Dolin, MD;  [facility];  Service: Endoscopy;  Laterality: N/A;   MALONEY DILATION N/A 05/14/2017   Procedure: Venia Minks DILATION;  Surgeon: Daneil Dolin, MD;  [facility];  Service: Endoscopy;  Laterality: N/A;   None     SAVORY DILATION N/A 09/21/2014   Procedure: SAVORY DILATION;  Surgeon: Daneil Dolin, MD;  [facility];  Service: Endoscopy;  Laterality: N/A;   TEE WITHOUT CARDIOVERSION N/A 02/02/2018   Procedure: TRANSESOPHAGEAL ECHOCARDIOGRAM (TEE) WITH PROPOFOL;  Surgeon: Fay Records, MD;  [facility];  Service: Cardiovascular;  Laterality: N/A;     No Known Allergies    Family History  Problem Relation Age of Onset   Colon cancer Other        unknown   Hypertension Other    Hyperlipidemia Other      Social History Kyle Wade reports that he has been smoking cigarettes. He has a 7.50 pack-year smoking history. He has never used smokeless tobacco. Kyle Wade reports no history of alcohol use.   Review of Systems CONSTITUTIONAL: No weight loss, fever, chills, weakness or fatigue.  HEENT: Eyes: No visual loss, blurred vision, double vision or yellow sclerae.No hearing loss, sneezing, congestion, runny nose or sore throat.  SKIN: No rash or itching.  CARDIOVASCULAR: per hpi RESPIRATORY: No shortness of breath, cough or sputum.  GASTROINTESTINAL: No anorexia, nausea, vomiting or diarrhea. No abdominal pain or blood.  GENITOURINARY: No burning on urination, no polyuria NEUROLOGICAL: No headache, dizziness, syncope, paralysis, ataxia, numbness or tingling in the extremities. No change in bowel or bladder control.  MUSCULOSKELETAL: No muscle, back pain, joint pain or stiffness.  LYMPHATICS: No enlarged nodes. No history of splenectomy.  PSYCHIATRIC: No history of depression or anxiety.  ENDOCRINOLOGIC: No reports of sweating, cold or heat intolerance. No polyuria or  polydipsia.  [name]   Physical Examination Today's Vitals   07/07/19 0829  BP: 138/82  Pulse: 74  Temp: 98.1 F (36.7 C)  SpO2: 98%  Height: 5\' 6"  (1.676 m)   Body mass index is 23.27 kg/m.  Gen: resting comfortably, no acute distress HEENT: no scleral icterus, pupils equal round and reactive, no palptable cervical adenopathy,  CV: RRR, 3/6 systolic murmur apex, no jvd Resp: Clear to auscultation bilaterally GI: abdomen is soft, non-tender, non-distended, normal bowel sounds, no hepatosplenomegaly MSK: extremities are warm, no edema.  Skin: warm, no rash Neuro:  no focal deficits Psych: appropriate affect   Diagnostic Studies  01/2019 TTE  IMPRESSIONS    1. The left ventricle has normal systolic function with an ejection fraction of 60-65%. The cavity size was normal. There is mild concentric left ventricular hypertrophy. Left ventricular diastolic parameters were normal No evidence of left ventricular  regional wall motion abnormalities.  2. The right ventricle has normal systolic function. The cavity was  normal. There is no increase in right ventricular wall thickness.  3. The mitral valve is myxomatous. Mild thickening of the mitral valve leaflet. Mitral valve regurgitation is moderate by color flow Doppler. Severity may be underestimated due to jet eccentricity.  4. The tricuspid valve is normal in structure.  5. The aortic valve is tricuspid.  6. The pulmonic valve was grossly normal. Pulmonic valve regurgitation is mild by color flow Doppler.  7. The aortic root is normal in size and structure.  8. No evidence of left ventricular regional wall motion abnormalities.    Assessment and Plan  1. Mitral regurgitation -remains moderate by recent imaging, no recent symptoms - continue to monitor, would plan for repeat echo early 2021  2. HTN -manual recheck 125/65 at goal, continue current meds   F/u 6 months      Antoine PocheJonathan F. Amarien Carne, M.D.

## 2019-07-07 NOTE — Patient Instructions (Signed)
Medication Instructions:  Your physician recommends that you continue on your current medications as directed. Please refer to the Current Medication list given to you today.  If you need a refill on your cardiac medications before your next appointment, please call your pharmacy.   Lab work: NONE   If you have labs (blood work) drawn today and your tests are completely normal, you will receive your results only by: . MyChart Message (if you have MyChart) OR . A paper copy in the mail If you have any lab test that is abnormal or we need to change your treatment, we will call you to review the results.  Testing/Procedures: NONE   Follow-Up: At [facility], you and your health needs are our priority.  As part of our continuing mission to provide you with exceptional heart care, we have created designated Provider Care Teams.  These Care Teams include your primary Cardiologist (physician) and Advanced Practice Providers (APPs -  Physician Assistants and Nurse Practitioners) who all work together to provide you with the care you need, when you need it. You will need a follow up appointment in 6 months.  Please call our office 2 months in advance to schedule this appointment.  You may see Branch, Jonathan, MD or one of the following Advanced Practice Providers on your designated Care Team:   Brittany Strader, PA-C ([place] Office) . Michele Lenze, PA-C ([place] Office)  Any Other Special Instructions Will Be Listed Below (If Applicable). Thank you for choosing [facility]!     

## 2019-08-04 DIAGNOSIS — F209 Schizophrenia, unspecified: Secondary | ICD-10-CM | POA: Diagnosis not present

## 2019-09-28 DIAGNOSIS — Z23 Encounter for immunization: Secondary | ICD-10-CM | POA: Diagnosis not present

## 2019-11-04 DIAGNOSIS — F209 Schizophrenia, unspecified: Secondary | ICD-10-CM | POA: Diagnosis not present

## 2019-11-25 ENCOUNTER — Inpatient Hospital Stay (HOSPITAL_COMMUNITY): Payer: Medicare Other | Attending: Hematology

## 2019-12-01 ENCOUNTER — Encounter: Payer: Self-pay | Admitting: Internal Medicine

## 2019-12-02 ENCOUNTER — Ambulatory Visit (HOSPITAL_COMMUNITY): Payer: Medicare Other | Admitting: Hematology

## 2019-12-14 ENCOUNTER — Other Ambulatory Visit: Payer: Self-pay | Admitting: Gastroenterology

## 2019-12-21 ENCOUNTER — Ambulatory Visit: Payer: Medicare Other | Attending: Internal Medicine

## 2019-12-21 ENCOUNTER — Other Ambulatory Visit: Payer: Self-pay

## 2019-12-21 DIAGNOSIS — Z20822 Contact with and (suspected) exposure to covid-19: Secondary | ICD-10-CM

## 2019-12-23 LAB — NOVEL CORONAVIRUS, NAA: SARS-CoV-2, NAA: NOT DETECTED

## 2019-12-29 ENCOUNTER — Telehealth: Payer: Self-pay

## 2019-12-29 NOTE — Telephone Encounter (Signed)
Please fax result to [facility] Attn: Debra [facility] Fax: [phone]  

## 2020-01-01 IMAGING — CT CT CHEST W/O CM
2 of 3 series · 15 of 36 positions shown, 18 images · non-contrast
Comparison: Radiograph January 25, 2016.

CLINICAL DATA: Pulmonary nodules.

EXAM:
CT CHEST WITHOUT CONTRAST
TECHNIQUE: Multidetector CT imaging of the chest was performed following the
standard protocol without IV contrast.

[Series 2: thorax · axial · 0.67mm/px · z∈[-396,-166]mm · 12 of 137 slices shown, 15 images]
[im 11/137  mediastinal]
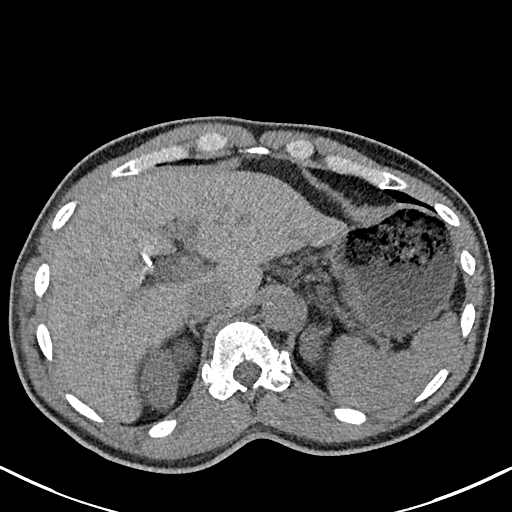
[im 11/137  lung]
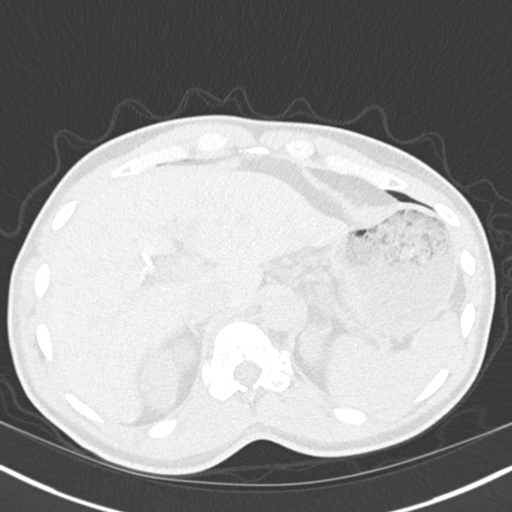
[im 21/137  lung]
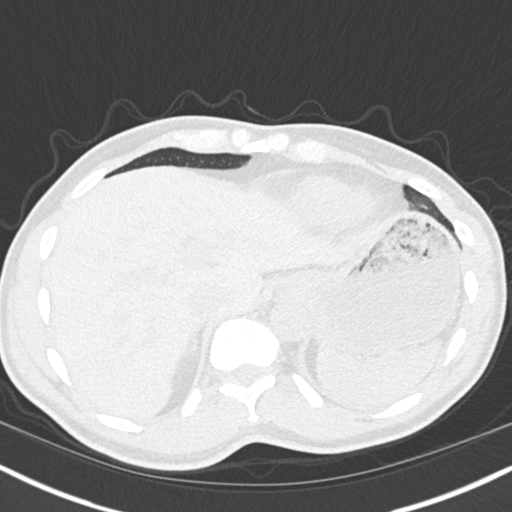
[im 31/137  lung]
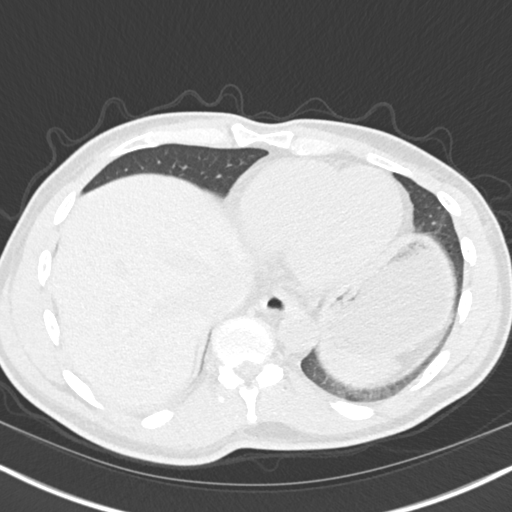
[im 41/137  lung]
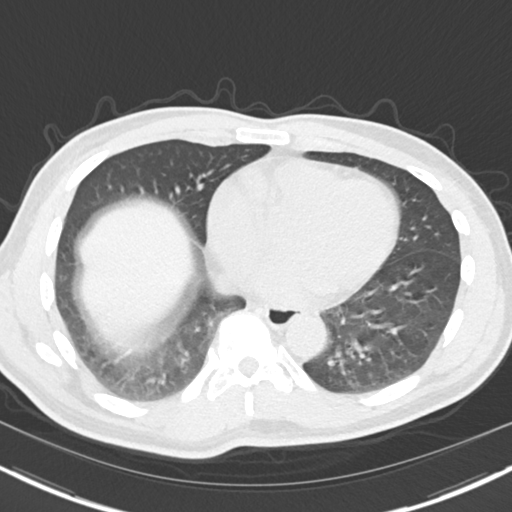
[im 51/137  mediastinal]
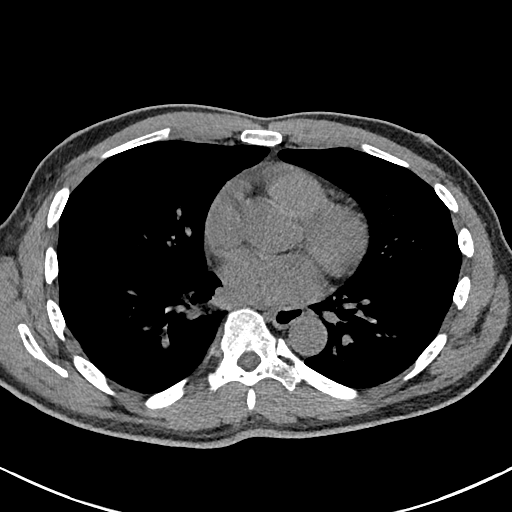
[im 51/137  lung]
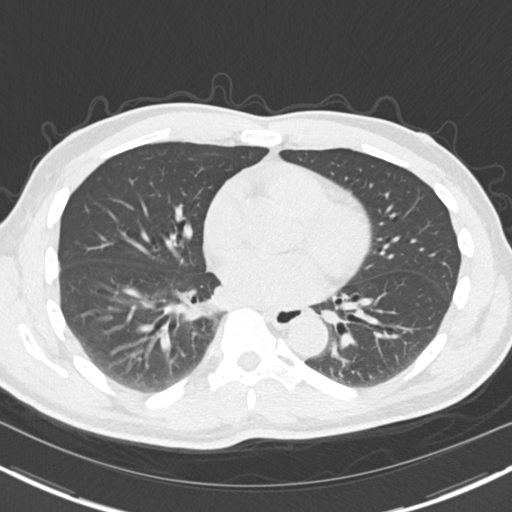
[im 61/137  lung]
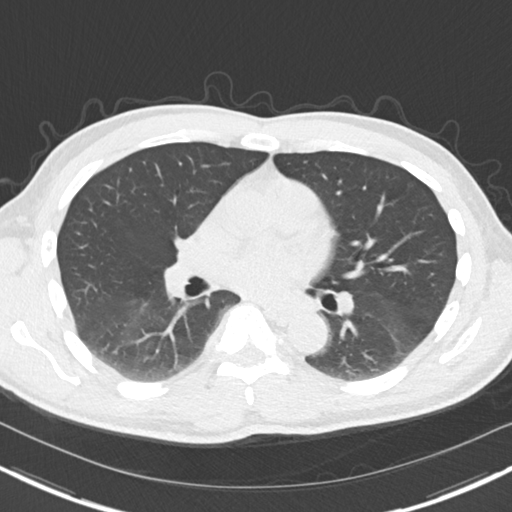
[im 76/137  lung]
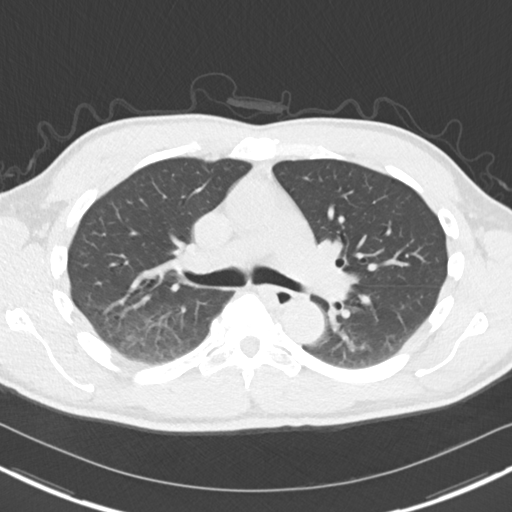
[im 86/137  lung]
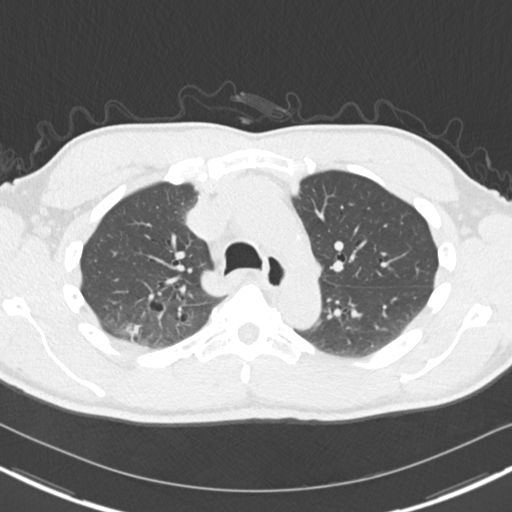
[im 96/137  mediastinal]
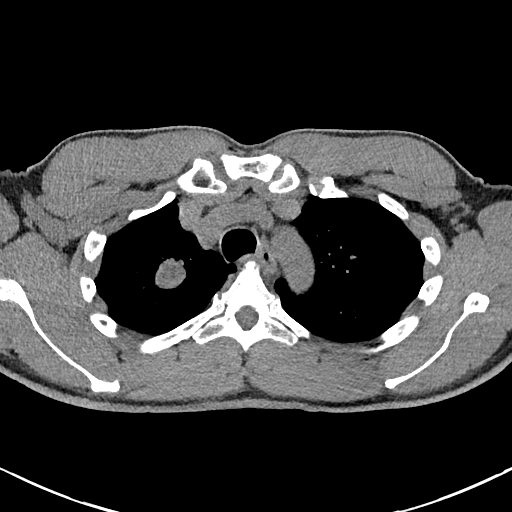
[im 96/137  lung]
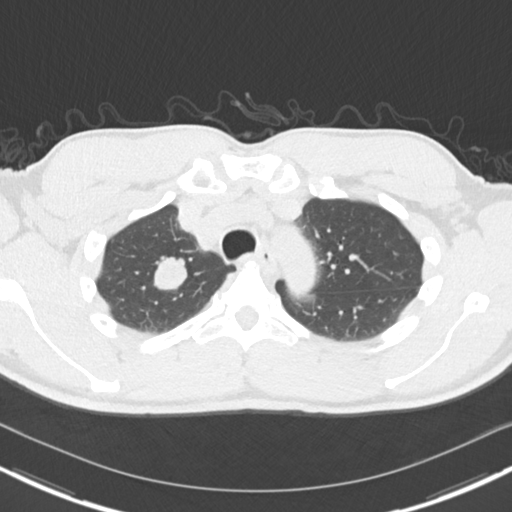
[im 106/137  lung]
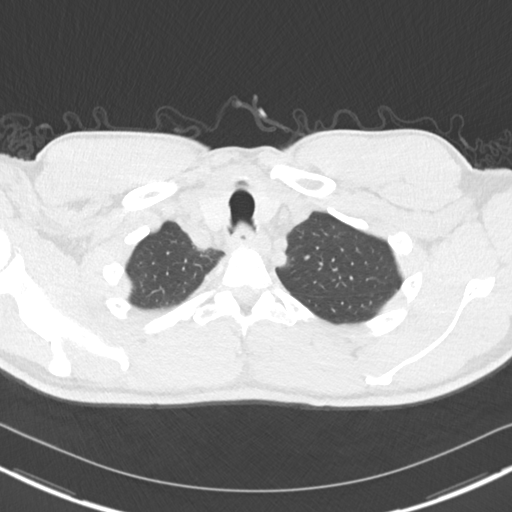
[im 116/137  lung]
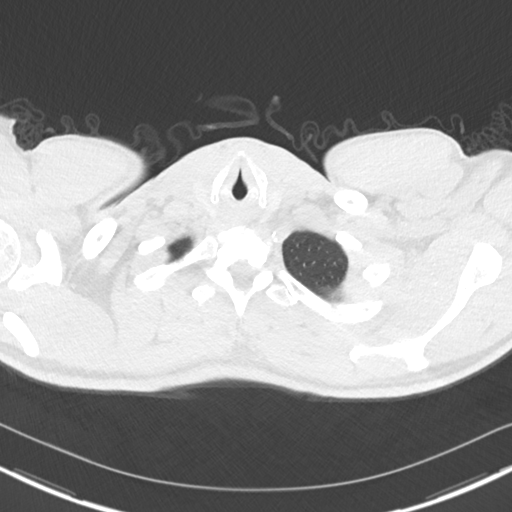
[im 126/137  lung]
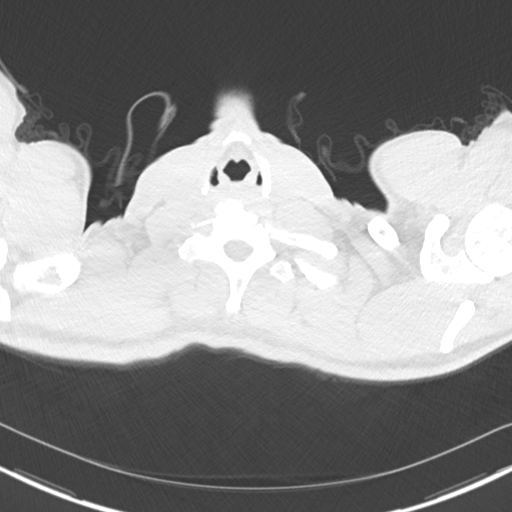

[Series 5: coronal · coronal · 0.59mm/px · 3 of 115 slices shown]
[im 23/115  lung]
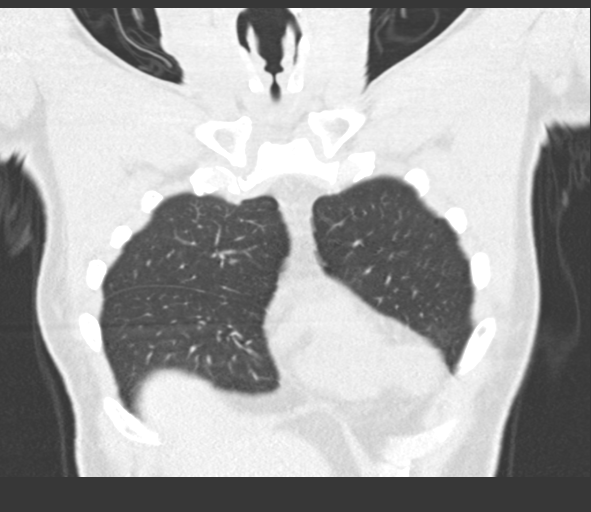
[im 46/115  lung]
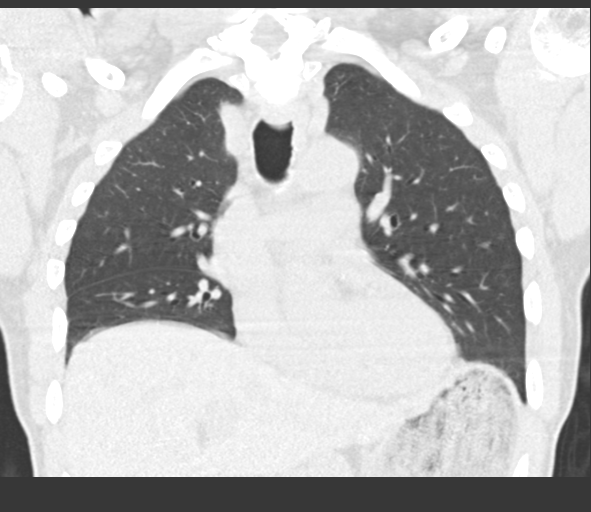
[im 69/115  lung]
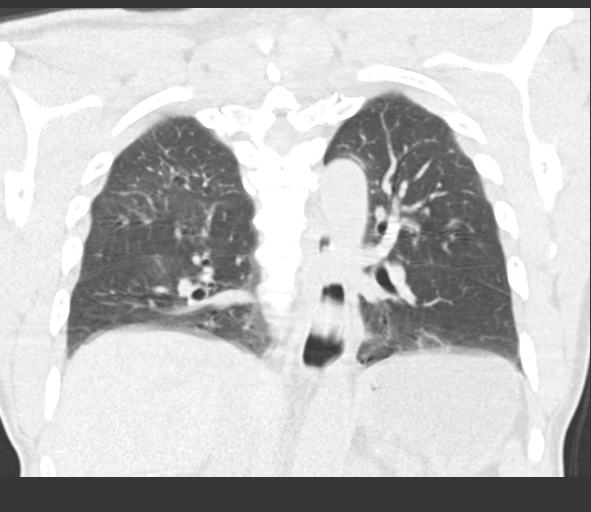

[15 of 36 positions shown; findings below may reference images not displayed]

FINDINGS: Cardiovascular: There is no evidence of thoracic aortic aneurysm.
Normal cardiac size. No pericardial effusion. Minimal coronary
artery calcifications are noted.

Mediastinum/Nodes: No enlarged mediastinal or axillary lymph nodes.
Thyroid gland, trachea, and esophagus demonstrate no significant
findings.

Lungs/Pleura: No pneumothorax or pleural effusion is noted. 5 mm
subpleural nodule is noted laterally in the left lung base best seen
on image number 94 series [DATE] cm rounded nodular density is noted
in right upper lobe which was present on prior radiograph. There
does appear to be minimal scarring or atelectasis involving the
posterior portion of the right upper lobe with some associated bulla
formation.

Upper Abdomen: No acute abnormality.

Musculoskeletal: No chest wall mass or suspicious bone lesions
identified.
IMPRESSION: 2.3 cm rounded nodule is noted in right upper which appears grossly
unchanged compared to prior chest radiograph January 25, 2016.
This most likely is benign given the last of significant change over
2 year interval.

There is noted mild density posteriorly in the right upper lobe
which may represent scarring or subsegmental atelectasis, with
adjacent bulla formation.

5 mm subpleural nodule is noted laterally in left lower lobe. No
follow-up needed if patient is low-risk. Non-contrast chest CT can
be considered in 12 months if patient is high-risk. This
recommendation follows the consensus statement: Guidelines for
Management of Incidental Pulmonary Nodules Detected on CT Images:

## 2020-01-11 ENCOUNTER — Other Ambulatory Visit: Payer: Self-pay | Admitting: Gastroenterology

## 2020-01-12 ENCOUNTER — Telehealth: Payer: Self-pay

## 2020-01-12 NOTE — Telephone Encounter (Signed)
PA for Dexilant was started on covermymeds.com. waiting on an approval or denial.

## 2020-01-13 NOTE — Telephone Encounter (Signed)
PA has been approved for 1 year for Dexilant 60 mg. The pharmacy was notified of approval.

## 2020-02-23 ENCOUNTER — Ambulatory Visit: Payer: Medicare Other | Admitting: Nurse Practitioner

## 2020-02-23 NOTE — Progress Notes (Deleted)
Referring Provider: [facility], [facility] Aschoff* Primary Care Physician:  [facility], [facility] Primary GI:  Dr.   Bonnetta Barry chief complaint on file.   HPI:   Kyle Wade is a 63 y.o. male who presents for follow-up.  Patient was last seen in our office 01/06/2019 for weight loss, constipation, GERD.  Noted history of impaired cognitive status (mentally challenged) and historically difficult to obtain complete history.  HIDA scan 2017 with acute cholecystitis.  CT around that time negative for mesenteric ischemia.  Underwent cholecystectomy in 2017 with Dr. Lovell Sheehan.  Persistent leukopenia with hematology follow-up felt likely due to the origin seen and recommended close monitoring due to potential significant agranulocytosis.  Colonoscopy and EGD up-to-date.  He was previously recommended to take Ensure and particularly enjoys vanilla and strawberry.  GERD historically well managed on Dexilant and constipation managed on MiraLAX.  At his last visit he was accompanied by household friend.  Rare/occasional GERD, bowel movement every 2 to 3 days which is typically soft and passes easily but sometimes does have straining.  Intermittent abdominal pain when he has not eaten stating "feels like I need to eat."  His pain/discomfort typically resolves after eating.  No other overt GI complaints.  Recommended continue Dexilant, continue Colace and MiraLAX, follow-up in 1 year.  Today he states   Past Medical History:  Diagnosis Date  . Acute cholecystitis 01/25/2016  . Anxiety   . BPH (benign prostatic hyperplasia)   . Chronic vomiting   . GERD (gastroesophageal reflux disease)   . Hypercholesteremia   . Hypertension   . Mental retardation   . OSA (obstructive sleep apnea)    no cpap machine; could not tolerate.  . Schatzki's ring   . Schizophrenia [facility])     Past Surgical History:  Procedure Laterality Date  . BIOPSY  05/14/2017   Procedure: BIOPSY;  Surgeon: [name], MD;   [facility];  Service: Endoscopy;;  gastric polyp bx  . CHOLECYSTECTOMY N/A 01/26/2016   Procedure: LAPAROSCOPIC CHOLECYSTECTOMY;  Surgeon: [name], MD;  Location: [facility];  Service: General;  Laterality: N/A;  . COLONOSCOPY N/A 09/21/2014   [name]: right-sided diverticulosis, internal hemorrhoids. Repeat in 10 years.   . COLONOSCOPY WITH PROPOFOL N/A 05/14/2017   [name]: diverticulosis in ascending colon, otherwise normal  . ESOPHAGOGASTRODUODENOSCOPY N/A 09/21/2014   [name]: Subtle non-critical Schatzki's ring s/p 56 F dilation, query occult cervical esophageal web. Hiatal hernia. Question gastroparesis.   [name] ESOPHAGOGASTRODUODENOSCOPY (EGD) WITH PROPOFOL N/A 05/14/2017   [name]: normal esophagus s/p empiric dilation, benign gastric polyps, normal duodenum  . MALONEY DILATION N/A 09/21/2014   Procedure: Elease Hashimoto DILATION;  Surgeon: [name], MD;  [facility];  Service: Endoscopy;  Laterality: N/A;  . Elease Hashimoto DILATION N/A 05/14/2017   Procedure: Elease Hashimoto DILATION;  Surgeon: [name], MD;  [facility];  Service: Endoscopy;  Laterality: N/A;  . None    . SAVORY DILATION N/A 09/21/2014   Procedure: SAVORY DILATION;  Surgeon: [name], MD;  [facility];  Service: Endoscopy;  Laterality: N/A;  . TEE WITHOUT CARDIOVERSION N/A 02/02/2018   Procedure: TRANSESOPHAGEAL ECHOCARDIOGRAM (TEE) WITH PROPOFOL;  Surgeon: [name], MD;  [facility];  Service: Cardiovascular;  Laterality: N/A;    Current Outpatient Medications  Medication Sig Dispense Refill  . ARIPiprazole (ABILIFY) 10 MG tablet Take 5 mg by mouth daily. Every morning    .  buPROPion (WELLBUTRIN SR) 150 MG 12 hr tablet Take 150 mg by mouth 2 (two) times daily.    [name] DEXILANT 60 MG capsule TAKE 1 CAPSULE BY MOUTH ONCE A DAY. 30 capsule 5  . docusate sodium (COLACE) 100 MG capsule TAKE (1) CAPSULE BY MOUTH TWICE DAILY FOR CONSTIPATION. 60 capsule 3  .  ENSURE (ENSURE) USE 1 CAN BY MOUTH TWICE DAILY 2370 mL PRN  . fluticasone (FLONASE) 50 MCG/ACT nasal spray Place 2 sprays into both nostrils daily.    . mirtazapine (REMERON) 15 MG tablet TAKE 1 TABLET BY MOUTH DAILY 1 HOUR BEFORE BEDTIME. (Patient taking differently: TAKE 15 MG BY MOUTH DAILY 1 HOUR BEFORE BEDTIME.) 30 tablet 11  . oxybutynin (DITROPAN) 5 MG tablet TAKE 1 TABLET BY MOUTH EVERY MORNING. 30 tablet 11  . polyethylene glycol powder (GLYCOLAX/MIRALAX) powder MIX 17 GRAMS IN 8 OUNCES. OF WATER AND DRINK ONCE DAILY IN THE EVENING. 527 g 11  . potassium chloride (MICRO-K) 10 MEQ CR capsule Take 10 mEq by mouth 2 (two) times daily.    . ranitidine (ZANTAC) 300 MG tablet TAKE 1 TABLET BY MOUTH DAILY AT BEDTIME FOR SEVERE REFLUX. 30 tablet 11  . risperiDONE (RISPERDAL) 1 MG tablet Take 1 mg by mouth daily.    . tamsulosin (FLOMAX) 0.4 MG CAPS capsule TAKE 1 CAPSULE BY MOUTH ONCE DAILY 30 MINUTES AFTER THE SAME MEAL EACH DAY. 30 capsule 11  . triamterene-hydrochlorothiazide (MAXZIDE-25) 37.5-25 MG tablet TAKE (1) TABLET BY MOUTH DAILY IN THE MORNING FOR HIGH BLOOD PRESSURE. 30 tablet 11  . Vitamin D, Ergocalciferol, (DRISDOL) 50000 units CAPS capsule TAKE 1 CAPSULE BY MOUTH ONCE A MONTH ON THE 1ST. DO NOT CRUSH. 1 capsule 11   No current facility-administered medications for this visit.    Allergies as of 02/23/2020  . (No Known Allergies)    Family History  Problem Relation Age of Onset  . Colon cancer Other        unknown  . Hypertension Other   . Hyperlipidemia Other     Social History   Socioeconomic History  . Marital status: Single    Spouse name: Not on file  . Number of children: Not on file  . Years of education: Not on file  . Highest education level: Not on file  Occupational History  . Not on file  Tobacco Use  . Smoking status: Current Every Day Smoker    Packs/day: 0.25    Years: 30.00    Pack years: 7.50    Types: Cigarettes  . Smokeless tobacco: Never  Used  . Tobacco comment: 4 cigarettes a day  Substance and Sexual Activity  . Alcohol use: No  . Drug use: No  . Sexual activity: Never    Birth control/protection: None  Other Topics Concern  . Not on file  Social History Narrative   Lives in Group Home.    Smokes cigarettes qd.   Able to smoke cigarettes at 8, 12, 4, 8pm.    Seen by [name] in [facility].    [facility].      Legal guardian: [employer] forces logistics/support/administrative officer [employer].    Social Determinants of Health   Financial Resource Strain:   . Difficulty of Paying Living Expenses: Not on file  Food Insecurity:   . Worried About Programme researcher, broadcasting/film/video in the Last Year: Not on file  . Ran Out of Food in the Last Year: Not on file  Transportation Needs:   .  Lack of Transportation (Medical): Not on file  . Lack of Transportation (Non-Medical): Not on file  Physical Activity:   . Days of Exercise per Week: Not on file  . Minutes of Exercise per Session: Not on file  Stress:   . Feeling of Stress : Not on file  Social Connections:   . Frequency of Communication with Friends and Family: Not on file  . Frequency of Social Gatherings with Friends and Family: Not on file  . Attends Religious Services: Not on file  . Active Member of Clubs or Organizations: Not on file  . Attends Archivist Meetings: Not on file  . Marital Status: Not on file    Review of Systems: General: Negative for anorexia, weight loss, fever, chills, fatigue, weakness. Eyes: Negative for vision changes.  ENT: Negative for hoarseness, difficulty swallowing , nasal congestion. CV: Negative for chest pain, angina, palpitations, dyspnea on exertion, peripheral edema.  Respiratory: Negative for dyspnea at rest, dyspnea on exertion, cough, sputum, wheezing.  GI: See history of present illness. GU:  Negative for dysuria, hematuria, urinary incontinence, urinary frequency, nocturnal urination.  MS: Negative for joint pain,  low back pain.  Derm: Negative for rash or itching.  Neuro: Negative for weakness, abnormal sensation, seizure, frequent headaches, memory loss, confusion.  Psych: Negative for anxiety, depression, suicidal ideation, hallucinations.  Endo: Negative for unusual weight change.  Heme: Negative for bruising or bleeding. Allergy: Negative for rash or hives.   Physical Exam: There were no vitals taken for this visit. General:   Alert and oriented. Pleasant and cooperative. Well-nourished and well-developed.  Head:  Normocephalic and atraumatic. Eyes:  Without icterus, sclera clear and conjunctiva pink.  Ears:  Normal auditory acuity. Mouth:  No deformity or lesions, oral mucosa pink.  Throat/Neck:  Supple, without mass or thyromegaly. Cardiovascular:  S1, S2 present without murmurs appreciated. Normal pulses noted. Extremities without clubbing or edema. Respiratory:  Clear to auscultation bilaterally. No wheezes, rales, or rhonchi. No distress.  Gastrointestinal:  +BS, soft, non-tender and non-distended. No HSM noted. No guarding or rebound. No masses appreciated.  Rectal:  Deferred  Musculoskalatal:  Symmetrical without gross deformities. Normal posture. Skin:  Intact without significant lesions or rashes. Neurologic:  Alert and oriented x4;  grossly normal neurologically. Psych:  Alert and cooperative. Normal mood and affect. Heme/Lymph/Immune: No significant cervical adenopathy. No excessive bruising noted.    02/23/2020 1:22 PM   Disclaimer: This note was dictated with voice recognition software. Similar sounding words can inadvertently be transcribed and may not be corrected upon review.

## 2020-03-02 ENCOUNTER — Encounter: Payer: Self-pay | Admitting: Nurse Practitioner

## 2020-03-02 ENCOUNTER — Other Ambulatory Visit: Payer: Self-pay

## 2020-03-02 ENCOUNTER — Ambulatory Visit (INDEPENDENT_AMBULATORY_CARE_PROVIDER_SITE_OTHER): Payer: Medicare Other | Admitting: Nurse Practitioner

## 2020-03-02 VITALS — BP 155/73 | HR 81 | Temp 96.9°F | Ht 66.0 in | Wt 139.2 lb

## 2020-03-02 DIAGNOSIS — R634 Abnormal weight loss: Secondary | ICD-10-CM | POA: Diagnosis not present

## 2020-03-02 DIAGNOSIS — K5900 Constipation, unspecified: Secondary | ICD-10-CM

## 2020-03-02 NOTE — Assessment & Plan Note (Signed)
Weight loss appears stabilized.  Has a good appetite, drinking 2 Ensure drinks a day.  Recommend he continue his current medications and diet.  Continue Ensure.  Call for any recurrent weight loss, otherwise follow-up in 1 year.

## 2020-03-02 NOTE — Progress Notes (Signed)
Referring Provider: Alliance, Miguel Aschoff* Primary Care Physician:  [facility], [facility] Primary GI:  [name]  Chief Complaint  Patient presents with  . Weight Loss  . Constipation    BM's are daily, not straining    HPI:   Kyle Wade is a 64 y.o. male who presents for follow-up.  The patient was last seen in our office 01/06/2019 for weight loss, constipation, GERD.  Noted history of impaired cognitive status (mentally challenged) and historically difficult to obtain a complete history.  HIDA scan in 2017 with acute cholecystitis.  CTA around that time also negative for mesenteric ischemia.  Cholecystectomy in 2017 by Dr. Lovell Sheehan.  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  Colonoscopy and EGD up-to-date.  At his last visit he was accompanied by household friend.  Notes rare/occasional GERD, bowel movement every 2 to 3 days that is soft and passes easily but sometimes straining.  Intermittent abdominal pain when he has not eaten and stating "feels like I need to eat" and typically resolves when he eats.  Currently on Dexilant, Colace, MiraLAX.  Weight is fluctuant but overall stable.  No other overt GI complaints.  Recommended continue medications, Tums as needed for "breakthrough" heartburn, follow-up in 1 year.  Today he is accompanied by a friend from the home. He has been doing well overall. Has a great appetite. No obvious subjective weight loss at the home; objectively he is within about 3-4 lbs in the past year. Still drinking Ensure. No further weight loss, good appetite. Bowel movements are daily and without straining. Currently on Colace and MiraLAX. Denies abdominal pain, N/V, hematochezia, melena, fever, chills, unintentional weight loss. Denies URI or flu-like symptoms. Denies loss of sense of taste or smell. Last tested for COVID-19 about a month  ago and negative. He got his first COVID-19 vaccination on Monday. Has a second dose scheduled. Denies chest pain, dyspnea, dizziness, lightheadedness, syncope, near syncope. Denies any other upper or lower GI symptoms.  Past Medical History:  Diagnosis Date  . Acute cholecystitis 01/25/2016  . Anxiety   . BPH (benign prostatic hyperplasia)   . Chronic vomiting   . GERD (gastroesophageal reflux disease)   . Hypercholesteremia   . Hypertension   . Mental retardation   . OSA (obstructive sleep apnea)    no cpap machine; could not tolerate.  . Schatzki's ring   . Schizophrenia [facility])     Past Surgical History:  Procedure Laterality Date  . BIOPSY  05/14/2017   Procedure: BIOPSY;  Surgeon: [name], MD;  Location: [facility];  Service: Endoscopy;;  gastric polyp bx  . CHOLECYSTECTOMY N/A 01/26/2016   Procedure: LAPAROSCOPIC CHOLECYSTECTOMY;  Surgeon: [name], MD;  Location: [facility] ORS;  Service: General;  Laterality: N/A;  . COLONOSCOPY N/A 09/21/2014   [name]: right-sided diverticulosis, internal hemorrhoids. Repeat in 10 years.   . COLONOSCOPY WITH PROPOFOL N/A 05/14/2017   [name]: diverticulosis in ascending colon, otherwise normal  . ESOPHAGOGASTRODUODENOSCOPY N/A 09/21/2014   [name]: Subtle non-critical Schatzki's ring s/p 56 F dilation, query occult cervical esophageal web. Hiatal hernia. Question gastroparesis.   [name] ESOPHAGOGASTRODUODENOSCOPY (EGD) WITH PROPOFOL N/A 05/14/2017   [name]: normal esophagus s/p empiric dilation, benign gastric polyps, normal duodenum  . MALONEY DILATION N/A 09/21/2014   Procedure: Elease Hashimoto DILATION;  Surgeon: [name], MD;  Location: [facility];  Service: Endoscopy;  Laterality: N/A;  .  MALONEY DILATION N/A 05/14/2017   Procedure: Elease Hashimoto DILATION;  Surgeon: [name], MD;  Location: [facility];  Service: Endoscopy;  Laterality: N/A;  . None    . SAVORY DILATION N/A 09/21/2014   Procedure: SAVORY DILATION;  Surgeon:  [name], MD;  Location: [facility];  Service: Endoscopy;  Laterality: N/A;  . TEE WITHOUT CARDIOVERSION N/A 02/02/2018   Procedure: TRANSESOPHAGEAL ECHOCARDIOGRAM (TEE) WITH PROPOFOL;  Surgeon: [name], MD;  Location: [facility];  Service: Cardiovascular;  Laterality: N/A;    Current Outpatient Medications  Medication Sig Dispense Refill  . ARIPiprazole (ABILIFY) 10 MG tablet Take 5 mg by mouth in the morning and at bedtime.     [name] buPROPion (WELLBUTRIN SR) 150 MG 12 hr tablet Take 150 mg by mouth 2 (two) times daily.    [name] DEXILANT 60 MG capsule TAKE 1 CAPSULE BY MOUTH ONCE A DAY. 30 capsule 5  . docusate sodium (COLACE) 100 MG capsule TAKE (1) CAPSULE BY MOUTH TWICE DAILY FOR CONSTIPATION. 60 capsule 3  . ENSURE (ENSURE) USE 1 CAN BY MOUTH TWICE DAILY 2370 mL PRN  . famotidine (PEPCID) 40 MG tablet Take 40 mg by mouth at bedtime.    . fluticasone (FLONASE) 50 MCG/ACT nasal spray Place 2 sprays into both nostrils daily.    . mirtazapine (REMERON) 15 MG tablet TAKE 1 TABLET BY MOUTH DAILY 1 HOUR BEFORE BEDTIME. (Patient taking differently: TAKE 15 MG BY MOUTH DAILY 1 HOUR BEFORE BEDTIME.) 30 tablet 11  . oxybutynin (DITROPAN) 5 MG tablet TAKE 1 TABLET BY MOUTH EVERY MORNING. 30 tablet 11  . polyethylene glycol powder (GLYCOLAX/MIRALAX) powder MIX 17 GRAMS IN 8 OUNCES. OF WATER AND DRINK ONCE DAILY IN THE EVENING. 527 g 11  . potassium chloride (MICRO-K) 10 MEQ CR capsule Take 10 mEq by mouth 2 (two) times daily.    . risperiDONE (RISPERDAL) 1 MG tablet Take 1 mg by mouth daily.    . tamsulosin (FLOMAX) 0.4 MG CAPS capsule TAKE 1 CAPSULE BY MOUTH ONCE DAILY 30 MINUTES AFTER THE SAME MEAL EACH DAY. 30 capsule 11  . triamterene-hydrochlorothiazide (MAXZIDE-25) 37.5-25 MG tablet TAKE (1) TABLET BY MOUTH DAILY IN THE MORNING FOR HIGH BLOOD PRESSURE. 30 tablet 11  . Vitamin D, Ergocalciferol, (DRISDOL) 50000 units CAPS capsule TAKE 1 CAPSULE BY MOUTH ONCE A MONTH ON THE 1ST. DO NOT  CRUSH. 1 capsule 11   No current facility-administered medications for this visit.    Allergies as of 03/02/2020  . (No Known Allergies)    Family History  Problem Relation Age of Onset  . Colon cancer Other        unknown  . Hypertension Other   . Hyperlipidemia Other     Social History   Socioeconomic History  . Marital status: Single    Spouse name: Not on file  . Number of children: Not on file  . Years of education: Not on file  . Highest education level: Not on file  Occupational History  . Not on file  Tobacco Use  . Smoking status: Current Every Day Smoker    Packs/day: 0.25    Years: 30.00    Pack years: 7.50    Types: Cigarettes  . Smokeless tobacco: Never Used  . Tobacco comment: 4 cigarettes a day  Substance and Sexual Activity  . Alcohol use: No  . Drug use: No  . Sexual activity: Never    Birth control/protection: None  Other Topics  Concern  . Not on file  Social History Narrative   Lives in [place].    Smokes cigarettes qd.   Able to smoke cigarettes at 8, 12, 4, 8pm.    Seen by Dr. Tamera Punt in [facility].    [place].      Legal guardian: Engineer, petroleum Department of Social services.    Social Determinants of Health   Financial Resource Strain:   . Difficulty of Paying Living Expenses:   Food Insecurity:   . Worried About Charity fundraiser in the Last Year:   . Arboriculturist in the Last Year:   Transportation Needs:   . Film/video editor (Medical):   [name] Lack of Transportation (Non-Medical):   Physical Activity:   . Days of Exercise per Week:   . Minutes of Exercise per Session:   Stress:   . Feeling of Stress :   Social Connections:   . Frequency of Communication with Friends and Family:   . Frequency of Social Gatherings with Friends and Family:   . Attends Religious Services:   . Active Member of Clubs or Organizations:   . Attends Archivist Meetings:   [name] Marital Status:     Review  of Systems: General: Negative for anorexia, weight loss, fever, chills, fatigue, weakness. ENT: Negative for hoarseness, difficulty swallowing. CV: Negative for chest pain, angina, palpitations, peripheral edema.  Respiratory: Negative for dyspnea at rest, cough, sputum, wheezing.  GI: See history of present illness. Endo: Negative for unusual weight change.  Heme: Negative for bruising or bleeding. Allergy: Negative for rash or hives.   Physical Exam: BP (!) 155/73   Pulse 81   Temp (!) 96.9 F (36.1 C) (Oral)   Ht 5\' 6"  (1.676 m)   Wt 139 lb 3.2 oz (63.1 kg)   BMI 22.47 kg/m  General:   Alert and oriented. Pleasant and cooperative. Well-nourished and well-developed.  Eyes:  Without icterus, sclera clear and conjunctiva pink.  Ears:  Normal auditory acuity. Cardiovascular:  S1, S2 present without murmurs appreciated. Extremities without clubbing or edema. Respiratory:  Clear to auscultation bilaterally. No wheezes, rales, or rhonchi. No distress.  Gastrointestinal:  +BS, soft, non-tender and non-distended. No HSM noted. No guarding or rebound. No masses appreciated.  Rectal:  Deferred  Musculoskalatal:  Symmetrical without gross deformities. Neurologic:  Alert and oriented x4;  grossly normal neurologically. Psych:  Alert and cooperative. Normal mood and affect. Heme/Lymph/Immune: No excessive bruising noted.    03/02/2020 10:47 AM   Disclaimer: This note was dictated with voice recognition software. Similar sounding words can inadvertently be transcribed and may not be corrected upon review.

## 2020-03-02 NOTE — Patient Instructions (Signed)
Your health issues we discussed today were:   Weight loss and constipation: 1. I am glad you are doing so well! 2. Continue taking your current medications including Colace and MiraLAX for constipation 3. Continue Ensure supplements twice a day as they are helping you maintain your weight 4. Call us if you have any worsening or severe/recurrent symptoms  Overall I recommend:  1. Continue your other current medications 2. Return for follow-up in 1 year 3. Call us if you have any questions or concerns   ---------------------------------------------------------------  I am glad you got your first COVID-19 vaccine!  Keep your appointment for your second vaccine.  Even though you have received the vaccine, continue to wear a mask, socially distance (keep 6 feet apart) from people you do not live with, and wash your hands regularly  ---------------------------------------------------------------   At [facility] we value your feedback. You may receive a survey about your visit today. Please share your experience as we strive to create trusting relationships with our patients to provide genuine, compassionate, quality care.  We appreciate your understanding and patience as we review any laboratory studies, imaging, and other diagnostic tests that are ordered as we care for you. Our office policy is 5 business days for review of these results, and any emergent or urgent results are addressed in a timely manner for your best interest. If you do not hear from our office in 1 week, please contact us.   We also encourage the use of MyChart, which contains your medical information for your review as well. If you are not enrolled in this feature, an access code is on this after visit summary for your convenience. Thank you for allowing [place] to be involved in your care.  It was great to see you today!  I hope you have a great day!!

## 2020-03-02 NOTE — Assessment & Plan Note (Signed)
Constipation currently well managed.  Recommend continue current medications including Colace and MiraLAX.  Follow-up in 1 year, call if any problems before then.

## 2020-07-12 ENCOUNTER — Other Ambulatory Visit: Payer: Self-pay | Admitting: Nurse Practitioner

## 2020-09-25 ENCOUNTER — Encounter: Payer: Self-pay | Admitting: Physician Assistant

## 2020-09-25 ENCOUNTER — Ambulatory Visit (INDEPENDENT_AMBULATORY_CARE_PROVIDER_SITE_OTHER): Payer: Medicare Other | Admitting: Physician Assistant

## 2020-09-25 ENCOUNTER — Other Ambulatory Visit: Payer: Self-pay

## 2020-09-25 VITALS — BP 136/72 | HR 67 | Ht 67.0 in | Wt 144.0 lb

## 2020-09-25 DIAGNOSIS — I340 Nonrheumatic mitral (valve) insufficiency: Secondary | ICD-10-CM

## 2020-09-25 DIAGNOSIS — F209 Schizophrenia, unspecified: Secondary | ICD-10-CM | POA: Diagnosis not present

## 2020-09-25 DIAGNOSIS — F79 Unspecified intellectual disabilities: Secondary | ICD-10-CM

## 2020-09-25 DIAGNOSIS — I10 Essential (primary) hypertension: Secondary | ICD-10-CM | POA: Diagnosis not present

## 2020-09-25 DIAGNOSIS — Z720 Tobacco use: Secondary | ICD-10-CM

## 2020-09-25 NOTE — Progress Notes (Signed)
Cardiology Office Note    Date:  09/25/2020   ID:  Kyle Wade, DOB 07-28-1956, MRN [id number]  PCP:  [facility]  Cardiologist: Dina Rich, MD EPS: None  Chief Complaint  Patient presents with  . Pre-op Exam    History of Present Illness:  Kyle Wade is a 64 y.o. male with history of moderate mitral regurgitation with normal LV function on echo 01/2019, hypertension, intellectual disability, schizophrenia.  Patient on my schedule for preoperative clearance before dental implant work by Dr. [facility]. They aren't sure patient is even going to agree to having it done. Patient says he noticed shortness of breath after smoking a cigarette. Smoking 4 cig/day. Patient lives in a group home and walks a fair amount-goes to a park, walks very slowly for 10 min, rests and then does 3-4 times. Denies chest pain or shortness of breath. Patient doesn't complain.   Past Medical History:  Diagnosis Date  . Acute cholecystitis 01/25/2016  . Anxiety   . BPH (benign prostatic hyperplasia)   . Chronic vomiting   . GERD (gastroesophageal reflux disease)   . Hypercholesteremia   . Hypertension   . Mental retardation   . OSA (obstructive sleep apnea)    no cpap machine; could not tolerate.  . Schatzki's ring   . Schizophrenia [facility])     Past Surgical History:  Procedure Laterality Date  . BIOPSY  05/14/2017   Procedure: BIOPSY;  Surgeon: [name], MD;  Location: [facility];  Service: Endoscopy;;  gastric polyp bx  . CHOLECYSTECTOMY N/A 01/26/2016   Procedure: LAPAROSCOPIC CHOLECYSTECTOMY;  Surgeon: [name], MD;  Location: [facility] ORS;  Service: General;  Laterality: N/A;  . COLONOSCOPY N/A 09/21/2014   [name]: right-sided diverticulosis, internal hemorrhoids. Repeat in 10 years.   . COLONOSCOPY WITH PROPOFOL N/A 05/14/2017   [name]: diverticulosis in ascending colon, otherwise normal  . ESOPHAGOGASTRODUODENOSCOPY N/A 09/21/2014   Dr.  [name]: Subtle non-critical Schatzki's ring s/p 56 F dilation, query occult cervical esophageal web. Hiatal hernia. Question gastroparesis.   [name] ESOPHAGOGASTRODUODENOSCOPY (EGD) WITH PROPOFOL N/A 05/14/2017   [name]: normal esophagus s/p empiric dilation, benign gastric polyps, normal duodenum  . MALONEY DILATION N/A 09/21/2014   Procedure: Elease Hashimoto DILATION;  Surgeon: [name], MD;  Location[facility];  Service: Endoscopy;  Laterality: N/A;  . Elease Hashimoto DILATION N/A 05/14/2017   Procedure: Elease Hashimoto DILATION;  Surgeon: [name], MD;  [facility];  Service: Endoscopy;  Laterality: N/A;  . None    . SAVORY DILATION N/A 09/21/2014   Procedure: SAVORY DILATION;  Surgeon: [name], MD;  [facility];  Service: Endoscopy;  Laterality: N/A;  . TEE WITHOUT CARDIOVERSION N/A 02/02/2018   Procedure: TRANSESOPHAGEAL ECHOCARDIOGRAM (TEE) WITH PROPOFOL;  Surgeon: [name], MD;  Location: [facility];  Service: Cardiovascular;  Laterality: N/A;    Current Medications: Current Meds  Medication Sig  . ARIPiprazole (ABILIFY) 10 MG tablet Take 5 mg by mouth in the morning and at bedtime.   . ARIPiprazole (ABILIFY) 5 MG tablet Take 5 mg by mouth daily with supper.  [name] buPROPion (WELLBUTRIN SR) 150 MG 12 hr tablet Take 150 mg by mouth 2 (two) times daily.  [name] DEXILANT 60 MG capsule TAKE 1 CAPSULE BY MOUTH ONCE A DAY.  [name] docusate sodium (COLACE) 100 MG capsule TAKE (1) CAPSULE BY MOUTH TWICE DAILY FOR CONSTIPATION.  [name] ENSURE (ENSURE) USE 1  CAN BY MOUTH TWICE DAILY  . famotidine (PEPCID) 40 MG tablet Take 40 mg by mouth at bedtime.  . fluticasone (FLONASE) 50 MCG/ACT nasal spray Place 2 sprays into both nostrils daily.  . mirtazapine (REMERON) 15 MG tablet TAKE 1 TABLET BY MOUTH DAILY 1 HOUR BEFORE BEDTIME. (Patient taking differently: TAKE 15 MG BY MOUTH DAILY 1 HOUR BEFORE BEDTIME.)  . oxybutynin (DITROPAN) 5 MG tablet TAKE 1 TABLET BY MOUTH EVERY MORNING.  [name]  polyethylene glycol powder (GLYCOLAX/MIRALAX) powder MIX 17 GRAMS IN 8 OUNCES. OF WATER AND DRINK ONCE DAILY IN THE EVENING.  [name] potassium chloride (MICRO-K) 10 MEQ CR capsule Take 10 mEq by mouth 2 (two) times daily.  . risperiDONE (RISPERDAL) 1 MG tablet Take 1 mg by mouth daily.  . tamsulosin (FLOMAX) 0.4 MG CAPS capsule TAKE 1 CAPSULE BY MOUTH ONCE DAILY 30 MINUTES AFTER THE SAME MEAL EACH DAY.  [name] triamterene-hydrochlorothiazide (MAXZIDE-25) 37.5-25 MG tablet TAKE (1) TABLET BY MOUTH DAILY IN THE MORNING FOR HIGH BLOOD PRESSURE.  . Vitamin D, Ergocalciferol, (DRISDOL) 50000 units CAPS capsule TAKE 1 CAPSULE BY MOUTH ONCE A MONTH ON THE 1ST. DO NOT CRUSH.     Allergies:   Patient has no known allergies.   Social History   Socioeconomic History  . Marital status: Single    Spouse name: Not on file  . Number of children: Not on file  . Years of education: Not on file  . Highest education level: Not on file  Occupational History  . Not on file  Tobacco Use  . Smoking status: Current Every Day Smoker    Packs/day: 0.25    Years: 30.00    Pack years: 7.50    Types: Cigarettes  . Smokeless tobacco: Never Used  . Tobacco comment: 4 cigarettes a day  Vaping Use  . Vaping Use: Never used  Substance and Sexual Activity  . Alcohol use: No  . Drug use: No  . Sexual activity: Never    Birth control/protection: None  Other Topics Concern  . Not on file  Social History Narrative   Lives in Group Home.    Smokes cigarettes qd.   Able to smoke cigarettes at 8, 12, 4, 8pm.    Seen by [name] in [facility].    [facility] Home -[facility].      Legal guardian: Armed forces logistics/support/administrative officer Department of Social services.    Social Determinants of Health   Financial Resource Strain:   . Difficulty of Paying Living Expenses: Not on file  Food Insecurity:   . Worried About Programme researcher, broadcasting/film/video in the Last Year: Not on file  . Ran Out of Food in the Last Year: Not on file  Transportation Needs:     . Lack of Transportation (Medical): Not on file  . Lack of Transportation (Non-Medical): Not on file  Physical Activity:   . Days of Exercise per Week: Not on file  . Minutes of Exercise per Session: Not on file  Stress:   . Feeling of Stress : Not on file  Social Connections:   . Frequency of Communication with Friends and Family: Not on file  . Frequency of Social Gatherings with Friends and Family: Not on file  . Attends Religious Services: Not on file  . Active Member of Clubs or Organizations: Not on file  . Attends Banker Meetings: Not on file  . Marital Status: Not on file     Family History:  The patient's family  history includes Colon cancer in an other family member; Hyperlipidemia in an other family member; Hypertension in an other family member.   ROS:   Please see the history of present illness.    ROS All other systems reviewed and are negative.   PHYSICAL EXAM:   VS:  BP 136/72   Pulse 67   Ht  (1.702 m)   Wt 144 lb (65.3 kg)   SpO2 99%   BMI 22.55 kg/m   Physical Exam  GEN: Thin, in no acute distress  Neck: no JVD, carotid bruits, or masses Cardiac:RRR; 3/6 to 4/6 systolic murmur at the left sternal border Respiratory: Decreased breath sounds with scattered wheezes  GI: soft, nontender, nondistended, + BS Ext: without cyanosis, clubbing, or edema, Good distal pulses bilaterally MS: no deformity or atrophy  Skin: warm and dry, no rash Neuro:  Alert and Oriented x 3,  Psych: euthymic mood, full affect  Wt Readings from Last 3 Encounters:  09/25/20 144 lb (65.3 kg)  03/02/20 139 lb 3.2 oz (63.1 kg)  01/06/19 144 lb 3.2 oz (65.4 kg)      Studies/Labs Reviewed:   EKG:  EKG is  ordered today.  The ekg ordered today demonstrates normal sinus rhythm with first-degree AV block poor R wave progression anteriorly which is similar to EKG in 2018 but different from 2019.  Recent Labs: No results found for requested labs within last 8760  hours.   Lipid Panel    Component Value Date/Time   CHOL 175 04/08/2018 1052   TRIG 42 04/08/2018 1052   HDL 67 04/08/2018 1052   CHOLHDL 2.6 04/08/2018 1052   LDLCALC 96 04/08/2018 1052    Additional studies/ records that were reviewed today include:  2D echo 01/2019  IMPRESSIONS     1. The left ventricle has normal systolic function with an ejection  fraction of 60-65%. The cavity size was normal. There is mild concentric  left ventricular hypertrophy. Left ventricular diastolic parameters were  normal No evidence of left ventricular  regional wall motion abnormalities.   2. The right ventricle has normal systolic function. The cavity was  normal. There is no increase in right ventricular wall thickness.   3. The mitral valve is myxomatous. Mild thickening of the mitral valve  leaflet. Mitral valve regurgitation is moderate by color flow Doppler.  Severity may be underestimated due to jet eccentricity.   4. The tricuspid valve is normal in structure.   5. The aortic valve is tricuspid.   6. The pulmonic valve was grossly normal. Pulmonic valve regurgitation is  mild by color flow Doppler.   7. The aortic root is normal in size and structure.   8. No evidence of left ventricular regional wall motion abnormalities.   FINDINGS   Left Ventricle: The left ventricle has normal systolic function, with an  ejection fraction of 60-65%. The cavity size was normal. There is mild  concentric left ventricular hypertrophy. Left ventricular diastolic  parameters were normal No evidence of  left ventricular regional wall motion abnormalities..  Right Ventricle: The right ventricle has normal systolic function. The  cavity was normal. There is no increase in right ventricular wall  thickness.  Left Atrium: left atrial size was normal in size  Right Atrium: right atrial size was normal in size. Right atrial pressure  is estimated at 3 mmHg.  Interatrial Septum: No atrial level shunt  detected by color flow Doppler.  Pericardium: There is no evidence of pericardial effusion.  Mitral Valve: The mitral valve is myxomatous. Mild thickening of the  mitral valve leaflet. Mitral valve regurgitation is moderate by color flow  Doppler.  Tricuspid Valve: The tricuspid valve is normal in structure. Tricuspid  valve regurgitation is trivial by color flow Doppler.  Aortic Valve: The aortic valve is tricuspid Aortic valve regurgitation was  not visualized by color flow Doppler. There is no evidence of aortic valve  stenosis.  Pulmonic Valve: The pulmonic valve was grossly normal. Pulmonic valve  regurgitation is mild by color flow Doppler.  Aorta: The aortic root is normal in size and structure.  Venous: The inferior vena cava is normal in size with greater than 50%  respiratory variability.     MITRAL VALVE  MV Area (PHT): 2.73 cm  MV Peak grad:  5.1 mmHg  MV Mean grad:  2.0 mmHg  MV Vmax:       1.13 m/s  MV Vmean:      57.9 cm/s  MV VTI:        0.30 m  MV PHT:        80.62 msec  MV Decel Time: 278 msec  MR Peak grad: 100.8 mmHg  MR Mean grad: 57.0 mmHg  MR Vmax:      502.00 cm/s  MR Vmean:     341.0 cm/s  MV E velocity: 92.50 cm/s  MV A velocity: 81.30 cm/s  MV E/A ratio:  1.14        ASSESSMENT:    1. Mitral valve insufficiency, unspecified etiology   2. Essential hypertension   3. Intellectual disability   4. Schizophrenia, unspecified type (HCC)   5. Tobacco abuse      PLAN:  In order of problems listed above:  Preoperative clearance before potentially undergoing dental implants for which she has to be under general anesthesia to be performed [facility].  Patient's very limited because of his mental disability, living in a group home and he does not complain.  His social worker feels like he has slowed down.  He moves very slow but does chores and walks every day.  He says he gets short of breath if he smokes a  cigarette.  Staff that walks with him say he is so slow but does not exhibit shortness of breath.  Perioperative risk index is low at 0.4% and functional METs is over 4 so he should be able to proceed with the above surgery.  We will check a 2D echo because he is due for this to reassess his MR. According to the Revised Cardiac Risk Index (RCRI), his Perioperative Risk of Major Cardiac Event is (%): 0.4  His Functional Capacity in METs is: 4.79 according to the [employer] Activity Status Index (DASI).    Mitral regurgitation moderate MR TTE 01/2019  Hypertension blood pressure controlled on Maxide  Cognitive deficit/mental handicap [name] with [place] [employer] is his guardian  Schizophrenia  Tobacco abuse he is limited to 4 cigarettes daily.    Medication Adjustments/Labs and Tests Ordered: Current medicines are reviewed at length with the patient today.  Concerns regarding medicines are outlined above.  Medication changes, Labs and Tests ordered today are listed in the Patient Instructions below. Patient Instructions  Medication Instructions:  Your physician recommends that you continue on your current medications as directed. Please refer to the Current Medication list given to you today.  *If you need a refill on your cardiac medications before your next appointment, please call your  pharmacy*   Lab Work: None today If you have labs (blood work) drawn today and your tests are completely normal, you will receive your results only by: [name] MyChart Message (if you have MyChart) OR . A paper copy in the mail If you have any lab test that is abnormal or we need to change your treatment, we will call you to review the results.   Testing/Procedures: Your physician has requested that you have an echocardiogram. Echocardiography is a painless test that uses sound waves to create images of your heart. It provides your doctor with information about the size and shape of your heart and how  well your heart's chambers and valves are working. This procedure takes approximately one hour. There are no restrictions for this procedure.     Follow-Up: At [facility], you and your health needs are our priority.  As part of our continuing mission to provide you with exceptional heart care, we have created designated Provider Care Teams.  These Care Teams include your primary Cardiologist (physician) and Advanced Practice Providers (APPs -  Physician Assistants and Nurse Practitioners) who all work together to provide you with the care you need, when you need it.  We recommend signing up for the patient portal called "MyChart".  Sign up information is provided on this After Visit Summary.  MyChart is used to connect with patients for Virtual Visits (Telemedicine).  Patients are able to view lab/test results, encounter notes, upcoming appointments, etc.  Non-urgent messages can be sent to your provider as well.   To learn more about what you can do with MyChart, go to [email or web address].    Your next appointment:   6 month(s)  The format for your next appointment:   In Person  Provider:   You may see Dina Rich, MD or one of the following Advanced Practice Providers on your designated Care Team:    Randall An, PA-C   Jacolyn Reedy, PA-C     Other Instructions None      Thank you for choosing [facility] !            Elson Clan, PA-C  09/25/2020 12:53 PM    [facility] [address] [place], [place], [place]  [address] Phone: [phone]; Fax: [phone]

## 2020-09-25 NOTE — Patient Instructions (Signed)
Medication Instructions:  Your physician recommends that you continue on your current medications as directed. Please refer to the Current Medication list given to you today.  *If you need a refill on your cardiac medications before your next appointment, please call your pharmacy*   Lab Work: None today If you have labs (blood work) drawn today and your tests are completely normal, you will receive your results only by:  MyChart Message (if you have MyChart) OR  A paper copy in the mail If you have any lab test that is abnormal or we need to change your treatment, we will call you to review the results.   Testing/Procedures: Your physician has requested that you have an echocardiogram. Echocardiography is a painless test that uses sound waves to create images of your heart. It provides your doctor with information about the size and shape of your heart and how well your hearts chambers and valves are working. This procedure takes approximately one hour. There are no restrictions for this procedure.     Follow-Up: At [facility], you and your health needs are our priority.  As part of our continuing mission to provide you with exceptional heart care, we have created designated Provider Care Teams.  These Care Teams include your primary Cardiologist (physician) and Advanced Practice Providers (APPs -  Physician Assistants and Nurse Practitioners) who all work together to provide you with the care you need, when you need it.  We recommend signing up for the patient portal called "MyChart".  Sign up information is provided on this After Visit Summary.  MyChart is used to connect with patients for Virtual Visits (Telemedicine).  Patients are able to view lab/test results, encounter notes, upcoming appointments, etc.  Non-urgent messages can be sent to your provider as well.   To learn more about what you can do with MyChart, go to [email or web address].    Your next appointment:   6  month(s)  The format for your next appointment:   In Person  Provider:   You may see Dina Rich, MD or one of the following Advanced Practice Providers on your designated Care Team:    Randall An, PA-C   Jacolyn Reedy, PA-C     Other Instructions None      Thank you for choosing [facility] !

## 2020-10-03 ENCOUNTER — Other Ambulatory Visit: Payer: Self-pay

## 2020-10-03 ENCOUNTER — Ambulatory Visit (HOSPITAL_COMMUNITY)
Admission: RE | Admit: 2020-10-03 | Discharge: 2020-10-03 | Disposition: A | Discharge status: Home / Self Care | Payer: Medicare Other | Source: Ambulatory Visit | Attending: Physician Assistant | Admitting: Physician Assistant

## 2020-10-03 DIAGNOSIS — I340 Nonrheumatic mitral (valve) insufficiency: Secondary | ICD-10-CM | POA: Diagnosis present

## 2020-10-03 LAB — ECHOCARDIOGRAM COMPLETE
AR max vel: 2.47 cm2
AV Area VTI: 2.51 cm2
AV Area mean vel: 2.49 cm2
AV Mean grad: 3 mmHg
AV Peak grad: 6.6 mmHg
Ao pk vel: 1.29 m/s
Area-P 1/2: 2.58 cm2
S' Lateral: 2.48 cm

## 2020-10-03 NOTE — Progress Notes (Signed)
*  PRELIMINARY RESULTS* Echocardiogram 2D Echocardiogram has been performed.  Stacey Drain 10/03/2020, 10:24 AM

## 2020-10-09 ENCOUNTER — Encounter: Payer: Self-pay | Admitting: *Deleted

## 2020-11-06 ENCOUNTER — Telehealth: Payer: Self-pay | Admitting: Cardiology

## 2020-11-06 NOTE — Telephone Encounter (Signed)
  Patient Consent for Virtual Visit         Kyle Wade has provided verbal consent on 11/06/2020 for a virtual visit (video or telephone).   CONSENT FOR VIRTUAL VISIT FOR:  Kyle Wade  By participating in this virtual visit I agree to the following:  I hereby voluntarily request, consent and authorize [facility] and its employed or contracted physicians, Producer, television/film/video, nurse practitioners or other licensed health care professionals (the Practitioner), to provide me with telemedicine health care services (the "Services") as deemed necessary by the treating Practitioner. I acknowledge and consent to receive the Services by the Practitioner via telemedicine. I understand that the telemedicine visit will involve communicating with the Practitioner through live audiovisual communication technology and the disclosure of certain medical information by electronic transmission. I acknowledge that I have been given the opportunity to request an in-person assessment or other available alternative prior to the telemedicine visit and am voluntarily participating in the telemedicine visit.  I understand that I have the right to withhold or withdraw my consent to the use of telemedicine in the course of my care at any time, without affecting my right to future care or treatment, and that the Practitioner or I may terminate the telemedicine visit at any time. I understand that I have the right to inspect all information obtained and/or recorded in the course of the telemedicine visit and may receive copies of available information for a reasonable fee.  I understand that some of the potential risks of receiving the Services via telemedicine include:  [name] Delay or interruption in medical evaluation due to technological equipment failure or disruption; . Information transmitted may not be sufficient (e.g. poor resolution of images) to allow for appropriate medical decision making by the Practitioner;  and/or  . In rare instances, security protocols could fail, causing a breach of personal health information.  Furthermore, I acknowledge that it is my responsibility to provide information about my medical history, conditions and care that is complete and accurate to the best of my ability. I acknowledge that Practitioner's advice, recommendations, and/or decision may be based on factors not within their control, such as incomplete or inaccurate data provided by me or distortions of diagnostic images or specimens that may result from electronic transmissions. I understand that the practice of medicine is not an exact science and that Practitioner makes no warranties or guarantees regarding treatment outcomes. I acknowledge that a copy of this consent can be made available to me via my patient portal [facility] MyChart), or I can request a printed copy by calling the office of [facility].    I understand that my insurance will be billed for this visit.   I have read or had this consent read to me. . I understand the contents of this consent, which adequately explains the benefits and risks of the Services being provided via telemedicine.  . I have been provided ample opportunity to ask questions regarding this consent and the Services and have had my questions answered to my satisfaction. . I give my informed consent for the services to be provided through the use of telemedicine in my medical care

## 2021-02-06 ENCOUNTER — Other Ambulatory Visit (HOSPITAL_COMMUNITY): Payer: Self-pay | Admitting: *Deleted

## 2021-02-06 DIAGNOSIS — D696 Thrombocytopenia, unspecified: Secondary | ICD-10-CM

## 2021-02-06 DIAGNOSIS — D649 Anemia, unspecified: Secondary | ICD-10-CM

## 2021-02-06 DIAGNOSIS — D500 Iron deficiency anemia secondary to blood loss (chronic): Secondary | ICD-10-CM

## 2021-02-07 ENCOUNTER — Other Ambulatory Visit: Payer: Self-pay

## 2021-02-07 ENCOUNTER — Inpatient Hospital Stay (HOSPITAL_COMMUNITY): Payer: Medicare Other | Attending: Hematology

## 2021-02-07 DIAGNOSIS — D500 Iron deficiency anemia secondary to blood loss (chronic): Secondary | ICD-10-CM

## 2021-02-07 DIAGNOSIS — D509 Iron deficiency anemia, unspecified: Secondary | ICD-10-CM | POA: Diagnosis not present

## 2021-02-07 DIAGNOSIS — D696 Thrombocytopenia, unspecified: Secondary | ICD-10-CM | POA: Diagnosis not present

## 2021-02-07 DIAGNOSIS — D649 Anemia, unspecified: Secondary | ICD-10-CM

## 2021-02-07 LAB — COMPREHENSIVE METABOLIC PANEL
ALT: 16 U/L (ref 0–44)
AST: 23 U/L (ref 15–41)
Albumin: 4.2 g/dL (ref 3.5–5.0)
Alkaline Phosphatase: 57 U/L (ref 38–126)
Anion gap: 7 (ref 5–15)
BUN: 9 mg/dL (ref 8–23)
CO2: 31 mmol/L (ref 22–32)
Calcium: 9.4 mg/dL (ref 8.9–10.3)
Chloride: 97 mmol/L — ABNORMAL LOW (ref 98–111)
Creatinine, Ser: 1.08 mg/dL (ref 0.61–1.24)
GFR, Estimated: >60 mL/min (ref >60)
Glucose, Bld: 82 mg/dL (ref 70–99)
Potassium: 3.6 mmol/L (ref 3.5–5.1)
Sodium: 135 mmol/L (ref 135–145)
Total Bilirubin: 0.8 mg/dL (ref 0.3–1.2)
Total Protein: 7.2 g/dL (ref 6.5–8.1)

## 2021-02-07 LAB — CBC WITH DIFFERENTIAL/PLATELET
Abs Immature Granulocytes: 0.01 10*3/uL (ref 0.00–0.07)
Basophils Absolute: 0 10*3/uL (ref 0.0–0.1)
Basophils Relative: 0 %
Eosinophils Absolute: 0 10*3/uL (ref 0.0–0.5)
Eosinophils Relative: 1 %
HCT: 45 % (ref 39.0–52.0)
Hemoglobin: 15.1 g/dL (ref 13.0–17.0)
Immature Granulocytes: 0 %
Lymphocytes Relative: 31 %
Lymphs Abs: 1.2 10*3/uL (ref 0.7–4.0)
MCH: 30.7 pg (ref 26.0–34.0)
MCHC: 33.6 g/dL (ref 30.0–36.0)
MCV: 91.5 fL (ref 80.0–100.0)
Monocytes Absolute: 0.4 10*3/uL (ref 0.1–1.0)
Monocytes Relative: 10 %
Neutro Abs: 2.2 10*3/uL (ref 1.7–7.7)
Neutrophils Relative %: 58 %
Platelets: 135 10*3/uL — ABNORMAL LOW (ref 150–400)
RBC: 4.92 MIL/uL (ref 4.22–5.81)
RDW: 12.1 % (ref 11.5–15.5)
WBC: 3.7 10*3/uL — ABNORMAL LOW (ref 4.0–10.5)
nRBC: 0 % (ref 0.0–0.2)

## 2021-02-07 LAB — VITAMIN B12: Vitamin B-12: 270 pg/mL (ref 180–914)

## 2021-02-07 LAB — FOLATE: Folate: 13.4 ng/mL (ref >5.9)

## 2021-02-07 LAB — LACTATE DEHYDROGENASE: LDH: 219 U/L — ABNORMAL HIGH (ref 98–192)

## 2021-02-14 ENCOUNTER — Inpatient Hospital Stay (HOSPITAL_COMMUNITY): Payer: Medicare Other | Attending: Hematology | Admitting: Hematology

## 2021-02-14 ENCOUNTER — Other Ambulatory Visit: Payer: Self-pay

## 2021-02-14 VITALS — BP 129/78 | HR 68 | Temp 97.0°F | Resp 18 | Wt 143.8 lb

## 2021-02-14 DIAGNOSIS — N400 Enlarged prostate without lower urinary tract symptoms: Secondary | ICD-10-CM | POA: Insufficient documentation

## 2021-02-14 DIAGNOSIS — F17210 Nicotine dependence, cigarettes, uncomplicated: Secondary | ICD-10-CM | POA: Diagnosis not present

## 2021-02-14 DIAGNOSIS — R7402 Elevation of levels of lactic acid dehydrogenase (LDH): Secondary | ICD-10-CM | POA: Insufficient documentation

## 2021-02-14 DIAGNOSIS — I10 Essential (primary) hypertension: Secondary | ICD-10-CM | POA: Insufficient documentation

## 2021-02-14 DIAGNOSIS — E7800 Pure hypercholesterolemia, unspecified: Secondary | ICD-10-CM | POA: Diagnosis not present

## 2021-02-14 DIAGNOSIS — D696 Thrombocytopenia, unspecified: Secondary | ICD-10-CM | POA: Diagnosis present

## 2021-02-14 DIAGNOSIS — K219 Gastro-esophageal reflux disease without esophagitis: Secondary | ICD-10-CM | POA: Diagnosis not present

## 2021-02-14 DIAGNOSIS — D72819 Decreased white blood cell count, unspecified: Secondary | ICD-10-CM | POA: Insufficient documentation

## 2021-02-14 DIAGNOSIS — Z79899 Other long term (current) drug therapy: Secondary | ICD-10-CM | POA: Insufficient documentation

## 2021-02-14 NOTE — Patient Instructions (Signed)
[facility] at [facility] Discharge Instructions  You were seen today by Dr. Katragadda. He went over your recent results. Dr. Katragadda will see you back in 1 year for labs and follow up.   Thank you for choosing [facility] at [facility] to provide your oncology and hematology care.  To afford each patient quality time with our provider, please arrive at least 15 minutes before your scheduled appointment time.   If you have a lab appointment with the Cancer Center please come in thru the Main Entrance and check in at the main information desk  You need to re-schedule your appointment should you arrive 10 or more minutes late.  We strive to give you quality time with our providers, and arriving late affects you and other patients whose appointments are after yours.  Also, if you no show three or more times for appointments you may be dismissed from the clinic at the providers discretion.     Again, thank you for choosing [facility].  Our hope is that these requests will decrease the amount of time that you wait before being seen by our physicians.       _____________________________________________________________  Should you have questions after your visit to [facility], please contact our office at [phone] between the hours of 8:00 a.m. and 4:30 p.m.  Voicemails left after 4:00 p.m. will not be returned until the following business day.  For prescription refill requests, have your pharmacy contact our office and allow 72 hours.    Cancer Center Support Programs:   > Cancer Support Group  2nd Tuesday of the month 1pm-2pm, Journey Room    

## 2021-02-14 NOTE — Progress Notes (Signed)
[facility] [address] [place], [place] [address]   CLINIC:  Medical Oncology/Hematology  PCP:  [facility] [address] [place] / [place] [address]  [phone]  REASON FOR VISIT:  Follow-up for leukopenia and thrombocytopenia  PRIOR THERAPY: None  CURRENT THERAPY: Observation  INTERVAL HISTORY:  [name], a 65 y.o. male, returns for routine follow-up for his leukopenia and thrombocytopenia. Kyle Wade was last seen on 12/01/2018.  Today he is accompanied by his son and he reports feeling well. He denies having hematuria, though he notices having blood on the toilet whenever he gets constipated, though he does not get constipated often. He denies having a history of hemorrhoids.  He continues living in [facility]. He does Meals on Wheels, cleaning his room and overall staying active.   REVIEW OF SYSTEMS:  Review of Systems  Constitutional: Negative for appetite change and fatigue.  Gastrointestinal: Positive for blood in stool (on toilet paper when constipated).  Genitourinary: Negative for hematuria.   All other systems reviewed and are negative.   PAST MEDICAL/SURGICAL HISTORY:  Past Medical History:  Diagnosis Date  . Acute cholecystitis 01/25/2016  . Anxiety   . BPH (benign prostatic hyperplasia)   . Chronic vomiting   . GERD (gastroesophageal reflux disease)   . Hypercholesteremia   . Hypertension   . Mental retardation   . OSA (obstructive sleep apnea)    no cpap machine; could not tolerate.  . Schatzki's ring   . Schizophrenia [facility])    Past Surgical History:  Procedure Laterality Date  . BIOPSY  05/14/2017   Procedure: BIOPSY;  Surgeon: [name], MD;  [facility];  Service: Endoscopy;;  gastric polyp bx  . CHOLECYSTECTOMY N/A 01/26/2016   Procedure: LAPAROSCOPIC CHOLECYSTECTOMY;  Surgeon: [name], MD;  Location: [facility] ORS;  Service: General;  Laterality: N/A;  . COLONOSCOPY N/A 09/21/2014    [name]: right-sided diverticulosis, internal hemorrhoids. Repeat in 10 years.   . COLONOSCOPY WITH PROPOFOL N/A 05/14/2017   [name]: diverticulosis in ascending colon, otherwise normal  . ESOPHAGOGASTRODUODENOSCOPY N/A 09/21/2014   [name]: Subtle non-critical Schatzki's ring s/p 56 F dilation, query occult cervical esophageal web. Hiatal hernia. Question gastroparesis.   [name] ESOPHAGOGASTRODUODENOSCOPY (EGD) WITH PROPOFOL N/A 05/14/2017   [name]: normal esophagus s/p empiric dilation, benign gastric polyps, normal duodenum  . MALONEY DILATION N/A 09/21/2014   Procedure: Elease Hashimoto DILATION;  Surgeon: [name], MD;  [facility];  Service: Endoscopy;  Laterality: N/A;  . Elease Hashimoto DILATION N/A 05/14/2017   Procedure: Elease Hashimoto DILATION;  Surgeon: [name], MD;  [facility];  Service: Endoscopy;  Laterality: N/A;  . None    . SAVORY DILATION N/A 09/21/2014   Procedure: SAVORY DILATION;  Surgeon: [name], MD;  [facility];  Service: Endoscopy;  Laterality: N/A;  . TEE WITHOUT CARDIOVERSION N/A 02/02/2018   Procedure: TRANSESOPHAGEAL ECHOCARDIOGRAM (TEE) WITH PROPOFOL;  Surgeon: [name], MD;  [facility];  Service: Cardiovascular;  Laterality: N/A;    SOCIAL HISTORY:  Social History   Socioeconomic History  . Marital status: Single    Spouse name: Not on file  . Number of children: Not on file  . Years of education: Not on file  . Highest education level: Not on file  Occupational History  . Not on file  Tobacco Use  . Smoking status: Current Every Day Smoker  Packs/day: 0.25    Years: 30.00    Pack years: 7.50    Types: Cigarettes  . Smokeless tobacco: Never Used  . Tobacco comment: 4 cigarettes a day  Vaping Use  . Vaping Use: Never used  Substance and Sexual Activity  . Alcohol use: No  . Drug use: No  . Sexual activity: Never    Birth control/protection: None  Other Topics Concern  . Not on file   Social History Narrative   Lives in Group Home.    Smokes cigarettes qd.   Able to smoke cigarettes at 8, 12, 4, 8pm.    Seen by [name] in [facility].    [facility].      Legal guardian: Armed forces logistics/support/administrative officer Department of Social services.    Social Determinants of Health   Financial Resource Strain: Not on file  Food Insecurity: Not on file  Transportation Needs: Not on file  Physical Activity: Not on file  Stress: Not on file  Social Connections: Not on file  Intimate Partner Violence: Not on file    FAMILY HISTORY:  Family History  Problem Relation Age of Onset  . Colon cancer Other        unknown  . Hypertension Other   . Hyperlipidemia Other     CURRENT MEDICATIONS:  Current Outpatient Medications  Medication Sig Dispense Refill  . ARIPiprazole (ABILIFY) 10 MG tablet Take 5 mg by mouth in the morning and at bedtime.     . ARIPiprazole (ABILIFY) 5 MG tablet Take 5 mg by mouth daily with supper.    [name] buPROPion (WELLBUTRIN SR) 150 MG 12 hr tablet Take 150 mg by mouth 2 (two) times daily.    [name] DEXILANT 60 MG capsule TAKE 1 CAPSULE BY MOUTH ONCE A DAY. 30 capsule 11  . docusate sodium (COLACE) 100 MG capsule TAKE (1) CAPSULE BY MOUTH TWICE DAILY FOR CONSTIPATION. 60 capsule 3  . ENSURE (ENSURE) USE 1 CAN BY MOUTH TWICE DAILY 2370 mL PRN  . famotidine (PEPCID) 40 MG tablet Take 40 mg by mouth at bedtime.    . fluticasone (FLONASE) 50 MCG/ACT nasal spray Place 2 sprays into both nostrils daily.    . mirtazapine (REMERON) 15 MG tablet TAKE 1 TABLET BY MOUTH DAILY 1 HOUR BEFORE BEDTIME. 30 tablet 11  . oxybutynin (DITROPAN) 5 MG tablet TAKE 1 TABLET BY MOUTH EVERY MORNING. 30 tablet 11  . polyethylene glycol powder (GLYCOLAX/MIRALAX) powder MIX 17 GRAMS IN 8 OUNCES. OF WATER AND DRINK ONCE DAILY IN THE EVENING. 527 g 11  . potassium chloride (MICRO-K) 10 MEQ CR capsule Take 10 mEq by mouth 2 (two) times daily.    . risperiDONE (RISPERDAL) 1 MG tablet Take 1  mg by mouth daily.    . tamsulosin (FLOMAX) 0.4 MG CAPS capsule TAKE 1 CAPSULE BY MOUTH ONCE DAILY 30 MINUTES AFTER THE SAME MEAL EACH DAY. 30 capsule 11  . triamterene-hydrochlorothiazide (MAXZIDE-25) 37.5-25 MG tablet TAKE (1) TABLET BY MOUTH DAILY IN THE MORNING FOR HIGH BLOOD PRESSURE. 30 tablet 11  . Vitamin D, Ergocalciferol, (DRISDOL) 50000 units CAPS capsule TAKE 1 CAPSULE BY MOUTH ONCE A MONTH ON THE 1ST. DO NOT CRUSH. 1 capsule 11   No current facility-administered medications for this visit.    ALLERGIES:  No Known Allergies  PHYSICAL EXAM:  Performance status (ECOG): 1 - Symptomatic but completely ambulatory  Vitals:   02/14/21 1101  BP: 129/78  Pulse: 68  Resp: 18  Temp: (!)  97 F (36.1 C)  SpO2: 97%   Wt Readings from Last 3 Encounters:  02/14/21 143 lb 12.8 oz (65.2 kg)  09/25/20 144 lb (65.3 kg)  03/02/20 139 lb 3.2 oz (63.1 kg)   Physical Exam Vitals reviewed.  Constitutional:      Appearance: Normal appearance.  Cardiovascular:     Rate and Rhythm: Normal rate and regular rhythm.     Pulses: Normal pulses.     Heart sounds: Normal heart sounds.  Pulmonary:     Effort: Pulmonary effort is normal.     Breath sounds: Normal breath sounds.  Neurological:     General: No focal deficit present.     Mental Status: He is alert and oriented to person, place, and time.  Psychiatric:        Mood and Affect: Mood normal.        Behavior: Behavior normal.     LABORATORY DATA:  I have reviewed the labs as listed.  CBC Latest Ref Rng & Units 02/07/2021 11/24/2018 05/14/2018  WBC 4.0 - 10.5 K/uL 3.7(L) 3.9(L) 3.5(L)  Hemoglobin 13.0 - 17.0 g/dL 25.315.1 66.415.2 40.314.1  Hematocrit 39.0 - 52.0 % 45.0 46.5 42.0  Platelets 150 - 400 K/uL 135(L) 149(L) 123(L)   CMP Latest Ref Rng & Units 02/07/2021 05/14/2018 04/08/2018  Glucose 70 - 99 mg/dL 82 474(Q120(H) 74  BUN 8 - 23 mg/dL 9 13 9   Creatinine 0.61 - 1.24 mg/dL 5.951.08 6.381.00 7.561.21  Sodium 135 - 145 mmol/L 135 133(L) 139   Potassium 3.5 - 5.1 mmol/L 3.6 3.5 4.1  Chloride 98 - 111 mmol/L 97(L) 98(L) 101  CO2 22 - 32 mmol/L 31 28 34(H)  Calcium 8.9 - 10.3 mg/dL 9.4 9.0 9.5  Total Protein 6.5 - 8.1 g/dL 7.2 7.0 7.1  Total Bilirubin 0.3 - 1.2 mg/dL 0.8 0.6 0.5  Alkaline Phos 38 - 126 U/L 57 72 -  AST 15 - 41 U/L 23 20 19   ALT 0 - 44 U/L 16 16(L) 16      Component Value Date/Time   RBC 4.92 02/07/2021 0843   MCV 91.5 02/07/2021 0843   MCH 30.7 02/07/2021 0843   MCHC 33.6 02/07/2021 0843   RDW 12.1 02/07/2021 0843   LYMPHSABS 1.2 02/07/2021 0843   MONOABS 0.4 02/07/2021 0843   EOSABS 0.0 02/07/2021 0843   BASOSABS 0.0 02/07/2021 0843   Lab Results  Component Value Date   LDH 219 (H) 02/07/2021   LDH 186 11/24/2018   LDH 201 (H) 10/30/2016    DIAGNOSTIC IMAGING:  I have independently reviewed the scans and discussed with the patient. No results found.   ASSESSMENT:  1.  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  Both thioridazine and mirtazapine are known to cause mild leukopenia and thrombocytopenia. -He denies any fevers, night sweats or weight loss in the last 6 months.  Denies any recurrent infections or hospitalizations.   PLAN:  1.  Mild leukopenia and thrombocytopenia: -He denies any fevers, night sweats or weight loss.  No recurrent infections reported. -Reviewed labs from 02/07/2021.  White count is 3.7 with normal ANC.  Platelet count is mildly low at 135.  Vitamin B12 and folic acid was normal.  LDH was minimally elevated at 219. 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 -We will continue monitoring once a year.   Orders placed this encounter:  No orders of the defined types were placed in this encounter.    Vern ClaudeSreedhar  Ellin Saba, MD [facility] [phone]   I, Drue Second, am acting as a scribe for Dr. Payton Mccallum.  I, Doreatha Massed MD, have reviewed the above documentation  for accuracy and completeness, and I agree with the above.

## 2021-02-22 ENCOUNTER — Ambulatory Visit (INDEPENDENT_AMBULATORY_CARE_PROVIDER_SITE_OTHER): Payer: Medicare Other | Admitting: Nurse Practitioner

## 2021-02-22 ENCOUNTER — Other Ambulatory Visit: Payer: Self-pay

## 2021-02-22 ENCOUNTER — Encounter: Payer: Self-pay | Admitting: Nurse Practitioner

## 2021-02-22 VITALS — BP 152/89 | HR 77 | Temp 98.2°F | Ht 67.0 in | Wt 142.4 lb

## 2021-02-22 DIAGNOSIS — K5900 Constipation, unspecified: Secondary | ICD-10-CM | POA: Diagnosis not present

## 2021-02-22 DIAGNOSIS — R634 Abnormal weight loss: Secondary | ICD-10-CM | POA: Diagnosis not present

## 2021-02-22 NOTE — Progress Notes (Signed)
Referring Provider: [facility] Primary Care Physician:  [facility] Primary GI:  [name]  Chief Complaint  Patient presents with  . Constipation    F/u, "not much"    HPI:   Kyle Wade is a 65 y.o. male who presents for follow-up.  The patient was last seen in our office 03/02/2020 for constipation and weight loss.  Noted history of impaired cognitive status (mentally challenged) and historically difficult to obtain complete history.  Acute cholecystitis in 2017 based on HIDA scan.  CTA around that time negative for mesenteric ischemia.  Status post cholecystectomy in 2017 by Dr. Lovell Sheehan.  Persistent leukopenia followed by hematology felt related to Thioridazine and recommended monitoring for potential significant agranulocytosis.  Colonoscopy and EGD up-to-date.  At his last visit he was accompanied by a friend from the group home.  Great appetite, no subjective or objective weight loss while still drinking Ensure.  On Colace and MiraLAX with good bowel movements without straining.  No other overt GI complaints.  Recommended continue current medications, continue Ensure supplements, follow-up in 1 year.  He last saw hematology/oncology on 02/15/2021 with mild leukopenia and thrombocytopenia of many years duration likely drug-induced.  White blood cell count at that time 3.7 with a normal ANC, platelet count mildly low at 135 with normal vitamin B12 and folic acid.  Recommended continue monitoring once a year.  Today states doing okay overall. Not much constipation. Continues taking Colace and MiraLAX. He isn't sure if he's had bleeding or not. Labs have been stable. Denies abdominal pain, N/V, fever, chills. Weight objectively stable. Denies URI or flu-like symptoms. Denies loss of sense of taste or smell. The patient has received COVID-19 vaccination(s). They have also had a booster dose.  Planned dental care upcoming with placement of a  partial.  Past Medical History:  Diagnosis Date  . Acute cholecystitis 01/25/2016  . Anxiety   . BPH (benign prostatic hyperplasia)   . Chronic vomiting   . GERD (gastroesophageal reflux disease)   . Hypercholesteremia   . Hypertension   . Mental retardation   . OSA (obstructive sleep apnea)    no cpap machine; could not tolerate.  . Schatzki's ring   . Schizophrenia [facility])     Past Surgical History:  Procedure Laterality Date  . BIOPSY  05/14/2017   Procedure: BIOPSY;  Surgeon: [name], MD;  [facility];  Service: Endoscopy;;  gastric polyp bx  . CHOLECYSTECTOMY N/A 01/26/2016   Procedure: LAPAROSCOPIC CHOLECYSTECTOMY;  Surgeon: [name], MD;  Location: [facility] ORS;  Service: General;  Laterality: N/A;  . COLONOSCOPY N/A 09/21/2014   [name]: right-sided diverticulosis, internal hemorrhoids. Repeat in 10 years.   . COLONOSCOPY WITH PROPOFOL N/A 05/14/2017   [name]: diverticulosis in ascending colon, otherwise normal  . ESOPHAGOGASTRODUODENOSCOPY N/A 09/21/2014   [name]: Subtle non-critical Schatzki's ring s/p 56 F dilation, query occult cervical esophageal web. Hiatal hernia. Question gastroparesis.   [name] ESOPHAGOGASTRODUODENOSCOPY (EGD) WITH PROPOFOL N/A 05/14/2017   [name]: normal esophagus s/p empiric dilation, benign gastric polyps, normal duodenum  . MALONEY DILATION N/A 09/21/2014   Procedure: Elease Hashimoto DILATION;  Surgeon: [name], MD;  [facility];  Service: Endoscopy;  Laterality: N/A;  . Elease Hashimoto DILATION N/A 05/14/2017   Procedure: Elease Hashimoto DILATION;  Surgeon: [name], MD;  [facility];  Service: Endoscopy;  Laterality: N/A;  . None    . SAVORY DILATION N/A  09/21/2014   Procedure: SAVORY DILATION;  Surgeon: [name], MD;  [facility];  Service: Endoscopy;  Laterality: N/A;  . TEE WITHOUT CARDIOVERSION N/A 02/02/2018   Procedure: TRANSESOPHAGEAL ECHOCARDIOGRAM (TEE) WITH PROPOFOL;  Surgeon: [name], MD;  Location: [facility];  Service: Cardiovascular;  Laterality: N/A;    Current Outpatient Medications  Medication Sig Dispense Refill  . ARIPiprazole (ABILIFY) 10 MG tablet Take 5 mg by mouth in the morning and at bedtime.     . ARIPiprazole (ABILIFY) 5 MG tablet Take 2.5 mg by mouth daily with supper.    [name] buPROPion (WELLBUTRIN SR) 150 MG 12 hr tablet Take 150 mg by mouth 2 (two) times daily.    [name] DEXILANT 60 MG capsule TAKE 1 CAPSULE BY MOUTH ONCE A DAY. 30 capsule 11  . docusate sodium (COLACE) 100 MG capsule TAKE (1) CAPSULE BY MOUTH TWICE DAILY FOR CONSTIPATION. 60 capsule 3  . ENSURE (ENSURE) USE 1 CAN BY MOUTH TWICE DAILY 2370 mL PRN  . famotidine (PEPCID) 40 MG tablet Take 40 mg by mouth at bedtime.    . fluticasone (FLONASE) 50 MCG/ACT nasal spray Place 2 sprays into both nostrils daily.    . mirtazapine (REMERON) 15 MG tablet TAKE 1 TABLET BY MOUTH DAILY 1 HOUR BEFORE BEDTIME. 30 tablet 11  . oxybutynin (DITROPAN) 5 MG tablet TAKE 1 TABLET BY MOUTH EVERY MORNING. 30 tablet 11  . polyethylene glycol powder (GLYCOLAX/MIRALAX) powder MIX 17 GRAMS IN 8 OUNCES. OF WATER AND DRINK ONCE DAILY IN THE EVENING. 527 g 11  . potassium chloride (MICRO-K) 10 MEQ CR capsule Take 10 mEq by mouth 2 (two) times daily.    . risperiDONE (RISPERDAL) 1 MG tablet Take 1 mg by mouth daily.    . tamsulosin (FLOMAX) 0.4 MG CAPS capsule TAKE 1 CAPSULE BY MOUTH ONCE DAILY 30 MINUTES AFTER THE SAME MEAL EACH DAY. 30 capsule 11  . triamterene-hydrochlorothiazide (MAXZIDE-25) 37.5-25 MG tablet TAKE (1) TABLET BY MOUTH DAILY IN THE MORNING FOR HIGH BLOOD PRESSURE. 30 tablet 11  . Vitamin D, Ergocalciferol, (DRISDOL) 50000 units CAPS capsule TAKE 1 CAPSULE BY MOUTH ONCE A MONTH ON THE 1ST. DO NOT CRUSH. 1 capsule 11   No current facility-administered medications for this visit.    Allergies as of 02/22/2021  . (No Known Allergies)    Family History  Problem Relation Age of Onset  . Colon  cancer Other        unknown  . Hypertension Other   . Hyperlipidemia Other     Social History   Socioeconomic History  . Marital status: Single    Spouse name: Not on file  . Number of children: Not on file  . Years of education: Not on file  . Highest education level: Not on file  Occupational History  . Not on file  Tobacco Use  . Smoking status: Current Every Day Smoker    Packs/day: 0.25    Years: 30.00    Pack years: 7.50    Types: Cigarettes  . Smokeless tobacco: Never Used  . Tobacco comment: 4 cigarettes a day  Vaping Use  . Vaping Use: Never used  Substance and Sexual Activity  . Alcohol use: No  . Drug use: No  . Sexual activity: Never    Birth control/protection: None  Other Topics Concern  . Not on file  Social History Narrative   Lives in Group Home.    Smokes cigarettes qd.  Able to smoke cigarettes at 8, 12, 4, 8pm.    [place] [place]    [facility]      Legal guardian: Armed forces logistics/support/administrative officerMelissa Price/[place] Department of Social services.    Social Determinants of Health   Financial Resource Strain: Not on file  Food Insecurity: Not on file  Transportation Needs: Not on file  Physical Activity: Not on file  Stress: Not on file  Social Connections: Not on file    Subjective: LIMITED due to cognitive impairment Review of Systems  Constitutional: Negative for chills, fever, malaise/fatigue and weight loss.  HENT: Negative for sore throat.   Respiratory: Negative for shortness of breath.   Cardiovascular: Negative for chest pain.  Gastrointestinal: Negative for abdominal pain, blood in stool, constipation, diarrhea, heartburn, melena, nausea and vomiting.  Skin: Negative for rash.  Neurological: Negative for weakness.  Endo/Heme/Allergies: Does not bruise/bleed easily.  All other systems reviewed and are negative.    Objective: BP (!) 152/89   Pulse 77   Temp 98.2 F (36.8 C) (Temporal)   Ht 5\' 7"  (1.702 m)   Wt 142 lb  6.4 oz (64.6 kg)   BMI 22.30 kg/m  Physical Exam Vitals and nursing note reviewed.  Constitutional:      General: He is not in acute distress.    Appearance: Normal appearance. He is normal weight. He is not ill-appearing, toxic-appearing or diaphoretic.  HENT:     Head: Normocephalic and atraumatic.     Nose: No congestion or rhinorrhea.  Eyes:     General: No scleral icterus. Cardiovascular:     Rate and Rhythm: Normal rate and regular rhythm.     Heart sounds: Murmur heard.   Systolic murmur is present with a grade of 3/6.   Pulmonary:     Effort: Pulmonary effort is normal.     Breath sounds: Normal breath sounds.  Abdominal:     General: Bowel sounds are normal. There is no distension.     Palpations: Abdomen is soft. There is no hepatomegaly, splenomegaly or mass.     Tenderness: There is no abdominal tenderness. There is no guarding or rebound.     Hernia: No hernia is present.  Musculoskeletal:     Cervical back: Neck supple.  Skin:    General: Skin is warm and dry.     Coloration: Skin is not jaundiced.     Findings: No bruising or rash.  Neurological:     General: No focal deficit present.     Mental Status: He is alert and oriented to person, place, and time. Mental status is at baseline.  Psychiatric:        Mood and Affect: Mood normal.        Behavior: Behavior normal.        Thought Content: Thought content normal.      Assessment:  Very pleasant 65 year old male with cognitive impairment accompanied by a home staff person for follow-up on anemia and constipation.  No red flag/warning signs or symptoms currently.  Weight loss: Objectively his weight is stable.  No further or ongoing weight loss.  Patient follows with hematology which he last saw 02/15/2021 with mild leukopenia and thrombocytopenia of many years duration likely drug-induced.  Most recent CBC with normal hemoglobin at 15.1.  No obvious bleeding, hematochezia, melena.  Recommended continued  monitoring and notify us of any recurrent weight loss..  Continue to follow-up with hematology annually based on their recommendations.  Constipation:  History of chronic constipation currently doing well on Colace and MiraLAX.  He states he is not having much constipation.  No abdominal pain, weight stable.  Recommend he continue his current medications and follow-up in a year.   Plan: 1. Continue MiraLAX and Colace 2. Continue Ensure 3. Continue other medications 4. Follow-up 1 year 5. Call for worsening symptoms    Thank you for allowing [place] to participate in the care of Kyle Wade  Wynne Dust, DNP, AGNP-C Adult & Gerontological Nurse Practitioner [facility]   02/22/2021 8:57 AM   Disclaimer: This note was dictated with voice recognition software. Similar sounding words can inadvertently be transcribed and may not be corrected upon review.

## 2021-02-22 NOTE — Patient Instructions (Signed)
Your health issues we discussed today were:   Constipation: 1. Continue using Colace and MiraLAX to help with constipation 2. Let us know if you have any worsening or severe symptoms  Weight loss: 1. I am glad your weight is doing great! 2. Continue to use Ensure to help keep your weight stable 3. Call us for any new or recurrent weight loss  Overall I recommend:  1. Continue other current medications 2. Return for follow-up 1 year 3. Call us for any questions or concerns   ---------------------------------------------------------------  I am glad you have gotten your COVID-19 vaccination!  Even though you are fully vaccinated you should continue to follow CDC and state/local guidelines.  ---------------------------------------------------------------   At [facility] we value your feedback. You may receive a survey about your visit today. Please share your experience as we strive to create trusting relationships with our patients to provide genuine, compassionate, quality care.  We appreciate your understanding and patience as we review any laboratory studies, imaging, and other diagnostic tests that are ordered as we care for you. Our office policy is 5 business days for review of these results, and any emergent or urgent results are addressed in a timely manner for your best interest. If you do not hear from our office in 1 week, please contact us.   We also encourage the use of MyChart, which contains your medical information for your review as well. If you are not enrolled in this feature, an access code is on this after visit summary for your convenience. Thank you for allowing [place] to be involved in your care.  It was great to see you today!  I hope you have a great spring!!

## 2021-05-30 ENCOUNTER — Encounter: Payer: Self-pay | Admitting: Cardiology

## 2021-05-30 ENCOUNTER — Ambulatory Visit (INDEPENDENT_AMBULATORY_CARE_PROVIDER_SITE_OTHER): Payer: Medicare Other | Admitting: Cardiology

## 2021-05-30 ENCOUNTER — Other Ambulatory Visit: Payer: Self-pay

## 2021-05-30 VITALS — BP 146/86 | HR 81 | Ht 66.0 in | Wt 143.0 lb

## 2021-05-30 DIAGNOSIS — I340 Nonrheumatic mitral (valve) insufficiency: Secondary | ICD-10-CM

## 2021-05-30 DIAGNOSIS — I10 Essential (primary) hypertension: Secondary | ICD-10-CM

## 2021-05-30 MED ORDER — AMLODIPINE BESYLATE 5 MG PO TABS: 5.0000 mg | ORAL_TABLET | Freq: Every day | ORAL | 2 refills | Status: DC

## 2021-05-30 NOTE — Progress Notes (Signed)
Clinical Summary Mr. Grosso is a 65 y.o.male seen today for follow up of the following medical problems.    1. MItral regurgitation/heart murmur - newly diagnosed heart murmur detected by pcp - echo Jan 2019 LVEF 60-65%, no WMAs, normal diastolic function, moderate to severe MR. LVIDs 19. The MV VTI/AV VTI of 1.5 would actually suggest severe   MR    - 01/2018 TEE that indicated moderate MR.  - 01/2019 TTE: moderate MR   - no recent edema. No SOB/DOE. Does daily chores without troubles.    09/2020 echo: LVEF 60-65%, moderate MR, LVIDs 2.5  - no SOB or DOE. No recent LE edema.   2. HTN - compliant with meds  3. Cognitive deficit/Mental handicap - has legal guardian   4. Schizophrenia - followed by pcp       SH: [name] with [facility] is his guardian.  Past Medical History:  Diagnosis Date   Acute cholecystitis 01/25/2016   Anxiety    BPH (benign prostatic hyperplasia)    Chronic vomiting    GERD (gastroesophageal reflux disease)    Hypercholesteremia    Hypertension    Mental retardation    OSA (obstructive sleep apnea)    no cpap machine; could not tolerate.   Schatzki's ring    Schizophrenia (HCC)      No Known Allergies   Current Outpatient Medications  Medication Sig Dispense Refill   ARIPiprazole (ABILIFY) 10 MG tablet Take 5 mg by mouth in the morning and at bedtime.      ARIPiprazole (ABILIFY) 5 MG tablet Take 2.5 mg by mouth daily with supper.     buPROPion (WELLBUTRIN SR) 150 MG 12 hr tablet Take 150 mg by mouth 2 (two) times daily.     DEXILANT 60 MG capsule TAKE 1 CAPSULE BY MOUTH ONCE A DAY. 30 capsule 11   docusate sodium (COLACE) 100 MG capsule TAKE (1) CAPSULE BY MOUTH TWICE DAILY FOR CONSTIPATION. 60 capsule 3   ENSURE (ENSURE) USE 1 CAN BY MOUTH TWICE DAILY 2370 mL PRN   famotidine (PEPCID) 40 MG tablet Take 40 mg by mouth at bedtime.     fluticasone (FLONASE) 50 MCG/ACT nasal spray Place 2 sprays into both nostrils daily.      mirtazapine (REMERON) 15 MG tablet TAKE 1 TABLET BY MOUTH DAILY 1 HOUR BEFORE BEDTIME. 30 tablet 11   oxybutynin (DITROPAN) 5 MG tablet TAKE 1 TABLET BY MOUTH EVERY MORNING. 30 tablet 11   polyethylene glycol powder (GLYCOLAX/MIRALAX) powder MIX 17 GRAMS IN 8 OUNCES. OF WATER AND DRINK ONCE DAILY IN THE EVENING. 527 g 11   potassium chloride (MICRO-K) 10 MEQ CR capsule Take 10 mEq by mouth 2 (two) times daily.     risperiDONE (RISPERDAL) 1 MG tablet Take 1 mg by mouth daily.     tamsulosin (FLOMAX) 0.4 MG CAPS capsule TAKE 1 CAPSULE BY MOUTH ONCE DAILY 30 MINUTES AFTER THE SAME MEAL EACH DAY. 30 capsule 11   triamterene-hydrochlorothiazide (MAXZIDE-25) 37.5-25 MG tablet TAKE (1) TABLET BY MOUTH DAILY IN THE MORNING FOR HIGH BLOOD PRESSURE. 30 tablet 11   Vitamin D, Ergocalciferol, (DRISDOL) 50000 units CAPS capsule TAKE 1 CAPSULE BY MOUTH ONCE A MONTH ON THE 1ST. DO NOT CRUSH. 1 capsule 11   No current facility-administered medications for this visit.     Past Surgical History:  Procedure Laterality Date   BIOPSY  05/14/2017   Procedure: BIOPSY;  Surgeon: [name], MD;  [facility];  Service: Endoscopy;;  gastric polyp bx   CHOLECYSTECTOMY N/A 01/26/2016   Procedure: LAPAROSCOPIC CHOLECYSTECTOMY;  Surgeon: [name], MD;  Location: [facility];  Service: General;  Laterality: N/A;   COLONOSCOPY N/A 09/21/2014   [name]: right-sided diverticulosis, internal hemorrhoids. Repeat in 10 years.    COLONOSCOPY WITH PROPOFOL N/A 05/14/2017   [name]: diverticulosis in ascending colon, otherwise normal   ESOPHAGOGASTRODUODENOSCOPY N/A 09/21/2014   [name]: Subtle non-critical Schatzki's ring s/p 56 F dilation, query occult cervical esophageal web. Hiatal hernia. Question gastroparesis.    ESOPHAGOGASTRODUODENOSCOPY (EGD) WITH PROPOFOL N/A 05/14/2017   [name]: normal esophagus s/p empiric dilation, benign gastric polyps, normal duodenum   MALONEY DILATION N/A 09/21/2014    Procedure: Elease Hashimoto DILATION;  Surgeon: [name], MD;  [facility];  Service: Endoscopy;  Laterality: N/A;   MALONEY DILATION N/A 05/14/2017   Procedure: Elease Hashimoto DILATION;  Surgeon: [name], MD;  [facility];  Service: Endoscopy;  Laterality: N/A;   None     SAVORY DILATION N/A 09/21/2014   Procedure: SAVORY DILATION;  Surgeon: [name], MD;  [facility];  Service: Endoscopy;  Laterality: N/A;   TEE WITHOUT CARDIOVERSION N/A 02/02/2018   Procedure: TRANSESOPHAGEAL ECHOCARDIOGRAM (TEE) WITH PROPOFOL;  Surgeon: [name], MD;  [facility];  Service: Cardiovascular;  Laterality: N/A;     No Known Allergies    Family History  Problem Relation Age of Onset   Colon cancer Other        unknown   Hypertension Other    Hyperlipidemia Other      Social History Mr. Forget reports that he has been smoking cigarettes. He has a 7.50 pack-year smoking history. He has never used smokeless tobacco. Mr. Vaneaton reports no history of alcohol use.   Review of Systems CONSTITUTIONAL: No weight loss, fever, chills, weakness or fatigue.  HEENT: Eyes: No visual loss, blurred vision, double vision or yellow sclerae.No hearing loss, sneezing, congestion, runny nose or sore throat.  SKIN: No rash or itching.  CARDIOVASCULAR: per hpi RESPIRATORY: No shortness of breath, cough or sputum.  GASTROINTESTINAL: No anorexia, nausea, vomiting or diarrhea. No abdominal pain or blood.  GENITOURINARY: No burning on urination, no polyuria NEUROLOGICAL: No headache, dizziness, syncope, paralysis, ataxia, numbness or tingling in the extremities. No change in bowel or bladder control.  MUSCULOSKELETAL: No muscle, back pain, joint pain or stiffness.  LYMPHATICS: No enlarged nodes. No history of splenectomy.  PSYCHIATRIC: No history of depression or anxiety.  ENDOCRINOLOGIC: No reports of sweating, cold or heat intolerance. No polyuria or polydipsia.   [name]   Physical Examination Today's Vitals   05/30/21 0845  BP: (!) 146/86  Pulse: 81  Weight: 143 lb (64.9 kg)  Height: 5\' 6"  (1.676 m)   Body mass index is 23.08 kg/m.  Gen: resting comfortably, no acute distress HEENT: no scleral icterus, pupils equal round and reactive, no palptable cervical adenopathy,  CV: RRR, 3/6 systolic murmur apex Resp: Clear to auscultation bilaterally GI: abdomen is soft, non-tender, non-distended, normal bowel sounds, no hepatosplenomegaly MSK: extremities are warm, no edema.  Skin: warm, no rash Neuro:  no focal deficits Psych: appropriate affect   Diagnostic Studies 01/2019 TTE   IMPRESSIONS      1. The left ventricle has normal systolic function with an ejection fraction of 60-65%. The cavity size was normal. There is mild concentric left ventricular hypertrophy. Left ventricular diastolic parameters  were normal No evidence of left ventricular regional wall motion abnormalities.  2. The right ventricle has normal systolic function. The cavity was normal. There is no increase in right ventricular wall thickness.  3. The mitral valve is myxomatous. Mild thickening of the mitral valve leaflet. Mitral valve regurgitation is moderate by color flow Doppler. Severity may be underestimated due to jet eccentricity.  4. The tricuspid valve is normal in structure.  5. The aortic valve is tricuspid.  6. The pulmonic valve was grossly normal. Pulmonic valve regurgitation is mild by color flow Doppler.  7. The aortic root is normal in size and structure.  8. No evidence of left ventricular regional wall motion abnormalities.    09/2020 echo IMPRESSIONS     1. Left ventricular ejection fraction, by estimation, is 60 to 65%. The  left ventricle has normal function. The left ventricle has no regional  wall motion abnormalities. There is moderate left ventricular hypertrophy.  Left ventricular diastolic  parameters were normal.   2. Right ventricular  systolic function is normal. The right ventricular  size is normal. There is normal pulmonary artery systolic pressure.   3. Left atrial size was mildly dilated.   4. 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 The eccentric jet is difficult to  quantify. The MV/AV VTI ratio is 1.4 suggesting moderate MR. . The mitral  valve is abnormal. Moderate mitral valve  regurgitation. No evidence of mitral stenosis.   5. The aortic valve has an indeterminant number of cusps. There is mild  calcification of the aortic valve. There is mild thickening of the aortic  valve. Aortic valve regurgitation is not visualized. No aortic stenosis is  present.   6. The inferior vena cava is normal in size with greater than 50%  respiratory variability, suggesting right atrial pressure of 3 mmHg.     Assessment and Plan  1. Mitral regurgitation - remains moderate by recent imaging, denies symptoms - continue to monitor, repeat echo later this year   2. HTN -above goal, start norvasc 5mg  daily.       , M.D.

## 2021-05-30 NOTE — Patient Instructions (Addendum)
Medication Instructions:   START  TAKING  NORVASC 5 MG ONCE DAY    Lab Work: NONE ORDERED  TODAY  If you have labs (blood work) drawn today and your tests are completely normal, you will receive your results only by: MyChart Message (if you have MyChart) OR A paper copy in the mail If you have any lab test that is abnormal or we need to change your treatment, we will call you to review the results.   Testing/Procedures: NONE ORDERED  TODAY    Follow-Up: At [facility], you and your health needs are our priority.  As part of our continuing mission to provide you with exceptional heart care, we have created designated Provider Care Teams.  These Care Teams include your primary Cardiologist (physician) and Advanced Practice Providers (APPs -  Physician Assistants and Nurse Practitioners) who all work together to provide you with the care you need, when you need it.  We recommend signing up for the patient portal called "MyChart".  Sign up information is provided on this After Visit Summary.  MyChart is used to connect with patients for Virtual Visits (Telemedicine).  Patients are able to view lab/test results, encounter notes, upcoming appointments, etc.  Non-urgent messages can be sent to your provider as well.   To learn more about what you can do with MyChart, go to [email or web address].    Your next appointment:   6 Mionths  The format for your next appointment:   In Person  Provider:   Dina Rich, MD   Other Instructions

## 2021-07-11 ENCOUNTER — Telehealth: Payer: Self-pay

## 2021-07-11 NOTE — Telephone Encounter (Signed)
Faxed a PA for Dexilant 60 mg to [facility]. Waiting to get into the [facility] Tracks system. Waiting on a response.

## 2021-08-14 ENCOUNTER — Telehealth: Payer: Self-pay

## 2021-08-14 NOTE — Telephone Encounter (Signed)
Tried to get the pt's Dexilant authorized again because it keeps saying on Cover My Meds "pt cannot be identified", and nothing has changed for the pt since 2017.( Meaning demographics). This has been going on for some time now. Faxed a paper copy of a PA to [facility] to see if that would make a difference. Waiting on a response.

## 2021-08-15 NOTE — Telephone Encounter (Signed)
Pt has been approved for Dexilant 60 mg CAP DR BP. Approval is from July 15, 2021 until August 14, 2022. Will fax the pharmacy the approval note. This pt's PA had to be done by fax, Cover My Meds system could not identify the pt.

## 2021-11-19 ENCOUNTER — Telehealth: Payer: Self-pay | Admitting: Gastroenterology

## 2021-11-19 NOTE — Telephone Encounter (Signed)
Hey Dr. Tomasa Rand,   Patient Legal Guardian Social Worker [name] called in for patient for transfer of care. Patient is needing to be seen for GI bleed and rapid weight loss within 6 months. He is a patient of [facility] last seen 02/2021. He is wanting to transfer because they can't see him till 03/2022. His records are in Epic. Could you please review and advise?  Thank you

## 2021-12-12 ENCOUNTER — Encounter: Payer: Self-pay | Admitting: Gastroenterology

## 2021-12-12 NOTE — Telephone Encounter (Signed)
Patient scheduled for 01/09/2022. Will cancel OV with [facility]

## 2022-01-09 ENCOUNTER — Ambulatory Visit (INDEPENDENT_AMBULATORY_CARE_PROVIDER_SITE_OTHER): Payer: Medicare Other | Admitting: Gastroenterology

## 2022-01-09 ENCOUNTER — Encounter: Payer: Self-pay | Admitting: Gastroenterology

## 2022-01-09 ENCOUNTER — Other Ambulatory Visit (INDEPENDENT_AMBULATORY_CARE_PROVIDER_SITE_OTHER): Payer: Medicare Other

## 2022-01-09 VITALS — BP 136/82 | HR 85 | Ht 66.0 in | Wt 136.2 lb

## 2022-01-09 DIAGNOSIS — R1084 Generalized abdominal pain: Secondary | ICD-10-CM

## 2022-01-09 DIAGNOSIS — K921 Melena: Secondary | ICD-10-CM

## 2022-01-09 DIAGNOSIS — K5900 Constipation, unspecified: Secondary | ICD-10-CM | POA: Diagnosis not present

## 2022-01-09 LAB — CBC WITH DIFFERENTIAL/PLATELET
Basophils Absolute: 0 10*3/uL (ref 0.0–0.1)
Basophils Relative: 0.4 % (ref 0.0–3.0)
Eosinophils Absolute: 0 10*3/uL (ref 0.0–0.7)
Eosinophils Relative: 0.9 % (ref 0.0–5.0)
HCT: 42.8 % (ref 39.0–52.0)
Hemoglobin: 14.3 g/dL (ref 13.0–17.0)
Lymphocytes Relative: 31.1 % (ref 12.0–46.0)
Lymphs Abs: 1.4 10*3/uL (ref 0.7–4.0)
MCHC: 33.4 g/dL (ref 30.0–36.0)
MCV: 90.5 fl (ref 78.0–100.0)
Monocytes Absolute: 0.4 10*3/uL (ref 0.1–1.0)
Monocytes Relative: 9.3 % (ref 3.0–12.0)
Neutro Abs: 2.6 10*3/uL (ref 1.4–7.7)
Neutrophils Relative %: 58.3 % (ref 43.0–77.0)
Platelets: 114 10*3/uL — ABNORMAL LOW (ref 150.0–400.0)
RBC: 4.73 Mil/uL (ref 4.22–5.81)
RDW: 13 % (ref 11.5–15.5)
WBC: 4.4 10*3/uL (ref 4.0–10.5)

## 2022-01-09 LAB — COMPREHENSIVE METABOLIC PANEL
ALT: 24 U/L (ref 0–53)
AST: 18 U/L (ref 0–37)
Albumin: 4.4 g/dL (ref 3.5–5.2)
Alkaline Phosphatase: 66 U/L (ref 39–117)
BUN: 8 mg/dL (ref 6–23)
CO2: 36 mEq/L — ABNORMAL HIGH (ref 19–32)
Calcium: 9.9 mg/dL (ref 8.4–10.5)
Chloride: 97 mEq/L (ref 96–112)
Creatinine, Ser: 1.03 mg/dL (ref 0.40–1.50)
GFR: 76.17 mL/min (ref >60.00)
Glucose, Bld: 75 mg/dL (ref 70–99)
Potassium: 3.6 mEq/L (ref 3.5–5.1)
Sodium: 137 mEq/L (ref 135–145)
Total Bilirubin: 0.5 mg/dL (ref 0.2–1.2)
Total Protein: 7.4 g/dL (ref 6.0–8.3)

## 2022-01-09 LAB — LIPASE: Lipase: 24 U/L (ref 11.0–59.0)

## 2022-01-09 MED ORDER — DICYCLOMINE HCL 20 MG PO TABS: 20.0000 mg | ORAL_TABLET | Freq: Four times a day (QID) | ORAL | 1 refills | Status: DC | PRN

## 2022-01-09 NOTE — Progress Notes (Signed)
HPI : Kyle Wade is a very pleasant 66 year old male with a history of mental impairment, anxiety and schizophrenia who is referred to [place] by [facility] for further evaluation of chronic abdominal pain, hematochezia and weight loss.  He also has a history of GERD and dysphagia secondary to a Schatzki ring.  He was previously followed by [facility] ([name]).  He had undergone an EGD and colonoscopy in 2018 to evaluate dysphagia and weight loss.  The colonoscopy was normal except for diverticulosis of the ascending colon (a colonoscopy in 2015 had reported the presence of internal hemorrhoids).  The EGD was notable for a small hiatal hernia and questionable ring, dilated with a 54 Fr Maloney without rent formation.   The patient is accompanied by his group home manager who helps provide some of the history, as the patient is a poor historian.  The manager reports that the patient is often found to be wincing, or bending over in pain, particularly with bowel movements.  She reports that others have reported the presence of blood in the stool, although she herself has not seen blood.  He has reported burning pain in the rectal area around the time of bowel movements.  His stools are typically soft and small.  He does struggle with constipation and is currently taking Miralax once a day as well as colace. Diarrhea is not a typical problem for him No nausea or vomiting.  He denies symptoms of heartburn or acid reflux.  He takes Dexilant daily for GERD. No recent problems with dysphagia.  His appetite is good.  His weight today is down about 6-7lbs from earlier in the year.    Past Medical History:  Diagnosis Date   Acute cholecystitis 01/25/2016   Anxiety    BPH (benign prostatic hyperplasia)    Chronic vomiting    GERD (gastroesophageal reflux disease)    Hypercholesteremia    Hypertension    Mental retardation    OSA (obstructive sleep apnea)    no cpap machine; could  not tolerate.   Schatzki's ring    Schizophrenia [facility])      Past Surgical History:  Procedure Laterality Date   BIOPSY  05/14/2017   Procedure: BIOPSY;  Surgeon: [name], MD;  [facility];  Service: Endoscopy;;  gastric polyp bx   CHOLECYSTECTOMY N/A 01/26/2016   Procedure: LAPAROSCOPIC CHOLECYSTECTOMY;  Surgeon: [name], MD;  Location[facility];  Service: General;  Laterality: N/A;   COLONOSCOPY N/A 09/21/2014   [name]: right-sided diverticulosis, internal hemorrhoids. Repeat in 10 years.    COLONOSCOPY WITH PROPOFOL N/A 05/14/2017   [name]: diverticulosis in ascending colon, otherwise normal   ESOPHAGOGASTRODUODENOSCOPY N/A 09/21/2014   [name]: Subtle non-critical Schatzki's ring s/p 56 F dilation, query occult cervical esophageal web. Hiatal hernia. Question gastroparesis.    ESOPHAGOGASTRODUODENOSCOPY (EGD) WITH PROPOFOL N/A 05/14/2017   [name]: normal esophagus s/p empiric dilation, benign gastric polyps, normal duodenum   MALONEY DILATION N/A 09/21/2014   Procedure: Elease Hashimoto DILATION;  Surgeon: [name], MD;  [facility];  Service: Endoscopy;  Laterality: N/A;   MALONEY DILATION N/A 05/14/2017   Procedure: Elease Hashimoto DILATION;  Surgeon: [name], MD;  [facility];  Service: Endoscopy;  Laterality: N/A;   None     SAVORY DILATION N/A 09/21/2014   Procedure: SAVORY DILATION;  Surgeon: [name], MD;  [facility];  Service: Endoscopy;  Laterality: N/A;   TEE WITHOUT CARDIOVERSION N/A 02/02/2018   Procedure: TRANSESOPHAGEAL ECHOCARDIOGRAM (TEE) WITH PROPOFOL;  Surgeon: [name], MD;  [facility];  Service: Cardiovascular;  Laterality: N/A;   Colonoscopy May 2018 to evaluate weight loss, performed by [name]: Diverticulosis of ascending colon, otherwise normal, recommended repeat 10 years  EGD May 2018 to evaluate weight loss and dysphagia: Small hiatal hernia, multiple small gastric polyps,  normal esophagus dilated with 54 Jerene Dilling, no mucosal rent  Colonoscopy October 2015: Average risk screening, performed by [name]: Diverticulosis of ascending colon, internal hemorrhoids, otherwise normal, recommend repeat 10 years  EGD October 2015: Dysphagia, dyspepsia, performed by [name]: 3 cm hiatal hernia, questionable Schatzki ring, dilated with 56 [place] Maloney with small rent, some gastric contents remain, query gastroparesis   Family History  Problem Relation Age of Onset   Colon cancer Other        unknown   Hypertension Other    Hyperlipidemia Other    Social History   Tobacco Use   Smoking status: Every Day    Packs/day: 0.25    Years: 30.00    Pack years: 7.50    Types: Cigarettes   Smokeless tobacco: Never   Tobacco comments:    4 cigarettes a day  Vaping Use   Vaping Use: Never used  Substance Use Topics   Alcohol use: No   Drug use: No   Current Outpatient Medications  Medication Sig Dispense Refill   ARIPiprazole (ABILIFY) 10 MG tablet Take 5 mg by mouth in the morning and at bedtime.      ARIPiprazole (ABILIFY) 5 MG tablet Take 2.5 mg by mouth daily with supper.     buPROPion (WELLBUTRIN SR) 150 MG 12 hr tablet Take 150 mg by mouth 2 (two) times daily.     chlorhexidine (PERIDEX) 0.12 % solution Use as directed 15 mLs in the mouth or throat 2 (two) times daily.     DEXILANT 60 MG capsule TAKE 1 CAPSULE BY MOUTH ONCE A DAY. 30 capsule 11   docusate sodium (COLACE) 100 MG capsule TAKE (1) CAPSULE BY MOUTH TWICE DAILY FOR CONSTIPATION. 60 capsule 3   ENSURE (ENSURE) USE 1 CAN BY MOUTH TWICE DAILY 2370 mL PRN   famotidine (PEPCID) 40 MG tablet Take 40 mg by mouth at bedtime.     fluticasone (FLONASE) 50 MCG/ACT nasal spray Place 2 sprays into both nostrils daily.     mirtazapine (REMERON) 15 MG tablet TAKE 1 TABLET BY MOUTH DAILY 1 HOUR BEFORE BEDTIME. 30 tablet 11   oxybutynin (DITROPAN) 5 MG tablet TAKE 1 TABLET BY MOUTH EVERY MORNING. 30  tablet 11   polyethylene glycol (GAVILAX) 17 g packet Take 17 g by mouth daily. Mix 17 grams in 8 ounces of water and drink once.     potassium chloride (MICRO-K) 10 MEQ CR capsule Take 10 mEq by mouth 2 (two) times daily.     risperiDONE (RISPERDAL) 1 MG tablet Take 1 mg by mouth daily.     tamsulosin (FLOMAX) 0.4 MG CAPS capsule TAKE 1 CAPSULE BY MOUTH ONCE DAILY 30 MINUTES AFTER THE SAME MEAL EACH DAY. 30 capsule 11   triamterene-hydrochlorothiazide (MAXZIDE-25) 37.5-25 MG tablet TAKE (1) TABLET BY MOUTH DAILY IN THE MORNING FOR HIGH BLOOD PRESSURE. 30 tablet 11   Vitamin D, Ergocalciferol, (DRISDOL) 50000 units CAPS capsule TAKE 1 CAPSULE BY MOUTH ONCE A MONTH ON THE 1ST. DO NOT CRUSH. 1 capsule 11  amLODipine (NORVASC) 5 MG tablet Take 1 tablet (5 mg total) by mouth daily. 90 tablet 2   No current facility-administered medications for this visit.   No Known Allergies   Review of Systems: All systems reviewed and negative except where noted in HPI.    No results found.  Physical Exam: BP 136/82    Pulse 85    Ht  (1.676 m)    Wt 136 lb 4 oz (61.8 kg)    SpO2 98%    BMI 21.99 kg/m  Constitutional: Pleasant,well-developed, African American male in no acute distress.  Accompanied by group home manager HEENT: Normocephalic and atraumatic. Conjunctivae are normal. No scleral icterus.  Many missing teeth Cardiovascular: Normal rate, regular rhythm.  Pulmonary/chest: Effort normal and breath sounds normal. No wheezing, rales or rhonchi. Abdominal: Soft, nondistended, nontender. Bowel sounds active throughout. There are no masses palpable. No hepatomegaly. Extremities: no edema Rectal: Normal perianal exam except for small skin tag.  No large external hemorrhoid, no anal fissure.  Digital rectal exam normal with normal sphcinter tone, no masses or large internal hemorrhoids palpalble Neurological: Alert and interactive, but not able to answer all questions appropriately Skin: Skin is  warm and dry. No rashes noted. Psychiatric: Normal mood and affect. Behavior is normal.  CBC    Component Value Date/Time   WBC 3.7 (L) 02/07/2021 0843   RBC 4.92 02/07/2021 0843   HGB 15.1 02/07/2021 0843   HCT 45.0 02/07/2021 0843   HCT 44.3 10/30/2016 1143   PLT 135 (L) 02/07/2021 0843   MCV 91.5 02/07/2021 0843   MCH 30.7 02/07/2021 0843   MCHC 33.6 02/07/2021 0843   RDW 12.1 02/07/2021 0843   LYMPHSABS 1.2 02/07/2021 0843   MONOABS 0.4 02/07/2021 0843   EOSABS 0.0 02/07/2021 0843   BASOSABS 0.0 02/07/2021 0843    CMP     Component Value Date/Time   NA 135 02/07/2021 0843   K 3.6 02/07/2021 0843   CL 97 (L) 02/07/2021 0843   CO2 31 02/07/2021 0843   GLUCOSE 82 02/07/2021 0843   BUN 9 02/07/2021 0843   CREATININE 1.08 02/07/2021 0843   CREATININE 1.21 04/08/2018 1052   CALCIUM 9.4 02/07/2021 0843   PROT 7.2 02/07/2021 0843   ALBUMIN 4.2 02/07/2021 0843   AST 23 02/07/2021 0843   ALT 16 02/07/2021 0843   ALKPHOS 57 02/07/2021 0843   BILITOT 0.8 02/07/2021 0843   GFRNONAA >60 02/07/2021 0843   GFRNONAA 64 04/08/2018 1052   GFRAA >60 05/14/2018 1405   GFRAA 74 04/08/2018 1052     ASSESSMENT AND PLAN: 66 year old male with mental impairment, with chronic abdominal pain, constipation, hematochezia and a history of GERD/Schatzki ring.  He had an EGD and colonoscopy 4 years ago to evaluate weight loss which was unremarkable.  I have a low degree of suspicion that his weight loss is due to an underlying GI problem or malignancy and I don't think repeat endoscopy is going to be fruitful.  I suspect his abdominal pain will probably improve with improvement in his bowel habits and suggested fiber supplementation with metamucil to improve the stool bulk and consistency.  His caretaker said that she thinks she remembers him being on Metamucil in the past and it working well for him.  I would stop the Miralax when he starts the metamucil, but he can take both if he needs to.   Will try Bentyl as needed for his crampy abdominal pain episodes. I would  like to recheck a CBC, CMP and lipase.  If his hgb has had a significant drop, we may reconsider an endoscopic evaluation.  Constipation/abdominal pain - Metamucil daily - Stop miralax  - Bentyl 20 mg PO q6 hr PRN  Hematochezia - Consistent with hemorrhoidal bleeding - No need for repeat colonoscopy given normal colon in 2018 - CBC, CMP, lipase  Weight loss (6-7lbs) - Patient has already had endoscopic evaluation for this which was normal  Ryanna Teschner E. Tomasa Rand, MD [facility]    [facility] [place]

## 2022-01-09 NOTE — Patient Instructions (Signed)
If you are age 66 or older, your body mass index should be between 23-30. Your Body mass index is 21.99 kg/m. If this is out of the aforementioned range listed, please consider follow up with your Primary Care Provider.  If you are age 39 or younger, your body mass index should be between 19-25. Your Body mass index is 21.99 kg/m. If this is out of the aformentioned range listed, please consider follow up with your Primary Care Provider.   ________________________________________________________  The [facility] providers would like to encourage you to use [facility] to communicate with providers for non-urgent requests or questions.  Due to long hold times on the telephone, sending your provider a message by [facility] may be a faster and more efficient way to get a response.  Please allow 48 business hours for a response.  Please remember that this is for non-urgent requests.  _______________________________________________________  Due to recent changes in healthcare laws, you may see the results of your imaging and laboratory studies on MyChart before your provider has had a chance to review them.  We understand that in some cases there may be results that are confusing or concerning to you. Not all laboratory results come back in the same time frame and the provider may be waiting for multiple results in order to interpret others.  Please give [place] 48 hours in order for your provider to thoroughly review all the results before contacting the office for clarification of your results.    Your provider has requested that you go to the basement level for lab work before leaving today. Press "B" on the elevator. The lab is located at the first door on the left as you exit the elevator.   Please Start Metamucil 1 packet daily Stop Miralax  We have sent the following medications to your pharmacy for you to pick up at your convenience: Bentyl  Thank you for choosing me and [facility].  Scott  E. Tomasa Rand, MD.

## 2022-01-10 ENCOUNTER — Encounter: Payer: Self-pay | Admitting: Gastroenterology

## 2022-01-16 NOTE — Progress Notes (Signed)
Maya,  Can you please inform Kyle Wade (via his caretaker) that his labs looked good.  His blood counts, electrolytes, liver enzymes and pancreas enzymes were all normal.

## 2022-01-18 ENCOUNTER — Ambulatory Visit (INDEPENDENT_AMBULATORY_CARE_PROVIDER_SITE_OTHER): Payer: Medicare Other | Admitting: Cardiology

## 2022-01-18 ENCOUNTER — Encounter: Payer: Self-pay | Admitting: Cardiology

## 2022-01-18 ENCOUNTER — Other Ambulatory Visit: Payer: Self-pay

## 2022-01-18 VITALS — BP 132/61 | HR 70 | Ht 66.0 in | Wt 140.6 lb

## 2022-01-18 DIAGNOSIS — I10 Essential (primary) hypertension: Secondary | ICD-10-CM | POA: Diagnosis not present

## 2022-01-18 DIAGNOSIS — I340 Nonrheumatic mitral (valve) insufficiency: Secondary | ICD-10-CM | POA: Diagnosis not present

## 2022-01-18 NOTE — Progress Notes (Signed)
Clinical Summary Kyle Wade is a 66 y.o.male seen today for follow up of the following medical problems.    1. MItral regurgitation/heart murmur - newly diagnosed heart murmur detected by pcp - echo Jan 2019 LVEF 60-65%, no WMAs, normal diastolic function, moderate to severe MR. LVIDs 19. The MV VTI/AV VTI of 1.5 would actually suggest severe   MR    - 01/2018 TEE that indicated moderate MR.  - 01/2019 TTE: moderate MR   - no recent edema. No SOB/DOE. Does daily chores without troubles.    09/2020 echo: LVEF 60-65%, moderate MR, LVIDs 2.5   - no significant SOB/DOE. No LE edema.    2. HTN - he is compliant with meds   3. Cognitive deficit/Mental handicap - has legal guardian   4. Schizophrenia - followed by pcp       SH: Kyle Wade with [place] [employer] is his guardian.    Past Medical History:  Diagnosis Date   Acute cholecystitis 01/25/2016   Anxiety    BPH (benign prostatic hyperplasia)    Chronic vomiting    GERD (gastroesophageal reflux disease)    Hypercholesteremia    Hypertension    Mental retardation    OSA (obstructive sleep apnea)    no cpap machine; could not tolerate.   Schatzki's ring    Schizophrenia (HCC)      No Known Allergies   Current Outpatient Medications  Medication Sig Dispense Refill   amLODipine (NORVASC) 5 MG tablet Take 1 tablet (5 mg total) by mouth daily. 90 tablet 2   ARIPiprazole (ABILIFY) 10 MG tablet Take 5 mg by mouth in the morning and at bedtime.      ARIPiprazole (ABILIFY) 5 MG tablet Take 2.5 mg by mouth daily with supper.     buPROPion (WELLBUTRIN SR) 150 MG 12 hr tablet Take 150 mg by mouth 2 (two) times daily.     chlorhexidine (PERIDEX) 0.12 % solution Use as directed 15 mLs in the mouth or throat 2 (two) times daily.     DEXILANT 60 MG capsule TAKE 1 CAPSULE BY MOUTH ONCE A DAY. 30 capsule 11   dicyclomine (BENTYL) 20 MG tablet Take 1 tablet (20 mg total) by mouth every 6 (six) hours as needed for spasms. 30  tablet 1   docusate sodium (COLACE) 100 MG capsule TAKE (1) CAPSULE BY MOUTH TWICE DAILY FOR CONSTIPATION. 60 capsule 3   ENSURE (ENSURE) USE 1 CAN BY MOUTH TWICE DAILY 2370 mL PRN   famotidine (PEPCID) 40 MG tablet Take 40 mg by mouth at bedtime.     fluticasone (FLONASE) 50 MCG/ACT nasal spray Place 2 sprays into both nostrils daily.     mirtazapine (REMERON) 15 MG tablet TAKE 1 TABLET BY MOUTH DAILY 1 HOUR BEFORE BEDTIME. 30 tablet 11   oxybutynin (DITROPAN) 5 MG tablet TAKE 1 TABLET BY MOUTH EVERY MORNING. 30 tablet 11   polyethylene glycol (GAVILAX) 17 g packet Take 17 g by mouth daily. Mix 17 grams in 8 ounces of water and drink once.     potassium chloride (MICRO-K) 10 MEQ CR capsule Take 10 mEq by mouth 2 (two) times daily.     risperiDONE (RISPERDAL) 1 MG tablet Take 1 mg by mouth daily.     tamsulosin (FLOMAX) 0.4 MG CAPS capsule TAKE 1 CAPSULE BY MOUTH ONCE DAILY 30 MINUTES AFTER THE SAME MEAL EACH DAY. 30 capsule 11   triamterene-hydrochlorothiazide (MAXZIDE-25) 37.5-25 MG tablet TAKE (1) TABLET BY MOUTH  DAILY IN THE MORNING FOR HIGH BLOOD PRESSURE. 30 tablet 11   Vitamin D, Ergocalciferol, (DRISDOL) 50000 units CAPS capsule TAKE 1 CAPSULE BY MOUTH ONCE A MONTH ON THE 1ST. DO NOT CRUSH. 1 capsule 11   No current facility-administered medications for this visit.     Past Surgical History:  Procedure Laterality Date   BIOPSY  05/14/2017   Procedure: BIOPSY;  Surgeon: Kyle Ade, MD;  [facility];  Service: Endoscopy;;  gastric polyp bx   CHOLECYSTECTOMY N/A 01/26/2016   Procedure: LAPAROSCOPIC CHOLECYSTECTOMY;  Surgeon: Kyle Macho, MD;  Location: [facility];  Service: General;  Laterality: N/A;   COLONOSCOPY N/A 09/21/2014   Dr. Jena Wade: right-sided diverticulosis, internal hemorrhoids. Repeat in 10 years.    COLONOSCOPY WITH PROPOFOL N/A 05/14/2017   Dr. Jena Wade: diverticulosis in ascending colon, otherwise normal   ESOPHAGOGASTRODUODENOSCOPY N/A 09/21/2014   Dr. Jena Wade:  Subtle non-critical Schatzki's ring s/p 56 F dilation, query occult cervical esophageal web. Hiatal hernia. Question gastroparesis.    ESOPHAGOGASTRODUODENOSCOPY (EGD) WITH PROPOFOL N/A 05/14/2017   Dr. Jena Wade: normal esophagus s/p empiric dilation, benign gastric polyps, normal duodenum   MALONEY DILATION N/A 09/21/2014   Procedure: Kyle Wade DILATION;  Surgeon: Kyle Ade, MD;  [facility];  Service: Endoscopy;  Laterality: N/A;   MALONEY DILATION N/A 05/14/2017   Procedure: Kyle Wade DILATION;  Surgeon: Kyle Ade, MD;  [facility];  Service: Endoscopy;  Laterality: N/A;   None     SAVORY DILATION N/A 09/21/2014   Procedure: SAVORY DILATION;  Surgeon: Kyle Ade, MD;  [facility];  Service: Endoscopy;  Laterality: N/A;   TEE WITHOUT CARDIOVERSION N/A 02/02/2018   Procedure: TRANSESOPHAGEAL ECHOCARDIOGRAM (TEE) WITH PROPOFOL;  Surgeon: Kyle Riffle, MD;  [facility];  Service: Cardiovascular;  Laterality: N/A;     No Known Allergies    Family History  Problem Relation Age of Onset   Colon cancer Other        unknown   Hypertension Other    Hyperlipidemia Other      Social History [name] reports that he has been smoking cigarettes. He has a 7.50 pack-year smoking history. He has never used smokeless tobacco. [name] reports no history of alcohol use.   Review of Systems CONSTITUTIONAL: No weight loss, fever, chills, weakness or fatigue.  HEENT: Eyes: No visual loss, blurred vision, double vision or yellow sclerae.No hearing loss, sneezing, congestion, runny nose or sore throat.  SKIN: No rash or itching.  CARDIOVASCULAR: per hpi RESPIRATORY: No shortness of breath, cough or sputum.  GASTROINTESTINAL: No anorexia, nausea, vomiting or diarrhea. No abdominal pain or blood.  GENITOURINARY: No burning on urination, no polyuria NEUROLOGICAL: No headache, dizziness, syncope, paralysis, ataxia, numbness or tingling in the  extremities. No change in bowel or bladder control.  MUSCULOSKELETAL: No muscle, back pain, joint pain or stiffness.  LYMPHATICS: No enlarged nodes. No history of splenectomy.  PSYCHIATRIC: No history of depression or anxiety.  ENDOCRINOLOGIC: No reports of sweating, cold or heat intolerance. No polyuria or polydipsia.  [name]   Physical Examination Today's Vitals   01/18/22 1033  BP: 132/61  Pulse: 70  SpO2: 97%  Weight: 140 lb 9.6 oz (63.8 kg)  Height: 5\' 6"  (1.676 m)   Body mass index is 22.69 kg/m.  Gen: resting comfortably, no acute distress HEENT: no scleral icterus, pupils equal round and reactive, no palptable cervical adenopathy,  CV: RRR, 3/6 systolic murmur, no  jvd Resp: Clear to auscultation bilaterally GI: abdomen is soft, non-tender, non-distended, normal bowel sounds, no hepatosplenomegaly MSK: extremities are warm, no edema.  Skin: warm, no rash Neuro:  no focal deficits Psych: appropriate affect   Diagnostic Studies  01/2019 TTE   IMPRESSIONS      1. The left ventricle has normal systolic function with an ejection fraction of 60-65%. The cavity size was normal. There is mild concentric left ventricular hypertrophy. Left ventricular diastolic parameters were normal No evidence of left ventricular regional wall motion abnormalities.  2. The right ventricle has normal systolic function. The cavity was normal. There is no increase in right ventricular wall thickness.  3. The mitral valve is myxomatous. Mild thickening of the mitral valve leaflet. Mitral valve regurgitation is moderate by color flow Doppler. Severity may be underestimated due to jet eccentricity.  4. The tricuspid valve is normal in structure.  5. The aortic valve is tricuspid.  6. The pulmonic valve was grossly normal. Pulmonic valve regurgitation is mild by color flow Doppler.  7. The aortic root is normal in size and structure.  8. No evidence of left ventricular regional wall motion  abnormalities.     09/2020 echo IMPRESSIONS     1. Left ventricular ejection fraction, by estimation, is 60 to 65%. The  left ventricle has normal function. The left ventricle has no regional  wall motion abnormalities. There is moderate left ventricular hypertrophy.  Left ventricular diastolic  parameters were normal.   2. Right ventricular systolic function is normal. The right ventricular  size is normal. There is normal pulmonary artery systolic pressure.   3. Left atrial size was mildly dilated.   4. 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 The eccentric jet is difficult to  quantify. The MV/AV VTI ratio is 1.4 suggesting moderate MR. . The mitral  valve is abnormal. Moderate mitral valve  regurgitation. No evidence of mitral stenosis.   5. The aortic valve has an indeterminant number of cusps. There is mild  calcification of the aortic valve. There is mild thickening of the aortic  valve. Aortic valve regurgitation is not visualized. No aortic stenosis is  present.   6. The inferior vena cava is normal in size with greater than 50%  respiratory variability, suggesting right atrial pressure of 3 mmHg.        Assessment and Plan   1. Mitral regurgitation - moderate MR by last imaging, no recent symptoms - will repeat echo fall of 2023   2. HTN -he is at goal, continue current meds  F/u 6 months     Antoine Poche, M.D.

## 2022-01-18 NOTE — Patient Instructions (Signed)
Medication Instructions:  Your physician recommends that you continue on your current medications as directed. Please refer to the Current Medication list given to you today.   Labwork: None today  Testing/Procedures: None today  Follow-Up: 6 months  Any Other Special Instructions Will Be Listed Below (If Applicable).  If you need a refill on your cardiac medications before your next appointment, please call your pharmacy.  

## 2022-02-04 ENCOUNTER — Other Ambulatory Visit: Payer: Self-pay | Admitting: Cardiology

## 2022-02-20 ENCOUNTER — Inpatient Hospital Stay (HOSPITAL_COMMUNITY): Payer: Medicare Other | Attending: Hematology

## 2022-02-20 DIAGNOSIS — Z800 Family history of malignant neoplasm of digestive organs: Secondary | ICD-10-CM | POA: Insufficient documentation

## 2022-02-20 DIAGNOSIS — F17210 Nicotine dependence, cigarettes, uncomplicated: Secondary | ICD-10-CM | POA: Insufficient documentation

## 2022-02-20 DIAGNOSIS — I10 Essential (primary) hypertension: Secondary | ICD-10-CM | POA: Insufficient documentation

## 2022-02-20 DIAGNOSIS — D72819 Decreased white blood cell count, unspecified: Secondary | ICD-10-CM | POA: Insufficient documentation

## 2022-02-20 DIAGNOSIS — D696 Thrombocytopenia, unspecified: Secondary | ICD-10-CM | POA: Insufficient documentation

## 2022-02-21 ENCOUNTER — Other Ambulatory Visit: Payer: Self-pay | Admitting: *Deleted

## 2022-02-21 DIAGNOSIS — D696 Thrombocytopenia, unspecified: Secondary | ICD-10-CM

## 2022-02-21 DIAGNOSIS — D500 Iron deficiency anemia secondary to blood loss (chronic): Secondary | ICD-10-CM

## 2022-02-21 DIAGNOSIS — D72819 Decreased white blood cell count, unspecified: Secondary | ICD-10-CM

## 2022-02-22 ENCOUNTER — Inpatient Hospital Stay (HOSPITAL_COMMUNITY): Payer: Medicare Other

## 2022-02-22 DIAGNOSIS — D72819 Decreased white blood cell count, unspecified: Secondary | ICD-10-CM

## 2022-02-22 DIAGNOSIS — I10 Essential (primary) hypertension: Secondary | ICD-10-CM | POA: Diagnosis not present

## 2022-02-22 DIAGNOSIS — D696 Thrombocytopenia, unspecified: Secondary | ICD-10-CM

## 2022-02-22 DIAGNOSIS — D500 Iron deficiency anemia secondary to blood loss (chronic): Secondary | ICD-10-CM

## 2022-02-22 DIAGNOSIS — F17210 Nicotine dependence, cigarettes, uncomplicated: Secondary | ICD-10-CM | POA: Diagnosis not present

## 2022-02-22 DIAGNOSIS — Z800 Family history of malignant neoplasm of digestive organs: Secondary | ICD-10-CM | POA: Diagnosis not present

## 2022-02-22 LAB — CBC WITH DIFFERENTIAL/PLATELET
Abs Immature Granulocytes: 0 10*3/uL (ref 0.00–0.07)
Basophils Absolute: 0 10*3/uL (ref 0.0–0.1)
Basophils Relative: 0 %
Eosinophils Absolute: 0.1 10*3/uL (ref 0.0–0.5)
Eosinophils Relative: 1 %
HCT: 44.4 % (ref 39.0–52.0)
Hemoglobin: 14.5 g/dL (ref 13.0–17.0)
Immature Granulocytes: 0 %
Lymphocytes Relative: 29 %
Lymphs Abs: 1.1 10*3/uL (ref 0.7–4.0)
MCH: 30 pg (ref 26.0–34.0)
MCHC: 32.7 g/dL (ref 30.0–36.0)
MCV: 91.9 fL (ref 80.0–100.0)
Monocytes Absolute: 0.4 10*3/uL (ref 0.1–1.0)
Monocytes Relative: 9 %
Neutro Abs: 2.4 10*3/uL (ref 1.7–7.7)
Neutrophils Relative %: 61 %
Platelets: 123 10*3/uL — ABNORMAL LOW (ref 150–400)
RBC: 4.83 MIL/uL (ref 4.22–5.81)
RDW: 12.1 % (ref 11.5–15.5)
WBC: 3.9 10*3/uL — ABNORMAL LOW (ref 4.0–10.5)
nRBC: 0 % (ref 0.0–0.2)

## 2022-02-22 LAB — IRON AND TIBC
Iron: 93 ug/dL (ref 45–182)
Saturation Ratios: 27 % (ref 17.9–39.5)
TIBC: 347 ug/dL (ref 250–450)
UIBC: 254 ug/dL

## 2022-02-22 LAB — LACTATE DEHYDROGENASE: LDH: 187 U/L (ref 98–192)

## 2022-02-22 LAB — FERRITIN: Ferritin: 96 ng/mL (ref 24–336)

## 2022-02-22 LAB — FOLATE: Folate: 13 ng/mL (ref >5.9)

## 2022-02-22 LAB — VITAMIN B12: Vitamin B-12: 438 pg/mL (ref 180–914)

## 2022-02-27 ENCOUNTER — Other Ambulatory Visit (HOSPITAL_COMMUNITY): Payer: Self-pay | Admitting: *Deleted

## 2022-02-27 ENCOUNTER — Inpatient Hospital Stay (HOSPITAL_BASED_OUTPATIENT_CLINIC_OR_DEPARTMENT_OTHER): Payer: Medicare Other | Admitting: Hematology

## 2022-02-27 ENCOUNTER — Other Ambulatory Visit: Payer: Self-pay

## 2022-02-27 ENCOUNTER — Encounter: Payer: Self-pay | Admitting: Internal Medicine

## 2022-02-27 DIAGNOSIS — D72819 Decreased white blood cell count, unspecified: Secondary | ICD-10-CM | POA: Diagnosis not present

## 2022-02-27 DIAGNOSIS — D696 Thrombocytopenia, unspecified: Secondary | ICD-10-CM

## 2022-02-27 DIAGNOSIS — D500 Iron deficiency anemia secondary to blood loss (chronic): Secondary | ICD-10-CM

## 2022-02-27 NOTE — Patient Instructions (Signed)
[facility] at [facility] ?Discharge Instructions ? ? ?You were seen and examined today by Dr. Ellin Saba. ? ?He reviewed the results of your lab work which is normal/stable. ? ?Return as scheduled in 1 year for lab work and office visit.  ? ? ?Thank you for choosing [facility] at [facility] to provide your oncology and hematology care.  To afford each patient quality time with our provider, please arrive at least 15 minutes before your scheduled appointment time.  ? ?If you have a lab appointment with the Cancer Center please come in thru the Main Entrance and check in at the main information desk. ? ?You need to re-schedule your appointment should you arrive 10 or more minutes late.  We strive to give you quality time with our providers, and arriving late affects you and other patients whose appointments are after yours.  Also, if you no show three or more times for appointments you may be dismissed from the clinic at the providers discretion.     ?Again, thank you for choosing [facility].  Our hope is that these requests will decrease the amount of time that you wait before being seen by our physicians.       ?_____________________________________________________________ ? ?Should you have questions after your visit to [facility], please contact our office at [phone] and follow the prompts.  Our office hours are 8:00 a.m. and 4:30 p.m. Monday - Friday.  Please note that voicemails left after 4:00 p.m. may not be returned until the following business day.  We are closed weekends and major holidays.  You do have access to a nurse 24-7, just call the main number to the clinic [phone] and do not press any options, hold on the line and a nurse will answer the phone.   ? ?For prescription refill requests, have your pharmacy contact our office and allow 72 hours.   ? ?Due to Covid, you will need to wear a mask upon entering the  hospital. If you do not have a mask, a mask will be given to you at the Main Entrance upon arrival. For doctor visits, patients may have 1 support person age 18 or older with them. For treatment visits, patients can not have anyone with them due to social distancing guidelines and our immunocompromised population.  ? ?   ?

## 2022-02-27 NOTE — Progress Notes (Signed)
? ?[facility] ?[address] ?[place], [place] [address] ? ? ?CLINIC:  ?Medical Oncology/Hematology ? ?PCP:  ?[facility] ?[address] [place] [address]  ?[phone] ? ?REASON FOR VISIT:  ?Follow-up for leukopenia and thrombocytopenia ? ?PRIOR THERAPY: none ? ?CURRENT THERAPY: surveillance ? ?INTERVAL HISTORY:  ?Mr. Houston, a 66 y.o. male, returns for routine follow-up for his leukopenia and thrombocytopenia. Quantay was last seen on 02/14/2021. ? ?Today he reports feeling well. His appetite is good. He denies recent infections and fevers. He reports occasional night sweats.  ? ?REVIEW OF SYSTEMS:  ?Review of Systems  ?Constitutional:  Negative for appetite change and fever.  ?Respiratory:  Positive for shortness of breath.   ?Gastrointestinal:  Positive for abdominal pain.  ?Endocrine: Positive for hot flashes (occ night sweats).  ?Musculoskeletal:  Positive for flank pain.  ?All other systems reviewed and are negative. ? ?PAST MEDICAL/SURGICAL HISTORY:  ?Past Medical History:  ?Diagnosis Date  ? Acute cholecystitis 01/25/2016  ? Anxiety   ? BPH (benign prostatic hyperplasia)   ? Chronic vomiting   ? GERD (gastroesophageal reflux disease)   ? Hypercholesteremia   ? Hypertension   ? Mental retardation   ? OSA (obstructive sleep apnea)   ? no cpap machine; could not tolerate.  ? Schatzki's ring   ? Schizophrenia ([place])   ? ?Past Surgical History:  ?Procedure Laterality Date  ? BIOPSY  05/14/2017  ? Procedure: BIOPSY;  Surgeon: Daneil Dolin, MD;  [facility];  Service: Endoscopy;;  gastric polyp bx  ? CHOLECYSTECTOMY N/A 01/26/2016  ? Procedure: LAPAROSCOPIC CHOLECYSTECTOMY;  Surgeon: Aviva Signs, MD;  Location: [facility];  Service: General;  Laterality: N/A;  ? COLONOSCOPY N/A 09/21/2014  ? Dr. Gala Romney: right-sided diverticulosis, internal hemorrhoids. Repeat in 10 years.   ? COLONOSCOPY WITH PROPOFOL N/A 05/14/2017  ? Dr. Gala Romney: diverticulosis in ascending colon,  otherwise normal  ? ESOPHAGOGASTRODUODENOSCOPY N/A 09/21/2014  ? Dr. Gala Romney: Subtle non-critical Schatzki's ring s/p 74 F dilation, query occult cervical esophageal web. Hiatal hernia. Question gastroparesis.   ? ESOPHAGOGASTRODUODENOSCOPY (EGD) WITH PROPOFOL N/A 05/14/2017  ? Dr. Gala Romney: normal esophagus s/p empiric dilation, benign gastric polyps, normal duodenum  ? MALONEY DILATION N/A 09/21/2014  ? Procedure: MALONEY DILATION;  Surgeon: Daneil Dolin, MD;  [facility];  Service: Endoscopy;  Laterality: N/A;  ? MALONEY DILATION N/A 05/14/2017  ? Procedure: MALONEY DILATION;  Surgeon: Daneil Dolin, MD;  [facility];  Service: Endoscopy;  Laterality: N/A;  ? None    ? SAVORY DILATION N/A 09/21/2014  ? Procedure: SAVORY DILATION;  Surgeon: Daneil Dolin, MD;  [facility];  Service: Endoscopy;  Laterality: N/A;  ? TEE WITHOUT CARDIOVERSION N/A 02/02/2018  ? Procedure: TRANSESOPHAGEAL ECHOCARDIOGRAM (TEE) WITH PROPOFOL;  Surgeon: Fay Records, MD;  [facility];  Service: Cardiovascular;  Laterality: N/A;  ? ? ?SOCIAL HISTORY:  ?Social History  ? ?Socioeconomic History  ? Marital status: Single  ?  Spouse name: Not on file  ? Number of children: Not on file  ? Years of education: Not on file  ? Highest education level: Not on file  ?Occupational History  ? Not on file  ?Tobacco Use  ? Smoking status: Every Day  ?  Packs/day: 0.25  ?  Years: 30.00  ?  Pack years: 7.50  ?  Types: Cigarettes  ? Smokeless tobacco: Never  ? Tobacco comments:  ?  4 cigarettes a day  ?Vaping Use  ? Vaping Use: Never used  ?Substance and Sexual Activity  ? Alcohol use: No  ? Drug use: No  ? Sexual activity: Never  ?  Birth control/protection: None  ?Other Topics Concern  ? Not on file  ?Social History Narrative  ? Lives in [place].   ? Smokes cigarettes qd.  ? Able to smoke cigarettes at 8, 12, 4, 8pm.   ? Seen by Dr. Tamera Punt in [place].   ? [place].  ?   ? Legal guardian: [employer].   ? ?Social Determinants of Health  ? ?Financial Resource Strain: Not on file  ?Food Insecurity: Not on file  ?Transportation Needs: Not on file  ?Physical Activity: Not on file  ?Stress: Not on file  ?Social Connections: Not on file  ?Intimate Partner Violence: Not on file  ? ? ?FAMILY HISTORY:  ?Family History  ?Problem Relation Age of Onset  ? Colon cancer Other   ?     unknown  ? Hypertension Other   ? Hyperlipidemia Other   ? ? ?CURRENT MEDICATIONS:  ?Current Outpatient Medications  ?Medication Sig Dispense Refill  ? amLODipine (NORVASC) 5 MG tablet TAKE (1) TABLET BY MOUTH ONCE DAILY. 30 tablet 6  ? ARIPiprazole (ABILIFY) 10 MG tablet Take 5 mg by mouth in the morning and at bedtime.     ? ARIPiprazole (ABILIFY) 5 MG tablet Take 2.5 mg by mouth daily with supper.    ? buPROPion (WELLBUTRIN SR) 150 MG 12 hr tablet Take 150 mg by mouth 2 (two) times daily.    ? chlorhexidine (PERIDEX) 0.12 % solution Use as directed 15 mLs in the mouth or throat 2 (two) times daily.    ? DEXILANT 60 MG capsule TAKE 1 CAPSULE BY MOUTH ONCE A DAY. 30 capsule 11  ? dicyclomine (BENTYL) 20 MG tablet Take 1 tablet (20 mg total) by mouth every 6 (six) hours as needed for spasms. 30 tablet 1  ? docusate sodium (COLACE) 100 MG capsule TAKE (1) CAPSULE BY MOUTH TWICE DAILY FOR CONSTIPATION. 60 capsule 3  ? ENSURE (ENSURE) USE 1 CAN BY MOUTH TWICE DAILY 2370 mL PRN  ? famotidine (PEPCID) 40 MG tablet Take 40 mg by mouth at bedtime.    ? fluticasone (FLONASE) 50 MCG/ACT nasal spray Place 2 sprays into both nostrils daily.    ? mirtazapine (REMERON) 15 MG tablet TAKE 1 TABLET BY MOUTH DAILY 1 HOUR BEFORE BEDTIME. 30 tablet 11  ? oxybutynin (DITROPAN) 5 MG tablet TAKE 1 TABLET BY MOUTH EVERY MORNING. 30 tablet 11  ? polyethylene glycol (MIRALAX / GLYCOLAX) 17 g packet Take 17 g by mouth daily. Mix 17 grams in 8 ounces of water and drink once.    ? potassium chloride (MICRO-K) 10 MEQ CR capsule  Take 10 mEq by mouth 2 (two) times daily.    ? risperiDONE (RISPERDAL) 1 MG tablet Take 1 mg by mouth daily.    ? tamsulosin (FLOMAX) 0.4 MG CAPS capsule TAKE 1 CAPSULE BY MOUTH ONCE DAILY 30 MINUTES AFTER THE SAME MEAL EACH DAY. 30 capsule 11  ? triamterene-hydrochlorothiazide (MAXZIDE-25) 37.5-25 MG tablet TAKE (1) TABLET BY MOUTH DAILY IN THE MORNING FOR HIGH BLOOD PRESSURE. 30 tablet 11  ? Vitamin D, Ergocalciferol, (DRISDOL) 50000 units CAPS capsule TAKE 1 CAPSULE BY MOUTH ONCE A MONTH ON THE 1ST. DO NOT CRUSH. 1 capsule 11  ? ?No current facility-administered medications  for this visit.  ? ? ?ALLERGIES:  ?No Known Allergies ? ?PHYSICAL EXAM:  ?Performance status (ECOG): 1 - Symptomatic but completely ambulatory ? ?There were no vitals filed for this visit. ?Wt Readings from Last 3 Encounters:  ?01/18/22 140 lb 9.6 oz (63.8 kg)  ?01/09/22 136 lb 4 oz (61.8 kg)  ?05/30/21 143 lb (64.9 kg)  ? ?Physical Exam ?Vitals reviewed.  ?Constitutional:   ?   Appearance: Normal appearance.  ?Cardiovascular:  ?   Rate and Rhythm: Normal rate and regular rhythm.  ?   Pulses: Normal pulses.  ?   Heart sounds: Normal heart sounds.  ?Pulmonary:  ?   Effort: Pulmonary effort is normal.  ?   Breath sounds: Normal breath sounds.  ?Lymphadenopathy:  ?   Cervical: No cervical adenopathy.  ?   Right cervical: No superficial cervical adenopathy. ?   Left cervical: No superficial cervical adenopathy.  ?   Upper Body:  ?   Right upper body: No supraclavicular adenopathy.  ?   Left upper body: No supraclavicular adenopathy.  ?Neurological:  ?   General: No focal deficit present.  ?   Mental Status: He is alert and oriented to person, place, and time.  ?Psychiatric:     ?   Mood and Affect: Mood normal.     ?   Behavior: Behavior normal.  ? ? ?LABORATORY DATA:  ?I have reviewed the labs as listed.  ?CBC Latest Ref Rng & Units 02/22/2022 01/09/2022 02/07/2021  ?WBC 4.0 - 10.5 K/uL 3.9(L) 4.4 3.7(L)  ?Hemoglobin 13.0 - 17.0 g/dL 14.5 14.3 15.1   ?Hematocrit 39.0 - 52.0 % 44.4 42.8 45.0  ?Platelets 150 - 400 K/uL 123(L) 114.0(L) 135(L)  ? ?CMP Latest Ref Rng & Units 01/09/2022 02/07/2021 05/14/2018  ?Glucose 70 - 99 mg/dL 75 82 120(H)  ?BUN 6 -

## 2022-03-20 ENCOUNTER — Ambulatory Visit: Payer: Medicare Other | Admitting: Gastroenterology

## 2022-04-10 ENCOUNTER — Ambulatory Visit (INDEPENDENT_AMBULATORY_CARE_PROVIDER_SITE_OTHER): Payer: Medicare Other | Admitting: Gastroenterology

## 2022-04-10 ENCOUNTER — Encounter: Payer: Self-pay | Admitting: Gastroenterology

## 2022-04-10 VITALS — BP 148/88 | HR 73 | Ht 66.0 in | Wt 142.2 lb

## 2022-04-10 DIAGNOSIS — K5900 Constipation, unspecified: Secondary | ICD-10-CM | POA: Diagnosis not present

## 2022-04-10 DIAGNOSIS — K219 Gastro-esophageal reflux disease without esophagitis: Secondary | ICD-10-CM

## 2022-04-10 NOTE — Patient Instructions (Signed)
If you are age 66 or older, your body mass index should be between 23-30. Your Body mass index is 22.96 kg/m?[name] If this is out of the aforementioned range listed, please consider follow up with your Primary Care Provider. ? ?If you are age 77 or younger, your body mass index should be between 19-25. Your Body mass index is 22.96 kg/m?[name] If this is out of the aformentioned range listed, please consider follow up with your Primary Care Provider.  ? ?The [facility] providers would like to encourage you to use [facility] to communicate with providers for non-urgent requests or questions.  Due to long hold times on the telephone, sending your provider a message by [facility] may be a faster and more efficient way to get a response.  Please allow 48 business hours for a response.  Please remember that this is for non-urgent requests.  ? ?It was a pleasure to see you today! ? ?Thank you for trusting me with your gastrointestinal care!   ? ?Scott E.Tomasa Rand, MD  ?

## 2022-04-10 NOTE — Progress Notes (Signed)
? ?HPI : Kyle Wade is a very pleasant 66 year old male with a history of intellectual impairment, schizophrenia and anxiety.  I originally saw the patient in clinic January 25 for ongoing care of chronic constipation, and abdominal pain.  There is also concern about unintentional weight loss.  He has a history of GERD and Schatzki ring which has been dilated in the past. ?At that last visit, he was recommended to resume daily Metamucil to improve his bowel habits.  He was also prescribed Bentyl as needed for abdominal pain.  His weight has been noted to be overall stable, and he had already had an upper and lower endoscopy a few years ago to evaluate unintentional weight loss which were unremarkable.  Therefore, I did not recommend repeat endoscopy for this reason. ?Today, his weight remains stable at 142 pounds.  The patient is a poor historian, but is accompanied by his caretaker in clinic today who helps provide supporting information. ?His constipation seems to be well controlled with daily Metamucil and Colace.  He reports having bowel movement most days, with soft stools and no straining.  His caretaker verifies this.  He also denies have any problems with significant abdominal pain.  His caretaker also notes he has not been complaining about abdominal pain recently.  No recent blood in the stool.  He denies problems with heartburn/acid regurgitation or difficulty swallowing. ?He is taking Metamucil daily as well as Colace.  The patient takes his Metamucil but does not like the taste/consistency. ? ? ? ?Past Medical History:  ?Diagnosis Date  ? Acute cholecystitis 01/25/2016  ? Anxiety   ? BPH (benign prostatic hyperplasia)   ? Chronic vomiting   ? GERD (gastroesophageal reflux disease)   ? Hypercholesteremia   ? Hypertension   ? Mental retardation   ? OSA (obstructive sleep apnea)   ? no cpap machine; could not tolerate.  ? Schatzki's ring   ? Schizophrenia (HCC)   ? ? ? ?Past Surgical History:  ?Procedure  Laterality Date  ? BIOPSY  05/14/2017  ? Procedure: BIOPSY;  Surgeon: [name], MD;  [facility];  Service: Endoscopy;;  gastric polyp bx  ? CHOLECYSTECTOMY N/A 01/26/2016  ? Procedure: LAPAROSCOPIC CHOLECYSTECTOMY;  Surgeon: [name], MD;  Location: [facility];  Service: General;  Laterality: N/A;  ? COLONOSCOPY N/A 09/21/2014  ? [name]: right-sided diverticulosis, internal hemorrhoids. Repeat in 10 years.   ? COLONOSCOPY WITH PROPOFOL N/A 05/14/2017  ? [name]: diverticulosis in ascending colon, otherwise normal  ? ESOPHAGOGASTRODUODENOSCOPY N/A 09/21/2014  ? [name]: Subtle non-critical Schatzki's ring s/p 38 F dilation, query occult cervical esophageal web. Hiatal hernia. Question gastroparesis.   ? ESOPHAGOGASTRODUODENOSCOPY (EGD) WITH PROPOFOL N/A 05/14/2017  ? [name]: normal esophagus s/p empiric dilation, benign gastric polyps, normal duodenum  ? MALONEY DILATION N/A 09/21/2014  ? Procedure: MALONEY DILATION;  Surgeon: [name], MD;  [facility];  Service: Endoscopy;  Laterality: N/A;  ? MALONEY DILATION N/A 05/14/2017  ? Procedure: MALONEY DILATION;  Surgeon: [name], MD;  [facility];  Service: Endoscopy;  Laterality: N/A;  ? None    ? SAVORY DILATION N/A 09/21/2014  ? Procedure: SAVORY DILATION;  Surgeon: [name], MD;  [facility];  Service: Endoscopy;  Laterality: N/A;  ? TEE WITHOUT CARDIOVERSION N/A 02/02/2018  ? Procedure: TRANSESOPHAGEAL ECHOCARDIOGRAM (TEE) WITH PROPOFOL;  Surgeon: [name], MD;  [facility];  Service: Cardiovascular;  Laterality: N/A;  ? ?Family History  ?Problem Relation Age of Onset  ? Colon cancer Other   ?     unknown  ? Hypertension Other   ? Hyperlipidemia Other   ? ?Social History  ? ?Tobacco Use  ? Smoking status: Every Day  ?  Packs/day: 0.25  ?  Years: 30.00  ?  Pack years: 7.50  ?  Types: Cigarettes  ? Smokeless tobacco: Never  ? Tobacco comments:  ?  4 cigarettes a day  ?Vaping  Use  ? Vaping Use: Never used  ?Substance Use Topics  ? Alcohol use: No  ? Drug use: No  ? ?Current Outpatient Medications  ?Medication Sig Dispense Refill  ? amLODipine (NORVASC) 5 MG tablet TAKE (1) TABLET BY MOUTH ONCE DAILY. 30 tablet 6  ? buPROPion (WELLBUTRIN SR) 150 MG 12 hr tablet Take 150 mg by mouth 2 (two) times daily.    ? chlorhexidine (PERIDEX) 0.12 % solution Use as directed 15 mLs in the mouth or throat 2 (two) times daily.    ? DEXILANT 60 MG capsule TAKE 1 CAPSULE BY MOUTH ONCE A DAY. 30 capsule 11  ? docusate sodium (COLACE) 100 MG capsule TAKE (1) CAPSULE BY MOUTH TWICE DAILY FOR CONSTIPATION. 60 capsule 3  ? ENSURE (ENSURE) USE 1 CAN BY MOUTH TWICE DAILY 2370 mL PRN  ? famotidine (PEPCID) 40 MG tablet Take 40 mg by mouth at bedtime.    ? fluticasone (FLONASE) 50 MCG/ACT nasal spray Place 2 sprays into both nostrils daily.    ? mirtazapine (REMERON) 15 MG tablet TAKE 1 TABLET BY MOUTH DAILY 1 HOUR BEFORE BEDTIME. 30 tablet 11  ? oxybutynin (DITROPAN) 5 MG tablet TAKE 1 TABLET BY MOUTH EVERY MORNING. 30 tablet 11  ? potassium chloride (MICRO-K) 10 MEQ CR capsule Take 10 mEq by mouth 2 (two) times daily.    ? risperiDONE (RISPERDAL) 1 MG tablet Take 1 mg by mouth daily.    ? tamsulosin (FLOMAX) 0.4 MG CAPS capsule TAKE 1 CAPSULE BY MOUTH ONCE DAILY 30 MINUTES AFTER THE SAME MEAL EACH DAY. 30 capsule 11  ? triamterene-hydrochlorothiazide (MAXZIDE-25) 37.5-25 MG tablet TAKE (1) TABLET BY MOUTH DAILY IN THE MORNING FOR HIGH BLOOD PRESSURE. 30 tablet 11  ? Vitamin D, Ergocalciferol, (DRISDOL) 50000 units CAPS capsule TAKE 1 CAPSULE BY MOUTH ONCE A MONTH ON THE 1ST. DO NOT CRUSH. 1 capsule 11  ? ARIPiprazole (ABILIFY) 10 MG tablet Take 5 mg by mouth in the morning and at bedtime.  (Patient not taking: Reported on 04/10/2022)    ? ARIPiprazole (ABILIFY) 5 MG tablet Take 2.5 mg by mouth daily with supper. (Patient not taking: Reported on 04/10/2022)    ? polyethylene glycol (MIRALAX / GLYCOLAX) 17 g packet  Take 17 g by mouth daily. Mix 17 grams in 8 ounces of water and drink once. (Patient not taking: Reported on 04/10/2022)    ? ?No current facility-administered medications for this visit.  ? ?No Known Allergies ? ? ?Review of Systems: ?All systems reviewed and negative except where noted in HPI.  ? ? ?No results found. ? ?Physical Exam: ?BP (!) 148/88   Pulse 73   Ht 5\' 6"  (1.676 m)   Wt 142 lb 4 oz (64.5 kg)   SpO2 99%   BMI 22.96 kg/m?  ?Constitutional: Pleasant,well-developed, African-American male in no acute distress.  Patient able to answer most questions appropriately, but has difficult time providing  specifics regarding his symptoms.  Accompanied by caretaker ?  HEENT: Normocephalic and atraumatic. Conjunctivae are normal. No scleral icterus. ?Neck supple.  ?Cardiovascular: Normal rate, regular rhythm.  4/6 systolic murmur best heard at apex ?Pulmonary/chest: Effort normal and breath sounds normal. No wheezing, rales or rhonchi. ?Abdominal: Soft, nondistended, nontender. Bowel sounds active throughout. There are no masses palpable. No hepatomegaly. ?Extremities: no edema ?Skin: Skin is warm and dry. No rashes noted. ?Psychiatric: Normal mood and affect. Behavior is normal. ? ?CBC ?   ?Component Value Date/Time  ? WBC 3.9 (L) 02/22/2022 0926  ? RBC 4.83 02/22/2022 0926  ? HGB 14.5 02/22/2022 0926  ? HCT 44.4 02/22/2022 0926  ? HCT 44.3 10/30/2016 1143  ? PLT 123 (L) 02/22/2022 0926  ? MCV 91.9 02/22/2022 0926  ? MCH 30.0 02/22/2022 0926  ? MCHC 32.7 02/22/2022 0926  ? RDW 12.1 02/22/2022 0926  ? LYMPHSABS 1.1 02/22/2022 0926  ? MONOABS 0.4 02/22/2022 0926  ? EOSABS 0.1 02/22/2022 0926  ? BASOSABS 0.0 02/22/2022 0926  ? ? ?CMP  ?   ?Component Value Date/Time  ? NA 137 01/09/2022 1505  ? K 3.6 01/09/2022 1505  ? CL 97 01/09/2022 1505  ? CO2 36 (H) 01/09/2022 1505  ? GLUCOSE 75 01/09/2022 1505  ? BUN 8 01/09/2022 1505  ? CREATININE 1.03 01/09/2022 1505  ? CREATININE 1.21 04/08/2018 1052  ? CALCIUM 9.9  01/09/2022 1505  ? PROT 7.4 01/09/2022 1505  ? ALBUMIN 4.4 01/09/2022 1505  ? AST 18 01/09/2022 1505  ? ALT 24 01/09/2022 1505  ? ALKPHOS 66 01/09/2022 1505  ? BILITOT 0.5 01/09/2022 1505  ? GFRNONAA >60 02/07/19

## 2022-06-17 ENCOUNTER — Telehealth: Payer: Self-pay | Admitting: Gastroenterology

## 2022-06-17 ENCOUNTER — Ambulatory Visit: Payer: Medicaid Other | Admitting: Gastroenterology

## 2022-06-17 NOTE — Telephone Encounter (Signed)
Please note, patient transferred his care to [facility] and has been seen there 12/2021 and 03/2022.   Please cancel today's appointment and make note so that we don't allow future appointments made here without approval by a provider.

## 2022-08-02 ENCOUNTER — Encounter: Payer: Self-pay | Admitting: Cardiology

## 2022-08-02 ENCOUNTER — Ambulatory Visit (INDEPENDENT_AMBULATORY_CARE_PROVIDER_SITE_OTHER): Payer: Medicare Other | Admitting: Cardiology

## 2022-08-02 VITALS — BP 140/75 | HR 70 | Ht 66.0 in | Wt 146.8 lb

## 2022-08-02 DIAGNOSIS — I340 Nonrheumatic mitral (valve) insufficiency: Secondary | ICD-10-CM

## 2022-08-02 DIAGNOSIS — I10 Essential (primary) hypertension: Secondary | ICD-10-CM

## 2022-08-02 NOTE — Progress Notes (Signed)
Clinical Summary Mr. Alyea is a 66 y.o.maleseen today for follow up of the following medical problems.    1. MItral regurgitation/heart murmur - newly diagnosed heart murmur detected by pcp - echo Jan 2019 LVEF 60-65%, no WMAs, normal diastolic function, moderate to severe MR. LVIDs 19. The MV VTI/AV VTI of 1.5 would actually suggest severe   MR    - 01/2018 TEE that indicated moderate MR.  - 01/2019 TTE: moderate MR   - no recent edema. No SOB/DOE. Does daily chores without troubles.    09/2020 echo: LVEF 60-65%, moderate MR, LVIDs 2.5    - some recent fatigue, some SOB at times. No LE edema.   2. HTN - compliant with meds   3. Cognitive deficit/Mental handicap - has legal guardian   4. Schizophrenia - followed by pcp       SH: [name] with [place] [facility] is his guardian   Past Medical History:  Diagnosis Date   Acute cholecystitis 01/25/2016   Anxiety    BPH (benign prostatic hyperplasia)    Chronic vomiting    GERD (gastroesophageal reflux disease)    Hypercholesteremia    Hypertension    Mental retardation    OSA (obstructive sleep apnea)    no cpap machine; could not tolerate.   Schatzki's ring    Schizophrenia (HCC)      No Known Allergies   Current Outpatient Medications  Medication Sig Dispense Refill   amLODipine (NORVASC) 5 MG tablet TAKE (1) TABLET BY MOUTH ONCE DAILY. 30 tablet 6   ARIPiprazole (ABILIFY) 10 MG tablet Take 5 mg by mouth in the morning and at bedtime.  (Patient not taking: Reported on 04/10/2022)     ARIPiprazole (ABILIFY) 5 MG tablet Take 2.5 mg by mouth daily with supper. (Patient not taking: Reported on 04/10/2022)     buPROPion (WELLBUTRIN SR) 150 MG 12 hr tablet Take 150 mg by mouth 2 (two) times daily.     chlorhexidine (PERIDEX) 0.12 % solution Use as directed 15 mLs in the mouth or throat 2 (two) times daily.     DEXILANT 60 MG capsule TAKE 1 CAPSULE BY MOUTH ONCE A DAY. 30 capsule 11   docusate sodium (COLACE)  100 MG capsule TAKE (1) CAPSULE BY MOUTH TWICE DAILY FOR CONSTIPATION. 60 capsule 3   ENSURE (ENSURE) USE 1 CAN BY MOUTH TWICE DAILY 2370 mL PRN   famotidine (PEPCID) 40 MG tablet Take 40 mg by mouth at bedtime.     fluticasone (FLONASE) 50 MCG/ACT nasal spray Place 2 sprays into both nostrils daily.     mirtazapine (REMERON) 15 MG tablet TAKE 1 TABLET BY MOUTH DAILY 1 HOUR BEFORE BEDTIME. 30 tablet 11   oxybutynin (DITROPAN) 5 MG tablet TAKE 1 TABLET BY MOUTH EVERY MORNING. 30 tablet 11   polyethylene glycol (MIRALAX / GLYCOLAX) 17 g packet Take 17 g by mouth daily. Mix 17 grams in 8 ounces of water and drink once. (Patient not taking: Reported on 04/10/2022)     potassium chloride (MICRO-K) 10 MEQ CR capsule Take 10 mEq by mouth 2 (two) times daily.     risperiDONE (RISPERDAL) 1 MG tablet Take 1 mg by mouth daily.     tamsulosin (FLOMAX) 0.4 MG CAPS capsule TAKE 1 CAPSULE BY MOUTH ONCE DAILY 30 MINUTES AFTER THE SAME MEAL EACH DAY. 30 capsule 11   triamterene-hydrochlorothiazide (MAXZIDE-25) 37.5-25 MG tablet TAKE (1) TABLET BY MOUTH DAILY IN THE MORNING FOR HIGH BLOOD PRESSURE.  30 tablet 11   Vitamin D, Ergocalciferol, (DRISDOL) 50000 units CAPS capsule TAKE 1 CAPSULE BY MOUTH ONCE A MONTH ON THE 1ST. DO NOT CRUSH. 1 capsule 11   No current facility-administered medications for this visit.     Past Surgical History:  Procedure Laterality Date   BIOPSY  05/14/2017   Procedure: BIOPSY;  Surgeon: [name], MD;  [facility];  Service: Endoscopy;;  gastric polyp bx   CHOLECYSTECTOMY N/A 01/26/2016   Procedure: LAPAROSCOPIC CHOLECYSTECTOMY;  Surgeon: [name], MD;  Location: [facility];  Service: General;  Laterality: N/A;   COLONOSCOPY N/A 09/21/2014   [name]: right-sided diverticulosis, internal hemorrhoids. Repeat in 10 years.    COLONOSCOPY WITH PROPOFOL N/A 05/14/2017   [name]: diverticulosis in ascending colon, otherwise normal   ESOPHAGOGASTRODUODENOSCOPY N/A  09/21/2014   [name]: Subtle non-critical Schatzki's ring s/p 56 F dilation, query occult cervical esophageal web. Hiatal hernia. Question gastroparesis.    ESOPHAGOGASTRODUODENOSCOPY (EGD) WITH PROPOFOL N/A 05/14/2017   [name]: normal esophagus s/p empiric dilation, benign gastric polyps, normal duodenum   MALONEY DILATION N/A 09/21/2014   Procedure: Elease Hashimoto DILATION;  Surgeon: [name], MD;  [facility];  Service: Endoscopy;  Laterality: N/A;   MALONEY DILATION N/A 05/14/2017   Procedure: Elease Hashimoto DILATION;  Surgeon: [name], MD;  [facility];  Service: Endoscopy;  Laterality: N/A;   None     SAVORY DILATION N/A 09/21/2014   Procedure: SAVORY DILATION;  Surgeon: [name], MD;  [facility];  Service: Endoscopy;  Laterality: N/A;   TEE WITHOUT CARDIOVERSION N/A 02/02/2018   Procedure: TRANSESOPHAGEAL ECHOCARDIOGRAM (TEE) WITH PROPOFOL;  Surgeon: [name], MD;  [facility];  Service: Cardiovascular;  Laterality: N/A;     No Known Allergies    Family History  Problem Relation Age of Onset   Colon cancer Other        unknown   Hypertension Other    Hyperlipidemia Other      Social History Mr. Sheetz reports that he has been smoking cigarettes. He has a 7.50 pack-year smoking history. He has never used smokeless tobacco. Mr. Herda reports no history of alcohol use.   Review of Systems CONSTITUTIONAL: No weight loss, fever, chills, weakness or fatigue.  HEENT: Eyes: No visual loss, blurred vision, double vision or yellow sclerae.No hearing loss, sneezing, congestion, runny nose or sore throat.  SKIN: No rash or itching.  CARDIOVASCULAR: per hpi RESPIRATORY: No shortness of breath, cough or sputum.  GASTROINTESTINAL: No anorexia, nausea, vomiting or diarrhea. No abdominal pain or blood.  GENITOURINARY: No burning on urination, no polyuria NEUROLOGICAL: No headache, dizziness, syncope, paralysis, ataxia, numbness  or tingling in the extremities. No change in bowel or bladder control.  MUSCULOSKELETAL: No muscle, back pain, joint pain or stiffness.  LYMPHATICS: No enlarged nodes. No history of splenectomy.  PSYCHIATRIC: No history of depression or anxiety.  ENDOCRINOLOGIC: No reports of sweating, cold or heat intolerance. No polyuria or polydipsia.  [name]   Physical Examination Today's Vitals   08/02/22 1106  BP: (!) 140/80  Pulse: 70  SpO2: 97%  Weight: 146 lb 12.8 oz (66.6 kg)  Height: 5\' 6"  (1.676 m)   Body mass index is 23.69 kg/m.  Gen: resting comfortably, no acute distress HEENT: no scleral icterus, pupils equal round and reactive, no palptable cervical adenopathy,  CV: RRR, 3/6 systolic murmur apex, no jvd Resp: Clear to auscultation bilaterally  GI: abdomen is soft, non-tender, non-distended, normal bowel sounds, no hepatosplenomegaly MSK: extremities are warm, no edema.  Skin: warm, no rash Neuro:  no focal deficits Psych: appropriate affect   Diagnostic Studies 01/2019 TTE   IMPRESSIONS      1. The left ventricle has normal systolic function with an ejection fraction of 60-65%. The cavity size was normal. There is mild concentric left ventricular hypertrophy. Left ventricular diastolic parameters were normal No evidence of left ventricular regional wall motion abnormalities.  2. The right ventricle has normal systolic function. The cavity was normal. There is no increase in right ventricular wall thickness.  3. The mitral valve is myxomatous. Mild thickening of the mitral valve leaflet. Mitral valve regurgitation is moderate by color flow Doppler. Severity may be underestimated due to jet eccentricity.  4. The tricuspid valve is normal in structure.  5. The aortic valve is tricuspid.  6. The pulmonic valve was grossly normal. Pulmonic valve regurgitation is mild by color flow Doppler.  7. The aortic root is normal in size and structure.  8. No evidence of left ventricular  regional wall motion abnormalities.     09/2020 echo IMPRESSIONS     1. Left ventricular ejection fraction, by estimation, is 60 to 65%. The  left ventricle has normal function. The left ventricle has no regional  wall motion abnormalities. There is moderate left ventricular hypertrophy.  Left ventricular diastolic  parameters were normal.   2. Right ventricular systolic function is normal. The right ventricular  size is normal. There is normal pulmonary artery systolic pressure.   3. Left atrial size was mildly dilated.   4. 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 The eccentric jet is difficult to  quantify. The MV/AV VTI ratio is 1.4 suggesting moderate MR. . The mitral  valve is abnormal. Moderate mitral valve  regurgitation. No evidence of mitral stenosis.   5. The aortic valve has an indeterminant number of cusps. There is mild  calcification of the aortic valve. There is mild thickening of the aortic  valve. Aortic valve regurgitation is not visualized. No aortic stenosis is  present.   6. The inferior vena cava is normal in size with greater than 50%  respiratory variability, suggesting right atrial pressure of 3 mmHg.       Assessment and Plan   1. Mitral regurgitation - moderate MR by last imaging - we will repeat TTE - symptoms difficult to assess due to mental handicap. nonspecific generalized fatigue, difficult to assess if significant SOB.    2. HTN -elevated today, they will call us Monday with home bp's as appears those are more at goal. If needed could increase norvasc.    F/u 6 months     Antoine Poche, M.D.

## 2022-08-02 NOTE — Patient Instructions (Signed)
Medication Instructions:  Your physician recommends that you continue on your current medications as directed. Please refer to the Current Medication list given to you today.   Labwork: None  Testing/Procedures: Your physician has requested that you have an echocardiogram. Echocardiography is a painless test that uses sound waves to create images of your heart. It provides your doctor with information about the size and shape of your heart and how well your heart's chambers and valves are working. This procedure takes approximately one hour. There are no restrictions for this procedure.   Follow-Up: Follow up with Dr. Wyline Mood in 6 months.   Any Other Special Instructions Will Be Listed Below (If Applicable).  CALL OUR OFFICE MONDAY WITH YOUR UPDATED BLOOD PRESSURES.    If you need a refill on your cardiac medications before your next appointment, please call your pharmacy.

## 2022-08-06 ENCOUNTER — Ambulatory Visit (INDEPENDENT_AMBULATORY_CARE_PROVIDER_SITE_OTHER): Payer: Medicare Other

## 2022-08-06 DIAGNOSIS — I340 Nonrheumatic mitral (valve) insufficiency: Secondary | ICD-10-CM

## 2022-08-06 LAB — ECHOCARDIOGRAM COMPLETE
AR max vel: 2.78 cm2
AV Peak grad: 5.9 mmHg
Ao pk vel: 1.21 m/s
Area-P 1/2: 2.87 cm2
Calc EF: 83.4 %
MV M vel: 5.13 m/s
MV Peak grad: 105.3 mmHg
MV Vena cont: 0.22 cm
Radius: 0.33 cm
S' Lateral: 1.13 cm
Single Plane A2C EF: 81.8 %
Single Plane A4C EF: 84.3 %

## 2022-08-26 ENCOUNTER — Other Ambulatory Visit (HOSPITAL_COMMUNITY)
Admission: RE | Admit: 2022-08-26 | Discharge: 2022-08-26 | Disposition: A | Discharge status: Home / Self Care | Payer: Medicare Other | Source: Ambulatory Visit | Attending: Psychiatry | Admitting: Psychiatry

## 2022-08-26 DIAGNOSIS — Z79899 Other long term (current) drug therapy: Secondary | ICD-10-CM | POA: Insufficient documentation

## 2022-08-26 DIAGNOSIS — D72819 Decreased white blood cell count, unspecified: Secondary | ICD-10-CM

## 2022-08-26 DIAGNOSIS — D500 Iron deficiency anemia secondary to blood loss (chronic): Secondary | ICD-10-CM

## 2022-08-26 LAB — CBC WITH DIFFERENTIAL/PLATELET
Abs Immature Granulocytes: 0.01 10*3/uL (ref 0.00–0.07)
Basophils Absolute: 0 10*3/uL (ref 0.0–0.1)
Basophils Relative: 0 %
Eosinophils Absolute: 0 10*3/uL (ref 0.0–0.5)
Eosinophils Relative: 1 %
HCT: 44.9 % (ref 39.0–52.0)
Hemoglobin: 15.4 g/dL (ref 13.0–17.0)
Immature Granulocytes: 0 %
Lymphocytes Relative: 35 %
Lymphs Abs: 1.3 10*3/uL (ref 0.7–4.0)
MCH: 31.1 pg (ref 26.0–34.0)
MCHC: 34.3 g/dL (ref 30.0–36.0)
MCV: 90.7 fL (ref 80.0–100.0)
Monocytes Absolute: 0.4 10*3/uL (ref 0.1–1.0)
Monocytes Relative: 11 %
Neutro Abs: 2 10*3/uL (ref 1.7–7.7)
Neutrophils Relative %: 53 %
Platelets: 127 10*3/uL — ABNORMAL LOW (ref 150–400)
RBC: 4.95 MIL/uL (ref 4.22–5.81)
RDW: 12.4 % (ref 11.5–15.5)
WBC: 3.7 10*3/uL — ABNORMAL LOW (ref 4.0–10.5)
nRBC: 0 % (ref 0.0–0.2)

## 2022-08-26 LAB — COMPREHENSIVE METABOLIC PANEL
ALT: 21 U/L (ref 0–44)
AST: 23 U/L (ref 15–41)
Albumin: 4.7 g/dL (ref 3.5–5.0)
Alkaline Phosphatase: 63 U/L (ref 38–126)
Anion gap: 9 (ref 5–15)
BUN: 12 mg/dL (ref 8–23)
CO2: 28 mmol/L (ref 22–32)
Calcium: 9.7 mg/dL (ref 8.9–10.3)
Chloride: 99 mmol/L (ref 98–111)
Creatinine, Ser: 1.13 mg/dL (ref 0.61–1.24)
GFR, Estimated: >60 mL/min (ref >60)
Glucose, Bld: 60 mg/dL — ABNORMAL LOW (ref 70–99)
Potassium: 3.7 mmol/L (ref 3.5–5.1)
Sodium: 136 mmol/L (ref 135–145)
Total Bilirubin: 0.9 mg/dL (ref 0.3–1.2)
Total Protein: 8.2 g/dL — ABNORMAL HIGH (ref 6.5–8.1)

## 2022-08-26 LAB — LIPID PANEL
Cholesterol: 186 mg/dL (ref 0–200)
HDL: 73 mg/dL (ref >40)
LDL Cholesterol: 97 mg/dL (ref 0–99)
Total CHOL/HDL Ratio: 2.5 RATIO
Triglycerides: 79 mg/dL (ref <150)
VLDL: 16 mg/dL (ref 0–40)

## 2022-08-26 LAB — HEMOGLOBIN A1C
Hgb A1c MFr Bld: 5.2 % (ref 4.8–5.6)
Mean Plasma Glucose: 102.54 mg/dL

## 2022-08-26 LAB — FOLATE: Folate: 12.4 ng/mL (ref >5.9)

## 2022-08-26 LAB — VITAMIN B12: Vitamin B-12: 530 pg/mL (ref 180–914)

## 2022-08-26 LAB — TSH: TSH: 0.679 u[IU]/mL (ref 0.350–4.500)

## 2022-08-27 LAB — T4: T4, Total: 7 ug/dL (ref 4.5–12.0)

## 2022-08-27 LAB — T3, FREE: T3, Free: 2.8 pg/mL (ref 2.0–4.4)

## 2022-10-29 ENCOUNTER — Other Ambulatory Visit (HOSPITAL_COMMUNITY): Payer: Self-pay | Admitting: Psychiatry

## 2022-10-29 DIAGNOSIS — G3184 Mild cognitive impairment, so stated: Secondary | ICD-10-CM

## 2022-10-29 DIAGNOSIS — R413 Other amnesia: Secondary | ICD-10-CM

## 2022-11-18 ENCOUNTER — Other Ambulatory Visit (HOSPITAL_COMMUNITY): Payer: Self-pay | Admitting: Psychiatry

## 2022-11-18 ENCOUNTER — Ambulatory Visit (HOSPITAL_COMMUNITY)
Admission: RE | Admit: 2022-11-18 | Discharge: 2022-11-18 | Disposition: A | Discharge status: Home / Self Care | Payer: Medicare Other | Source: Ambulatory Visit | Attending: Psychiatry | Admitting: Psychiatry

## 2022-11-18 DIAGNOSIS — R413 Other amnesia: Secondary | ICD-10-CM | POA: Diagnosis present

## 2022-11-18 DIAGNOSIS — G3184 Mild cognitive impairment, so stated: Secondary | ICD-10-CM | POA: Insufficient documentation

## 2022-11-18 MED ORDER — GADOBUTROL 1 MMOL/ML IV SOLN
7.0000 mL | Freq: Once | INTRAVENOUS | Status: AC | PRN
  Administered 2022-11-18: 7 mL via INTRAVENOUS

## 2023-02-18 ENCOUNTER — Encounter: Payer: Self-pay | Admitting: Cardiology

## 2023-02-18 ENCOUNTER — Ambulatory Visit: Payer: 59 | Attending: Cardiology | Admitting: Cardiology

## 2023-02-18 VITALS — BP 124/82 | HR 62 | Ht 67.0 in | Wt 141.2 lb

## 2023-02-18 DIAGNOSIS — I10 Essential (primary) hypertension: Secondary | ICD-10-CM | POA: Diagnosis not present

## 2023-02-18 DIAGNOSIS — I340 Nonrheumatic mitral (valve) insufficiency: Secondary | ICD-10-CM | POA: Diagnosis not present

## 2023-02-18 NOTE — Patient Instructions (Signed)
Medication Instructions:  Your physician recommends that you continue on your current medications as directed. Please refer to the Current Medication list given to you today.  *If you need a refill on your cardiac medications before your next appointment, please call your pharmacy*   Lab Work: None If you have labs (blood work) drawn today and your tests are completely normal, you will receive your results only by: [place] (if you have MyChart) OR A paper copy in the mail If you have any lab test that is abnormal or we need to change your treatment, we will call you to review the results.   Testing/Procedures: None   Follow-Up: At [facility], you and your health needs are our priority.  As part of our continuing mission to provide you with exceptional heart care, we have created designated Provider Care Teams.  These Care Teams include your primary Cardiologist (physician) and Advanced Practice Providers (APPs -  Physician Assistants and Nurse Practitioners) who all work together to provide you with the care you need, when you need it.  We recommend signing up for the patient portal called "MyChart".  Sign up information is provided on this After Visit Summary.  MyChart is used to connect with patients for Virtual Visits (Telemedicine).  Patients are able to view lab/test results, encounter notes, upcoming appointments, etc.  Non-urgent messages can be sent to your provider as well.   To learn more about what you can do with MyChart, go to [email or web address].    Your next appointment:   6 month(s)  Provider:   Carlyle Dolly, MD    Other Instructions

## 2023-02-18 NOTE — Progress Notes (Signed)
Clinical Summary Mr. Larick is a 67 y.o.male seen today for follow up of the following medical problems.    1. MItral regurgitation/heart murmur - newly diagnosed heart murmur detected by pcp - echo Jan 2019 LVEF 60-65%, no WMAs, normal diastolic function, moderate to severe MR. LVIDs 19. The MV VTI/AV VTI of 1.5 would actually suggest severe   MR    - 01/2018 TEE that indicated moderate MR.  - 01/2019 TTE: moderate MR   - no recent edema. No SOB/DOE. Does daily chores without troubles.    09/2020 echo: LVEF 60-65%, moderate MR, LVIDs 2.5    07/2022 echo: LVEF 70-75%, reported LVIDs 1.13, no WMAs, grade I dd, mod MR  - no symptoms   2. HTN - he is compliant with meds   3. Cognitive deficit/Mental handicap - has legal guardian   4. Schizophrenia - followed by pcp       SH: [name] with [place] [facility] is his guardian   Past Medical History:  Diagnosis Date   Acute cholecystitis 01/25/2016   Anxiety    BPH (benign prostatic hyperplasia)    Chronic vomiting    GERD (gastroesophageal reflux disease)    Hypercholesteremia    Hypertension    Mental retardation    OSA (obstructive sleep apnea)    no cpap machine; could not tolerate.   Schatzki's ring    Schizophrenia (HCC)      No Known Allergies   Current Outpatient Medications  Medication Sig Dispense Refill   amLODipine (NORVASC) 5 MG tablet TAKE (1) TABLET BY MOUTH ONCE DAILY. 30 tablet 6   ammonium lactate (AMLACTIN) 12 % cream Apply 1 Application topically 2 (two) times daily.     buPROPion (WELLBUTRIN SR) 150 MG 12 hr tablet Take 150 mg by mouth 2 (two) times daily.     DEXILANT 60 MG capsule TAKE 1 CAPSULE BY MOUTH ONCE A DAY. 30 capsule 11   docusate sodium (COLACE) 100 MG capsule TAKE (1) CAPSULE BY MOUTH TWICE DAILY FOR CONSTIPATION. 60 capsule 3   ENSURE (ENSURE) USE 1 CAN BY MOUTH TWICE DAILY 2370 mL PRN   famotidine (PEPCID) 40 MG tablet Take 40 mg by mouth at bedtime.     fluticasone  (FLONASE) 50 MCG/ACT nasal spray Place 2 sprays into both nostrils daily.     mirtazapine (REMERON) 15 MG tablet TAKE 1 TABLET BY MOUTH DAILY 1 HOUR BEFORE BEDTIME. 30 tablet 11   oxybutynin (DITROPAN) 5 MG tablet TAKE 1 TABLET BY MOUTH EVERY MORNING. 30 tablet 11   potassium chloride (MICRO-K) 10 MEQ CR capsule Take 10 mEq by mouth 2 (two) times daily.     psyllium (METAMUCIL) 58.6 % powder Take 1 packet by mouth daily.     risperiDONE (RISPERDAL) 0.5 MG tablet Take 0.5 mg by mouth at bedtime.     risperiDONE (RISPERDAL) 1 MG tablet Take 1 mg by mouth 3 (three) times daily.     tamsulosin (FLOMAX) 0.4 MG CAPS capsule TAKE 1 CAPSULE BY MOUTH ONCE DAILY 30 MINUTES AFTER THE SAME MEAL EACH DAY. 30 capsule 11   triamterene-hydrochlorothiazide (MAXZIDE-25) 37.5-25 MG tablet TAKE (1) TABLET BY MOUTH DAILY IN THE MORNING FOR HIGH BLOOD PRESSURE. 30 tablet 11   Vitamin D, Ergocalciferol, (DRISDOL) 50000 units CAPS capsule TAKE 1 CAPSULE BY MOUTH ONCE A MONTH ON THE 1ST. DO NOT CRUSH. 1 capsule 11   No current facility-administered medications for this visit.     Past Surgical  History:  Procedure Laterality Date   BIOPSY  05/14/2017   Procedure: BIOPSY;  Surgeon: Daneil Dolin, MD;  [facility];  Service: Endoscopy;;  gastric polyp bx   CHOLECYSTECTOMY N/A 01/26/2016   Procedure: LAPAROSCOPIC CHOLECYSTECTOMY;  Surgeon: Aviva Signs, MD;  Location: [facility];  Service: General;  Laterality: N/A;   COLONOSCOPY N/A 09/21/2014   Dr. Gala Romney: right-sided diverticulosis, internal hemorrhoids. Repeat in 10 years.    COLONOSCOPY WITH PROPOFOL N/A 05/14/2017   Dr. Gala Romney: diverticulosis in ascending colon, otherwise normal   ESOPHAGOGASTRODUODENOSCOPY N/A 09/21/2014   Dr. Gala Romney: Subtle non-critical Schatzki's ring s/p 56 F dilation, query occult cervical esophageal web. Hiatal hernia. Question gastroparesis.    ESOPHAGOGASTRODUODENOSCOPY (EGD) WITH PROPOFOL N/A 05/14/2017   Dr. Gala Romney: normal esophagus  s/p empiric dilation, benign gastric polyps, normal duodenum   MALONEY DILATION N/A 09/21/2014   Procedure: Venia Minks DILATION;  Surgeon: Daneil Dolin, MD;  [facility];  Service: Endoscopy;  Laterality: N/A;   MALONEY DILATION N/A 05/14/2017   Procedure: Venia Minks DILATION;  Surgeon: Daneil Dolin, MD;  [facility];  Service: Endoscopy;  Laterality: N/A;   None     SAVORY DILATION N/A 09/21/2014   Procedure: SAVORY DILATION;  Surgeon: Daneil Dolin, MD;  [facility];  Service: Endoscopy;  Laterality: N/A;   TEE WITHOUT CARDIOVERSION N/A 02/02/2018   Procedure: TRANSESOPHAGEAL ECHOCARDIOGRAM (TEE) WITH PROPOFOL;  Surgeon: Fay Records, MD;  [facility];  Service: Cardiovascular;  Laterality: N/A;     No Known Allergies    Family History  Problem Relation Age of Onset   Colon cancer Other        unknown   Hypertension Other    Hyperlipidemia Other      Social History Mr. Manalac reports that he has been smoking cigarettes. He has a 7.50 pack-year smoking history. He has never used smokeless tobacco. Mr. Wheeland reports no history of alcohol use.   Review of Systems CONSTITUTIONAL: No weight loss, fever, chills, weakness or fatigue.  HEENT: Eyes: No visual loss, blurred vision, double vision or yellow sclerae.No hearing loss, sneezing, congestion, runny nose or sore throat.  SKIN: No rash or itching.  CARDIOVASCULAR: per hpi RESPIRATORY: No shortness of breath, cough or sputum.  GASTROINTESTINAL: No anorexia, nausea, vomiting or diarrhea. No abdominal pain or blood.  GENITOURINARY: No burning on urination, no polyuria NEUROLOGICAL: No headache, dizziness, syncope, paralysis, ataxia, numbness or tingling in the extremities. No change in bowel or bladder control.  MUSCULOSKELETAL: No muscle, back pain, joint pain or stiffness.  LYMPHATICS: No enlarged nodes. No history of splenectomy.  PSYCHIATRIC: No history of depression or anxiety.   ENDOCRINOLOGIC: No reports of sweating, cold or heat intolerance. No polyuria or polydipsia.  [name]   Physical Examination Today's Vitals   02/18/23 0943  BP: 124/82  Pulse: 62  SpO2: 98%  Weight: 141 lb 3.2 oz (64 kg)  Height: '5\' 7"'$  (1.702 m)   Body mass index is 22.12 kg/m.  Gen: resting comfortably, no acute distress HEENT: no scleral icterus, pupils equal round and reactive, no palptable cervical adenopathy,  CV: RRR, 3/6 systolic murmur apex, no jvd Resp: Clear to auscultation bilaterally GI: abdomen is soft, non-tender, non-distended, normal bowel sounds, no hepatosplenomegaly MSK: extremities are warm, no edema.  Skin: warm, no rash Neuro:  no focal deficits Psych: appropriate affect   Diagnostic Studies  01/2019 TTE   IMPRESSIONS      1.  The left ventricle has normal systolic function with an ejection fraction of 60-65%. The cavity size was normal. There is mild concentric left ventricular hypertrophy. Left ventricular diastolic parameters were normal No evidence of left ventricular regional wall motion abnormalities.  2. The right ventricle has normal systolic function. The cavity was normal. There is no increase in right ventricular wall thickness.  3. The mitral valve is myxomatous. Mild thickening of the mitral valve leaflet. Mitral valve regurgitation is moderate by color flow Doppler. Severity may be underestimated due to jet eccentricity.  4. The tricuspid valve is normal in structure.  5. The aortic valve is tricuspid.  6. The pulmonic valve was grossly normal. Pulmonic valve regurgitation is mild by color flow Doppler.  7. The aortic root is normal in size and structure.  8. No evidence of left ventricular regional wall motion abnormalities.     09/2020 echo IMPRESSIONS     1. Left ventricular ejection fraction, by estimation, is 60 to 65%. The  left ventricle has normal function. The left ventricle has no regional  wall motion abnormalities. There is  moderate left ventricular hypertrophy.  Left ventricular diastolic  parameters were normal.   2. Right ventricular systolic function is normal. The right ventricular  size is normal. There is normal pulmonary artery systolic pressure.   3. Left atrial size was mildly dilated.   4. 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 The eccentric jet is difficult to  quantify. The MV/AV VTI ratio is 1.4 suggesting moderate MR. . The mitral  valve is abnormal. Moderate mitral valve  regurgitation. No evidence of mitral stenosis.   5. The aortic valve has an indeterminant number of cusps. There is mild  calcification of the aortic valve. There is mild thickening of the aortic  valve. Aortic valve regurgitation is not visualized. No aortic stenosis is  present.   6. The inferior vena cava is normal in size with greater than 50%  respiratory variability, suggesting right atrial pressure of 3 mmHg.       07/2022 echo IMPRESSIONS     1. Left ventricular ejection fraction, by estimation, is 70 to 75%. The  left ventricle has hyperdynamic function. The left ventricle has no  regional wall motion abnormalities. There is mild left ventricular  hypertrophy. Left ventricular diastolic  parameters are consistent with Grade I diastolic dysfunction (impaired  relaxation). The average left ventricular global longitudinal strain is  -24.0 %. The global longitudinal strain is normal.   2. Right ventricular systolic function is normal. The right ventricular  size is normal. There is normal pulmonary artery systolic pressure. The  estimated right ventricular systolic pressure is 123XX123 mmHg.   3. Left atrial size was mildly dilated.   4. The mitral valve is myxomatous. There is moderate prolapse of the  posterior leaflet and partially anterior leaflet. At least moderate mitral  valve regurgitation, eccentric.   5. The aortic valve is tricuspid. Aortic valve  regurgitation is not  visualized.   6. The inferior vena cava is normal in size with greater than 50%  respiratory variability, suggesting right atrial pressure of 3 mmHg.    Assessment and Plan  1. Mitral regurgitation - remains moderate by recent echo, no symptoms - continue to monitor, repeat echo 2025   2. HTN -at goal, continue current meds   F/u 6 months       Arnoldo Lenis, M.D.

## 2023-02-18 NOTE — Addendum Note (Signed)
Addended by: Barbarann Ehlers A on: 02/18/2023 10:33 AM   Modules accepted: Orders

## 2023-02-24 DIAGNOSIS — L110 Acquired keratosis follicularis: Secondary | ICD-10-CM | POA: Diagnosis not present

## 2023-02-24 DIAGNOSIS — B351 Tinea unguium: Secondary | ICD-10-CM | POA: Diagnosis not present

## 2023-02-24 DIAGNOSIS — I739 Peripheral vascular disease, unspecified: Secondary | ICD-10-CM | POA: Diagnosis not present

## 2023-02-25 ENCOUNTER — Other Ambulatory Visit: Payer: Self-pay

## 2023-02-25 DIAGNOSIS — I10 Essential (primary) hypertension: Secondary | ICD-10-CM | POA: Diagnosis not present

## 2023-02-25 DIAGNOSIS — Z79899 Other long term (current) drug therapy: Secondary | ICD-10-CM | POA: Diagnosis not present

## 2023-02-25 DIAGNOSIS — D72819 Decreased white blood cell count, unspecified: Secondary | ICD-10-CM

## 2023-02-25 DIAGNOSIS — K219 Gastro-esophageal reflux disease without esophagitis: Secondary | ICD-10-CM | POA: Diagnosis not present

## 2023-02-26 ENCOUNTER — Inpatient Hospital Stay: Payer: 59 | Attending: Hematology

## 2023-02-26 DIAGNOSIS — F17210 Nicotine dependence, cigarettes, uncomplicated: Secondary | ICD-10-CM | POA: Insufficient documentation

## 2023-02-26 DIAGNOSIS — D72819 Decreased white blood cell count, unspecified: Secondary | ICD-10-CM | POA: Insufficient documentation

## 2023-02-26 DIAGNOSIS — D696 Thrombocytopenia, unspecified: Secondary | ICD-10-CM | POA: Insufficient documentation

## 2023-02-26 DIAGNOSIS — Z800 Family history of malignant neoplasm of digestive organs: Secondary | ICD-10-CM | POA: Insufficient documentation

## 2023-03-05 ENCOUNTER — Ambulatory Visit: Payer: Medicaid Other | Admitting: Physician Assistant

## 2023-03-05 ENCOUNTER — Inpatient Hospital Stay: Payer: 59

## 2023-03-05 ENCOUNTER — Inpatient Hospital Stay: Payer: 59 | Admitting: Hematology

## 2023-03-05 ENCOUNTER — Other Ambulatory Visit (HOSPITAL_COMMUNITY)
Admission: RE | Admit: 2023-03-05 | Discharge: 2023-03-05 | Disposition: A | Discharge status: Home / Self Care | Payer: 59 | Source: Ambulatory Visit | Attending: Hematology | Admitting: Hematology

## 2023-03-05 DIAGNOSIS — D72819 Decreased white blood cell count, unspecified: Secondary | ICD-10-CM | POA: Diagnosis not present

## 2023-03-05 LAB — CBC WITH DIFFERENTIAL/PLATELET
Abs Immature Granulocytes: 0 10*3/uL (ref 0.00–0.07)
Basophils Absolute: 0 10*3/uL (ref 0.0–0.1)
Basophils Relative: 0 %
Eosinophils Absolute: 0 10*3/uL (ref 0.0–0.5)
Eosinophils Relative: 1 %
HCT: 42.1 % (ref 39.0–52.0)
Hemoglobin: 14.2 g/dL (ref 13.0–17.0)
Immature Granulocytes: 0 %
Lymphocytes Relative: 50 %
Lymphs Abs: 1.1 10*3/uL (ref 0.7–4.0)
MCH: 30.1 pg (ref 26.0–34.0)
MCHC: 33.7 g/dL (ref 30.0–36.0)
MCV: 89.4 fL (ref 80.0–100.0)
Monocytes Absolute: 0.3 10*3/uL (ref 0.1–1.0)
Monocytes Relative: 15 %
Neutro Abs: 0.8 10*3/uL — ABNORMAL LOW (ref 1.7–7.7)
Neutrophils Relative %: 34 %
Platelets: 114 10*3/uL — ABNORMAL LOW (ref 150–400)
RBC: 4.71 MIL/uL (ref 4.22–5.81)
RDW: 12.2 % (ref 11.5–15.5)
WBC Morphology: REACTIVE
WBC: 2.2 10*3/uL — ABNORMAL LOW (ref 4.0–10.5)
nRBC: 0 % (ref 0.0–0.2)

## 2023-03-11 NOTE — Progress Notes (Unsigned)
Kyle Wade, [place] [address]   CLINIC:  Medical Oncology/Hematology  PCP:  [facility] [address] [place] [address] [phone]   REASON FOR VISIT:  Follow-up for leukopenia and thrombocytopenia  PRIOR THERAPY: None  CURRENT THERAPY: Surveillance  INTERVAL HISTORY:   Kyle Wade 67 y.o. male returns for routine follow-up of his leukopenia and thrombocytopenia.  He was last seen by Dr. Delton Coombes on 02/27/2022.  He is accompanied today by a staff member of his group home.  At today's visit, he reports feeling well.  No recent hospitalizations, surgeries, or changes in baseline health status.  He denies any recent infections, lymphadenopathy, or B symptoms.  He reports that his gums bleed easily when he brushes his teeth.  He denies any abnormal bruising or petechial rash.  He continues to take his risperidone and mirtazapine.  He denies any new medications or dose changes in the last year.    He has 75% energy and 90% appetite. He endorses that he is maintaining a stable weight.   ASSESSMENT & PLAN:  1.  Mild leukopenia and thrombocytopenia: - Leukopenia since November 2016, with intermittent neutropenia.  Thrombocytopenia since February 2017. - CTA abdomen/pelvis (2017) showed no gross abnormality of liver or spleen - ANA negative.  (Rheumatoid factor and SPEP not checked).  No known history of connective tissue or autoimmune disease.   - Drug-induced etiology suspected, as both risperidone and mirtazapine are known to cause mild leukopenia and thrombocytopenia. - Does not report any fevers, night sweats or weight loss in the last 1 year.  No infections or antibiotic use. - No abnormal bruising or petechial rash.  He reports gum bleeding when he brushes his teeth, likely due to poor dentition. - [id number], folic acid, ferritin and iron panel are within normal limits (March 2023). - Most recent CBC/differential  (03/05/2023): Platelets 114, at baseline.  WBC somewhat decreased from his usual leukopenia at 2.2, with ANC 0.8. - Likely etiology of leukopenia and thrombocytopenia is drug-induced.   - PLAN: Due to acute worsening of leukopenia, we will recheck CBC/differential in 1 month.  If WBC/ANC remains significantly low, we will check additional labs at that time. - We will tentatively plan on seeing patient for follow-up in 1 year, but based on the above labs we may need to see him sooner.  (Follow-up visit in next year we will plan to check SPEP, nutritional panel, and rheumatoid factor)  2.  Other history - Patient resides at [facility].  PLAN SUMMARY: >> Labs only (CBC/differential) in 1 month - will call with results and any changes to the plan >> Labs in 1 year = CBC/D, LDH, B12, MMA, folate, copper, SPEP, immunofixation, light chains, rheumatoid factor >> OFFICE visit in 1 year (1 week after labs     REVIEW OF SYSTEMS:   Review of Systems  Constitutional:  Positive for fatigue. Negative for appetite change, chills, diaphoresis, fever and unexpected weight change.  HENT:   Negative for lump/mass and nosebleeds.   Eyes:  Negative for eye problems.  Respiratory:  Positive for shortness of breath. Negative for cough and hemoptysis.   Cardiovascular:  Negative for chest pain, leg swelling and palpitations.  Gastrointestinal:  Negative for abdominal pain, blood in stool, constipation, diarrhea, nausea and vomiting.  Genitourinary:  Positive for difficulty urinating. Negative for hematuria.   Skin: Negative.   Neurological:  Negative for dizziness, headaches and light-headedness.  Hematological:  Bruises/bleeds easily (Gums bleed when brushing teeth).     PHYSICAL EXAM:  ECOG PERFORMANCE STATUS: 0 - Asymptomatic  There were no vitals filed for this visit. There were no vitals filed for this visit. Physical Exam Constitutional:      Appearance: Normal appearance. He is normal weight.   Cardiovascular:     Heart sounds: Murmur heard.  Pulmonary:     Breath sounds: Normal breath sounds.  Neurological:     General: No focal deficit present.     Mental Status: Mental status is at baseline.  Psychiatric:        Speech: Speech is delayed.        Behavior: Behavior normal. Behavior is cooperative.     Comments: Patient is slow to answer questions, but responds appropriately.    PAST MEDICAL/SURGICAL HISTORY:  Past Medical History:  Diagnosis Date   Acute cholecystitis 01/25/2016   Anxiety    BPH (benign prostatic hyperplasia)    Chronic vomiting    GERD (gastroesophageal reflux disease)    Hypercholesteremia    Hypertension    Mental retardation    OSA (obstructive sleep apnea)    no cpap machine; could not tolerate.   Schatzki's ring    Schizophrenia [facility])    Past Surgical History:  Procedure Laterality Date   BIOPSY  05/14/2017   Procedure: BIOPSY;  Surgeon: Daneil Dolin, MD;  [facility];  Service: Endoscopy;;  gastric polyp bx   CHOLECYSTECTOMY N/A 01/26/2016   Procedure: LAPAROSCOPIC CHOLECYSTECTOMY;  Surgeon: Aviva Signs, MD;  Location: [facility];  Service: General;  Laterality: N/A;   COLONOSCOPY N/A 09/21/2014   Dr. Gala Romney: right-sided diverticulosis, internal hemorrhoids. Repeat in 10 years.    COLONOSCOPY WITH PROPOFOL N/A 05/14/2017   Dr. Gala Romney: diverticulosis in ascending colon, otherwise normal   ESOPHAGOGASTRODUODENOSCOPY N/A 09/21/2014   Dr. Gala Romney: Subtle non-critical Schatzki's ring s/p 56 F dilation, query occult cervical esophageal web. Hiatal hernia. Question gastroparesis.    ESOPHAGOGASTRODUODENOSCOPY (EGD) WITH PROPOFOL N/A 05/14/2017   Dr. Gala Romney: normal esophagus s/p empiric dilation, benign gastric polyps, normal duodenum   MALONEY DILATION N/A 09/21/2014   Procedure: Venia Minks DILATION;  Surgeon: Daneil Dolin, MD;  [facility];  Service: Endoscopy;  Laterality: N/A;   MALONEY DILATION N/A 05/14/2017   Procedure:  Venia Minks DILATION;  Surgeon: Daneil Dolin, MD;  [facility];  Service: Endoscopy;  Laterality: N/A;   None     SAVORY DILATION N/A 09/21/2014   Procedure: SAVORY DILATION;  Surgeon: Daneil Dolin, MD;  [facility];  Service: Endoscopy;  Laterality: N/A;   TEE WITHOUT CARDIOVERSION N/A 02/02/2018   Procedure: TRANSESOPHAGEAL ECHOCARDIOGRAM (TEE) WITH PROPOFOL;  Surgeon: Fay Records, MD;  [facility];  Service: Cardiovascular;  Laterality: N/A;    SOCIAL HISTORY:  Social History   Socioeconomic History   Marital status: Single    Spouse name: Not on file   Number of children: Not on file   Years of education: Not on file   Highest education level: Not on file  Occupational History   Not on file  Tobacco Use   Smoking status: Every Day    Packs/day: 0.25    Years: 30.00    Additional pack years: 0.00    Total pack years: 7.50    Types: Cigarettes   Smokeless tobacco: Never   Tobacco comments:    4 cigarettes a day  Vaping Use   Vaping Use:  Never used  Substance and Sexual Activity   Alcohol use: No   Drug use: No   Sexual activity: Never    Birth control/protection: None  Other Topics Concern   Not on file  Social History Narrative   Lives in [place].    Smokes cigarettes qd.   Able to smoke cigarettes at 8, 12, 4, 8pm.    Seen by Dr. Tamera Punt in [facility]    [place].      Legal guardian: [employer], [employer].    Social Determinants of Health   Financial Resource Strain: Not on file  Food Insecurity: Not on file  Transportation Needs: Not on file  Physical Activity: Not on file  Stress: Not on file  Social Connections: Not on file  Intimate Partner Violence: Not on file    FAMILY HISTORY:  Family History  Problem Relation Age of Onset   Colon cancer Other        unknown   Hypertension Other    Hyperlipidemia Other     CURRENT MEDICATIONS:  Outpatient Encounter  Medications as of 03/12/2023  Medication Sig   amLODipine (NORVASC) 5 MG tablet TAKE (1) TABLET BY MOUTH ONCE DAILY.   ammonium lactate (AMLACTIN) 12 % cream Apply 1 Application topically 2 (two) times daily.   buPROPion (WELLBUTRIN SR) 150 MG 12 hr tablet Take 150 mg by mouth 2 (two) times daily.   DEXILANT 60 MG capsule TAKE 1 CAPSULE BY MOUTH ONCE A DAY.   docusate sodium (COLACE) 100 MG capsule TAKE (1) CAPSULE BY MOUTH TWICE DAILY FOR CONSTIPATION.   ENSURE (ENSURE) USE 1 CAN BY MOUTH TWICE DAILY   famotidine (PEPCID) 40 MG tablet Take 40 mg by mouth at bedtime.   fluticasone (FLONASE) 50 MCG/ACT nasal spray Place 2 sprays into both nostrils daily.   mirtazapine (REMERON) 15 MG tablet TAKE 1 TABLET BY MOUTH DAILY 1 HOUR BEFORE BEDTIME.   oxybutynin (DITROPAN) 5 MG tablet TAKE 1 TABLET BY MOUTH EVERY MORNING.   potassium chloride (MICRO-K) 10 MEQ CR capsule Take 10 mEq by mouth 2 (two) times daily.   psyllium (METAMUCIL) 58.6 % powder Take 1 packet by mouth daily.   risperiDONE (RISPERDAL) 0.5 MG tablet Take 0.5 mg by mouth at bedtime.   risperiDONE (RISPERDAL) 1 MG tablet Take 1 mg by mouth 3 (three) times daily.   tamsulosin (FLOMAX) 0.4 MG CAPS capsule TAKE 1 CAPSULE BY MOUTH ONCE DAILY 30 MINUTES AFTER THE SAME MEAL EACH DAY.   triamterene-hydrochlorothiazide (MAXZIDE-25) 37.5-25 MG tablet TAKE (1) TABLET BY MOUTH DAILY IN THE MORNING FOR HIGH BLOOD PRESSURE.   Vitamin D, Ergocalciferol, (DRISDOL) 50000 units CAPS capsule TAKE 1 CAPSULE BY MOUTH ONCE A MONTH ON THE 1ST. DO NOT CRUSH.   No facility-administered encounter medications on file as of 03/12/2023.    ALLERGIES:  No Known Allergies  LABORATORY DATA:  I have reviewed the labs as listed.  CBC    Component Value Date/Time   WBC 2.2 (L) 03/05/2023 1002   RBC 4.71 03/05/2023 1002   HGB 14.2 03/05/2023 1002   HCT 42.1 03/05/2023 1002   HCT 44.3 10/30/2016 1143   PLT 114 (L) 03/05/2023 1002   MCV 89.4 03/05/2023 1002    MCH 30.1 03/05/2023 1002   MCHC 33.7 03/05/2023 1002   RDW 12.2 03/05/2023 1002   LYMPHSABS 1.1 03/05/2023 1002   MONOABS 0.3 03/05/2023 1002   EOSABS 0.0 03/05/2023 1002   BASOSABS 0.0 03/05/2023  1002      Latest Ref Rng & Units 08/26/2022   10:41 AM 01/09/2022    3:05 PM 02/07/2021    8:43 AM  CMP  Glucose 70 - 99 mg/dL 60  75  82   BUN 8 - 23 mg/dL 12  8  9    Creatinine 0.61 - 1.24 mg/dL 1.13  1.03  1.08   Sodium 135 - 145 mmol/L 136  137  135   Potassium 3.5 - 5.1 mmol/L 3.7  3.6  3.6   Chloride 98 - 111 mmol/L 99  97  97   CO2 22 - 32 mmol/L 28  36  31   Calcium 8.9 - 10.3 mg/dL 9.7  9.9  9.4   Total Protein 6.5 - 8.1 g/dL 8.2  7.4  7.2   Total Bilirubin 0.3 - 1.2 mg/dL 0.9  0.5  0.8   Alkaline Phos 38 - 126 U/L 63  66  57   AST 15 - 41 U/L 23  18  23    ALT 0 - 44 U/L 21  24  16      DIAGNOSTIC IMAGING:  I have independently reviewed the relevant imaging and discussed with the patient.   WRAP UP:  All questions were answered. The patient knows to call the clinic with any problems, questions or concerns.  Medical decision making: Low  Time spent on visit: I spent 15 minutes counseling the patient face to face. The total time spent in the appointment was 22 minutes and more than 50% was on counseling.  Harriett Rush, PA-C  03/12/2023 10:21 AM

## 2023-03-12 ENCOUNTER — Other Ambulatory Visit: Payer: Self-pay

## 2023-03-12 ENCOUNTER — Inpatient Hospital Stay (HOSPITAL_BASED_OUTPATIENT_CLINIC_OR_DEPARTMENT_OTHER): Payer: 59 | Admitting: Physician Assistant

## 2023-03-12 VITALS — HR 70 | Temp 97.4°F | Resp 18 | Ht 67.0 in | Wt 137.7 lb

## 2023-03-12 DIAGNOSIS — D696 Thrombocytopenia, unspecified: Secondary | ICD-10-CM

## 2023-03-12 DIAGNOSIS — F17210 Nicotine dependence, cigarettes, uncomplicated: Secondary | ICD-10-CM | POA: Diagnosis not present

## 2023-03-12 DIAGNOSIS — D72819 Decreased white blood cell count, unspecified: Secondary | ICD-10-CM

## 2023-03-12 DIAGNOSIS — Z800 Family history of malignant neoplasm of digestive organs: Secondary | ICD-10-CM | POA: Diagnosis not present

## 2023-03-12 DIAGNOSIS — C9000 Multiple myeloma not having achieved remission: Secondary | ICD-10-CM

## 2023-03-12 DIAGNOSIS — D500 Iron deficiency anemia secondary to blood loss (chronic): Secondary | ICD-10-CM

## 2023-03-12 NOTE — Patient Instructions (Signed)
[facility] **   You were seen today by Tarri Abernethy PA-C for your low white blood cells and low platelets.   Your low white blood cells and low platelets are most likely medication side effects (from risperidone and mirtazapine). Your white blood cells are slightly lower than what they usually are, so we will recheck these levels in 1 month. As long as your white blood cells have improved, we will plan on seeing you for follow-up visit again in 1 year. You can continue to take your same dose of risperidone and mirtazapine, since it is only causing mild decrease in white blood cells and platelets.  FOLLOW-UP APPOINTMENT: Office visit in 1 year  ** Thank you for trusting me with your healthcare!  I strive to provide all of my patients with quality care at each visit.  If you receive a survey for this visit, I would be so grateful to you for taking the time to provide feedback.  Thank you in advance!  ~ Kayona Foor                   Dr. Derek Jack   &   Tarri Abernethy, PA-C   - - - - - - - - - - - - - - - - - -    Thank you for choosing [facility] at [facility] to provide your oncology and hematology care.  To afford each patient quality time with our provider, please arrive at least 15 minutes before your scheduled appointment time.   If you have a lab appointment with the [facility] please come in thru the Main Entrance and check in at the main information desk.  You need to re-schedule your appointment should you arrive 10 or more minutes late.  We strive to give you quality time with our providers, and arriving late affects you and other patients whose appointments are after yours.  Also, if you no show three or more times for appointments you may be dismissed from the clinic at the providers discretion.     Again, thank you for choosing [facility].  Our hope  is that these requests will decrease the amount of time that you wait before being seen by our physicians.       _____________________________________________________________  Should you have questions after your visit to [facility], please contact our office at [phone] and follow the prompts.  Our office hours are 8:00 a.m. and 4:30 p.m. Monday - Friday.  Please note that voicemails left after 4:00 p.m. may not be returned until the following business day.  We are closed weekends and major holidays.  You do have access to a nurse 24-7, just call the main number to the clinic [phone] and do not press any options, hold on the line and a nurse will answer the phone.    For prescription refill requests, have your pharmacy contact our office and allow 72 hours.

## 2023-04-11 ENCOUNTER — Inpatient Hospital Stay: Payer: 59 | Attending: Hematology

## 2023-04-11 DIAGNOSIS — D500 Iron deficiency anemia secondary to blood loss (chronic): Secondary | ICD-10-CM

## 2023-04-11 DIAGNOSIS — D72819 Decreased white blood cell count, unspecified: Secondary | ICD-10-CM | POA: Insufficient documentation

## 2023-04-11 DIAGNOSIS — D696 Thrombocytopenia, unspecified: Secondary | ICD-10-CM

## 2023-04-11 LAB — CBC WITH DIFFERENTIAL/PLATELET
Abs Immature Granulocytes: 0 10*3/uL (ref 0.00–0.07)
Basophils Absolute: 0 10*3/uL (ref 0.0–0.1)
Basophils Relative: 1 %
Eosinophils Absolute: 0 10*3/uL (ref 0.0–0.5)
Eosinophils Relative: 1 %
HCT: 43.3 % (ref 39.0–52.0)
Hemoglobin: 14.4 g/dL (ref 13.0–17.0)
Immature Granulocytes: 0 %
Lymphocytes Relative: 46 %
Lymphs Abs: 1.4 10*3/uL (ref 0.7–4.0)
MCH: 30.5 pg (ref 26.0–34.0)
MCHC: 33.3 g/dL (ref 30.0–36.0)
MCV: 91.7 fL (ref 80.0–100.0)
Monocytes Absolute: 0.4 10*3/uL (ref 0.1–1.0)
Monocytes Relative: 13 %
Neutro Abs: 1.2 10*3/uL — ABNORMAL LOW (ref 1.7–7.7)
Neutrophils Relative %: 39 %
Platelets: 113 10*3/uL — ABNORMAL LOW (ref 150–400)
RBC: 4.72 MIL/uL (ref 4.22–5.81)
RDW: 12.2 % (ref 11.5–15.5)
WBC: 3 10*3/uL — ABNORMAL LOW (ref 4.0–10.5)
nRBC: 0 % (ref 0.0–0.2)

## 2023-05-29 ENCOUNTER — Encounter: Payer: Self-pay | Admitting: Urology

## 2023-05-29 ENCOUNTER — Ambulatory Visit (INDEPENDENT_AMBULATORY_CARE_PROVIDER_SITE_OTHER): Payer: 59 | Admitting: Urology

## 2023-05-29 VITALS — BP 139/80 | HR 62 | Ht 67.0 in | Wt 137.7 lb

## 2023-05-29 DIAGNOSIS — N401 Enlarged prostate with lower urinary tract symptoms: Secondary | ICD-10-CM | POA: Diagnosis not present

## 2023-05-29 DIAGNOSIS — R351 Nocturia: Secondary | ICD-10-CM

## 2023-05-29 DIAGNOSIS — R972 Elevated prostate specific antigen [PSA]: Secondary | ICD-10-CM | POA: Diagnosis not present

## 2023-05-29 DIAGNOSIS — R350 Frequency of micturition: Secondary | ICD-10-CM

## 2023-05-29 LAB — URINALYSIS, ROUTINE W REFLEX MICROSCOPIC
Bilirubin, UA: NEGATIVE
Glucose, UA: NEGATIVE
Ketones, UA: NEGATIVE
Leukocytes,UA: NEGATIVE
Nitrite, UA: NEGATIVE
Protein,UA: NEGATIVE
RBC, UA: NEGATIVE
Specific Gravity, UA: 1.01 (ref 1.005–1.030)
Urobilinogen, Ur: 0.2 mg/dL (ref 0.2–1.0)
pH, UA: 7.5 (ref 5.0–7.5)

## 2023-05-29 NOTE — Progress Notes (Signed)
Subjective: 1. Elevated PSA   2. BPH with urinary obstruction   3. Nocturia   4. Urinary frequency      Consult requested by Colvin Caroli NP.  Kyle Wade is a 67 yo male who was found to have an elevated PSA of 8 with a 10% f/t ratio on 02/25/23.  He saw Dr. Nechama Guard for several visits prior to 2017 for frequency and nocturia. He has nocturia x 4 and daytime frequency.  He is on oxybutynin and tamsulosin.   His IPSS is 12.  He had weight loss 3 years ago but it has stabilized.  He has no bone pain.  He has chronic thrombocytopenia.  He has mitral regurgitation that is managed by cardiology.     IPSS     Row Name 05/29/23 1100         International Prostate Symptom Score   How often have you had the sensation of not emptying your bladder? Less than 1 in 5     How often have you had to urinate less than every two hours? Almost always     How often have you found you stopped and started again several times when you urinated? Not at All     How often have you found it difficult to postpone urination? Not at All     How often have you had a weak urinary stream? Not at All     How often have you had to strain to start urination? Less than half the time     How many times did you typically get up at night to urinate? 4 Times     Total IPSS Score 12       Quality of Life due to urinary symptoms   If you were to spend the rest of your life with your urinary condition just the way it is now how would you feel about that? Mixed              ROS:  Review of Systems  Eyes:  Positive for blurred vision.  Gastrointestinal:  Positive for diarrhea and heartburn.  Neurological:  Positive for dizziness and headaches.       Mental retardation.   Endo/Heme/Allergies:  Positive for polydipsia.    No Known Allergies  Past Medical History:  Diagnosis Date   Acute cholecystitis 01/25/2016   Anxiety    BPH (benign prostatic hyperplasia)    Chronic vomiting    GERD (gastroesophageal reflux disease)     Hypercholesteremia    Hypertension    Mental retardation    OSA (obstructive sleep apnea)    no cpap machine; could not tolerate.   Schatzki's ring    Schizophrenia [facility])     Past Surgical History:  Procedure Laterality Date   BIOPSY  05/14/2017   Procedure: BIOPSY;  Surgeon: [name], MD;  Location[facility];  Service: Endoscopy;;  gastric polyp bx   CHOLECYSTECTOMY N/A 01/26/2016   Procedure: LAPAROSCOPIC CHOLECYSTECTOMY;  Surgeon: [name], MD;  Location: [facility] ORS;  Service: General;  Laterality: N/A;   COLONOSCOPY N/A 09/21/2014   [name]: right-sided diverticulosis, internal hemorrhoids. Repeat in 10 years.    COLONOSCOPY WITH PROPOFOL N/A 05/14/2017   [name]: diverticulosis in ascending colon, otherwise normal   ESOPHAGOGASTRODUODENOSCOPY N/A 09/21/2014   [name]: Subtle non-critical Schatzki's ring s/p 56 F dilation, query occult cervical esophageal web. Hiatal hernia. Question gastroparesis.    ESOPHAGOGASTRODUODENOSCOPY (EGD) WITH PROPOFOL N/A 05/14/2017   [name]:  normal esophagus s/p empiric dilation, benign gastric polyps, normal duodenum   MALONEY DILATION N/A 09/21/2014   Procedure: MALONEY DILATION;  Surgeon: [name], MD;  [facility];  Service: Endoscopy;  Laterality: N/A;   MALONEY DILATION N/A 05/14/2017   Procedure: Elease Hashimoto DILATION;  Surgeon: [name], MD;  Location[facility];  Service: Endoscopy;  Laterality: N/A;   None     SAVORY DILATION N/A 09/21/2014   Procedure: SAVORY DILATION;  Surgeon: [name], MD;  Location: [facility];  Service: Endoscopy;  Laterality: N/A;   TEE WITHOUT CARDIOVERSION N/A 02/02/2018   Procedure: TRANSESOPHAGEAL ECHOCARDIOGRAM (TEE) WITH PROPOFOL;  Surgeon: [name], MD;  Location: [facility];  Service: Cardiovascular;  Laterality: N/A;    Social History   Socioeconomic History   Marital status: Single    Spouse name: Not on file   Number of children: Not on file    Years of education: Not on file   Highest education level: Not on file  Occupational History   Not on file  Tobacco Use   Smoking status: Every Day    Packs/day: 0.25    Years: 30.00    Additional pack years: 0.00    Total pack years: 7.50    Types: Cigarettes   Smokeless tobacco: Never   Tobacco comments:    4 cigarettes a day  Vaping Use   Vaping Use: Never used  Substance and Sexual Activity   Alcohol use: No   Drug use: No   Sexual activity: Never    Birth control/protection: None  Other Topics Concern   Not on file  Social History Narrative   Lives in Group Home.    Smokes cigarettes qd.   Able to smoke cigarettes at 8, 12, 4, 8pm.    Seen by [name] in [facility].    [facility].      Legal guardian: [employer]support[employer]administrative [employer].    Social Determinants of Health   Financial Resource Strain: Not on file  Food Insecurity: Not on file  Transportation Needs: Not on file  Physical Activity: Not on file  Stress: Not on file  Social Connections: Not on file  Intimate Partner Violence: Not on file    Family History  Problem Relation Age of Onset   Colon cancer Other        unknown   Hypertension Other    Hyperlipidemia Other     Anti-infectives: Anti-infectives (From admission, onward)    None       Current Outpatient Medications  Medication Sig Dispense Refill   amLODipine (NORVASC) 5 MG tablet TAKE (1) TABLET BY MOUTH ONCE DAILY. 30 tablet 6   buPROPion (WELLBUTRIN SR) 150 MG 12 hr tablet Take 150 mg by mouth 2 (two) times daily.     DEXILANT 60 MG capsule TAKE 1 CAPSULE BY MOUTH ONCE A DAY. 30 capsule 11   docusate sodium (COLACE) 100 MG capsule TAKE (1) CAPSULE BY MOUTH TWICE DAILY FOR CONSTIPATION. 60 capsule 3   ENSURE (ENSURE) USE 1 CAN BY MOUTH TWICE DAILY 2370 mL PRN   famotidine (PEPCID) 40 MG tablet Take 40 mg by mouth at bedtime.     fluticasone (FLONASE) 50 MCG/ACT nasal spray Place 2 sprays into both  nostrils daily.     mirtazapine (REMERON) 15 MG tablet TAKE 1 TABLET BY MOUTH DAILY 1 HOUR BEFORE BEDTIME. (Patient taking differently: Take 7.5 mg by mouth at  bedtime.) 30 tablet 11   oxybutynin (DITROPAN) 5 MG tablet TAKE 1 TABLET BY MOUTH EVERY MORNING. 30 tablet 11   potassium chloride (MICRO-K) 10 MEQ CR capsule Take 10 mEq by mouth 2 (two) times daily.     psyllium (METAMUCIL) 58.6 % powder Take 1 packet by mouth daily.     risperiDONE (RISPERDAL) 0.5 MG tablet Take 0.5 mg by mouth at bedtime.     tamsulosin (FLOMAX) 0.4 MG CAPS capsule TAKE 1 CAPSULE BY MOUTH ONCE DAILY 30 MINUTES AFTER THE SAME MEAL EACH DAY. 30 capsule 11   triamterene-hydrochlorothiazide (MAXZIDE-25) 37.5-25 MG tablet TAKE (1) TABLET BY MOUTH DAILY IN THE MORNING FOR HIGH BLOOD PRESSURE. 30 tablet 11   Vitamin D, Ergocalciferol, (DRISDOL) 50000 units CAPS capsule TAKE 1 CAPSULE BY MOUTH ONCE A MONTH ON THE 1ST. DO NOT CRUSH. 1 capsule 11   ammonium lactate (AMLACTIN) 12 % cream Apply 1 Application topically 2 (two) times daily. (Patient not taking: Reported on 05/29/2023)     No current facility-administered medications for this visit.     Objective: Vital signs in last 24 hours: BP 139/80   Pulse 62   Ht 5\' 7"  (1.702 m)   Wt 137 lb 11.2 oz (62.5 kg)   BMI 21.57 kg/m   Intake/Output from previous day: No intake/output data recorded. Intake/Output this shift: @IOTHISSHIFT @   Physical Exam Constitutional:      Comments: Low set ears.    Cardiovascular:     Rate and Rhythm: Normal rate and regular rhythm.     Heart sounds: Murmur heard.  Pulmonary:     Effort: Pulmonary effort is normal. No respiratory distress.     Breath sounds: Normal breath sounds.  Abdominal:     General: Abdomen is flat.     Palpations: Abdomen is soft.     Hernia: No hernia is present.  Genitourinary:    Comments: Uncirc phallus with adequate meatus. Scrotum, testes and epididymis normal. AP/NST with mass. Prostate 1.5+  benign. SV non-palpable.  Musculoskeletal:        General: No swelling. Normal range of motion.     Cervical back: Normal range of motion and neck supple.  Skin:    General: Skin is warm and dry.  Neurological:     General: No focal deficit present.     Mental Status: He is alert.     Comments: MR     Lab Results:  Results for orders placed or performed in visit on 05/29/23 (from the past 24 hour(s))  Urinalysis, Routine w reflex microscopic     Status: None   Collection Time: 05/29/23 11:17 AM  Result Value Ref Range   Specific Gravity, UA 1.010 1.005 - 1.030   pH, UA 7.5 5.0 - 7.5   Color, UA Yellow Yellow   Appearance Ur Clear Clear   Leukocytes,UA Negative Negative   Protein,UA Negative Negative/Trace   Glucose, UA Negative Negative   Ketones, UA Negative Negative   RBC, UA Negative Negative   Bilirubin, UA Negative Negative   Urobilinogen, Ur 0.2 0.2 - 1.0 mg/dL   Nitrite, UA Negative Negative   Microscopic Examination Comment    Narrative   Performed at:  [address] [facility] [address] [place], [place]  [id number] Lab Director: Chinita Pester MT, Phone:  [phone]    BMET No results for input(s): "NA", "K", "CL", "CO2", "GLUCOSE", "BUN", "CREATININE", "CALCIUM" in the last 72 hours. PT/INR No results for input(s): "LABPROT", "INR" in the  last 72 hours. ABG No results for input(s): "PHART", "HCO3" in the last 72 hours.  Invalid input(s): "PCO2", "PO2" PSA reviewed.   UA is clear.  Studies/Results: No results found. PCP records reviewed.  Assessment/Plan: Elevated PSA.   I will repeat a PSA today and since he is on risperdone I will get a Testosterone level too.  If the PSA remains elevated, he will need a prostate [place] and biopsy.  I have reviewed the risks of bleeding, infection and voiding difficulty.  He will need it done under MAC and he will need cardiology clearance.   Frequency and nocturia with BPH.  This is chronic and he will continue  current therapy.    Thrombocytopenia.  His platelet count is >100K so he shouldn't be at an increased risk of bleeding.   No orders of the defined types were placed in this encounter.    Orders Placed This Encounter  Procedures   Urinalysis, Routine w reflex microscopic   PSA, total and free   Testosterone     Return for Next available schedule biopsy under MAC with Dr. Ronne Binning. .    CC: Elsa Bucio NP.      Bjorn Pippin 05/30/2023

## 2023-05-30 LAB — PSA, TOTAL AND FREE
PSA, Free Pct: 19.8 %
PSA, Free: 1.25 ng/mL
Prostate Specific Ag, Serum: 6.3 ng/mL — ABNORMAL HIGH (ref 0.0–4.0)

## 2023-05-30 LAB — TESTOSTERONE: Testosterone: 685 ng/dL (ref 264–916)

## 2023-06-02 DIAGNOSIS — I739 Peripheral vascular disease, unspecified: Secondary | ICD-10-CM | POA: Diagnosis not present

## 2023-06-02 DIAGNOSIS — L110 Acquired keratosis follicularis: Secondary | ICD-10-CM | POA: Diagnosis not present

## 2023-06-02 DIAGNOSIS — B351 Tinea unguium: Secondary | ICD-10-CM | POA: Diagnosis not present

## 2023-06-12 ENCOUNTER — Telehealth: Payer: Self-pay

## 2023-06-12 DIAGNOSIS — R972 Elevated prostate specific antigen [PSA]: Secondary | ICD-10-CM

## 2023-06-12 NOTE — Telephone Encounter (Signed)
-----   Message from Bjorn Pippin, MD sent at 05/31/2023  7:21 AM EDT ----- PSA remains up.  He will need to go ahead and be scheduled for a prostate [place] and biopsy under MAC at [facility] OR with Dr. Madison Hickman ----- Message ----- From: Grier Rocher, CMA Sent: 05/30/2023   1:09 PM EDT To: Bjorn Pippin, MD  Please review.

## 2023-06-12 NOTE — Telephone Encounter (Signed)
Unable to reach patient or legal guardian at this time.  Pt is currently at a facility, I will continue to reach out to coordinate procedure with facility.

## 2023-06-30 ENCOUNTER — Telehealth: Payer: Self-pay | Admitting: *Deleted

## 2023-06-30 NOTE — Telephone Encounter (Signed)
Left voicemail with legal guardian requesting a call back to confirm biopsy with Dr. Ronne Binning as well as leave results from Dr. Annabell Howells.  Tentative date is 08/22.  Waiting for call back.

## 2023-06-30 NOTE — Telephone Encounter (Signed)
I spoke with Eldridge Scot. We have discussed possible surgery dates and 08/07/2023 was agreed upon by all parties. Patient given information about surgery date, what to expect pre-operatively and post operatively.    We discussed that a pre-op nurse will be calling to set up the pre-op visit that will take place prior to surgery. Informed patient that our office will communicate any additional care to be provided after surgery.    Patients questions or concerns were discussed during our call. Advised to call our office should there be any additional information, questions or concerns that arise. Patient verbalized understanding.

## 2023-06-30 NOTE — Telephone Encounter (Signed)
   Pre-operative Risk Assessment    Patient Name: Kyle Wade  DOB: 1956-02-27 MRN: [id number]      Request for Surgical Clearance    Procedure:   PROSTATE U/S AND Bx  Date of Surgery:  Clearance 08/07/23                                 Surgeon:  DR. Ronne Binning Surgeon's Group or Practice Name:  [facility]  Phone number:  [phone] Fax number:  [phone]   Type of Clearance Requested:   - Medical ; NO MEDICATIONS LISTED AS NEEDING TO BE HELD   Type of Anesthesia:  MAC   Additional requests/questions:    Elpidio Anis   06/30/2023, 3:34 PM

## 2023-07-01 NOTE — Telephone Encounter (Signed)
   Name: Kyle Wade  DOB: 1956-05-18  MRN: [id number]  Primary Cardiologist: Dina Rich, MD   Preoperative team, please contact this patient and set up a phone call appointment for further preoperative risk assessment. Please obtain consent and complete medication review. Thank you for your help.  I confirm that guidance regarding antiplatelet and oral anticoagulation therapy has been completed and, if necessary, noted below.   None requested    Ronney Asters, NP 07/01/2023, 12:54 PM [facility]

## 2023-07-01 NOTE — Telephone Encounter (Signed)
Pt resides in a group home. I s/w Melissa (DPR) and we have scheduled tele pre op appt 07/17/23 @ 10 am for the pt. Med rec and consent are done. Per Efraim Kaufmann, we will need to call Eldridge Scot, group home manager who will do the tele appt with the pt. Call [phone] # for [employer].

## 2023-07-01 NOTE — Telephone Encounter (Signed)
Pt resides in a group home. I s/w Melissa (DPR) and we have scheduled tele pre op appt 07/17/23 @ 10 am for the pt. Med rec and consent are done. Per Efraim Kaufmann, we will need to call Eldridge Scot, group home manager who will do the tele appt with the pt. Call [phone] # for [employer] [employer].       Patient Consent for Virtual Visit        Kyle Wade has provided verbal consent on 07/01/2023 for a virtual visit (video or telephone).   CONSENT FOR VIRTUAL VISIT FOR:  Kyle Wade  By participating in this virtual visit I agree to the following:  I hereby voluntarily request, consent and authorize [facility] and its employed or contracted physicians, physician assistants, nurse practitioners or other licensed health care professionals (the Practitioner), to provide me with telemedicine health care services (the "Services") as deemed necessary by the treating Practitioner. I acknowledge and consent to receive the Services by the Practitioner via telemedicine. I understand that the telemedicine visit will involve communicating with the Practitioner through live audiovisual communication technology and the disclosure of certain medical information by electronic transmission. I acknowledge that I have been given the opportunity to request an in-person assessment or other available alternative prior to the telemedicine visit and am voluntarily participating in the telemedicine visit.  I understand that I have the right to withhold or withdraw my consent to the use of telemedicine in the course of my care at any time, without affecting my right to future care or treatment, and that the Practitioner or I may terminate the telemedicine visit at any time. I understand that I have the right to inspect all information obtained and/or recorded in the course of the telemedicine visit and may receive copies of available information for a reasonable fee.  I understand that some of the potential risks of  receiving the Services via telemedicine include:  Delay or interruption in medical evaluation due to technological equipment failure or disruption; Information transmitted may not be sufficient (e.g. poor resolution of images) to allow for appropriate medical decision making by the Practitioner; and/or  In rare instances, security protocols could fail, causing a breach of personal health information.  Furthermore, I acknowledge that it is my responsibility to provide information about my medical history, conditions and care that is complete and accurate to the best of my ability. I acknowledge that Practitioner's advice, recommendations, and/or decision may be based on factors not within their control, such as incomplete or inaccurate data provided by me or distortions of diagnostic images or specimens that may result from electronic transmissions. I understand that the practice of medicine is not an exact science and that Practitioner makes no warranties or guarantees regarding treatment outcomes. I acknowledge that a copy of this consent can be made available to me via my patient portal [facility] MyChart), or I can request a printed copy by calling the office of [facility].    I understand that my insurance will be billed for this visit.   I have read or had this consent read to me. I understand the contents of this consent, which adequately explains the benefits and risks of the Services being provided via telemedicine.  I have been provided ample opportunity to ask questions regarding this consent and the Services and have had my questions answered to my satisfaction. I give my informed consent for the services to be provided through the use of telemedicine in my  medical care

## 2023-07-17 ENCOUNTER — Ambulatory Visit: Payer: 59 | Attending: Cardiology | Admitting: Student

## 2023-07-17 DIAGNOSIS — Z01810 Encounter for preprocedural cardiovascular examination: Secondary | ICD-10-CM | POA: Diagnosis not present

## 2023-07-17 NOTE — Progress Notes (Signed)
Virtual Visit via Telephone Note   Because of Kyle Wade's co-morbid illnesses, he is at least at moderate risk for complications without adequate follow up.  This format is felt to be most appropriate for this patient at this time.  The patient did not have access to video technology/had technical difficulties with video requiring transitioning to audio format only (telephone).  All issues noted in this document were discussed and addressed.  No physical exam could be performed with this format.  Please refer to the patient's chart for his consent to telehealth for [facility].  Evaluation Performed:  Preoperative cardiovascular risk assessment _____________   Date:  07/17/2023   Patient ID:  Kyle Wade, DOB [birth year], MRN [id number] Patient Location:  Home Provider location:   Office  Primary Care Provider:  [facility] Primary Cardiologist:  Dina Rich, MD  Chief Complaint / Patient Profile   67 y.o. y/o male with a h/o mitral regurgitation/heart murmur, hypertension schizophrenia, intellectual disability, GERD, tobacco abuse who is pending prostate ultrasound and biopsy by Dr. Ronne Binning and presents today for telephonic preoperative cardiovascular risk assessment.  History of Present Illness    Kyle Wade is a 67 y.o. male who presents via Web designer for a telehealth visit today.  Pt was last seen in cardiology clinic on 02/18/2023 by Dr. Wyline Mood.  At that time Kyle Wade was doing well.  The patient is now pending procedure as outlined above. Phone call today is facilitated by LaTonya at the group home per [facility]). Patient is available with Kyle Wade and is able to verify his DOB. Patient denies shortness of breath or dyspnea on exertion. No chest pain, pressure, or tightness. Denies lower extremity edema, orthopnea, or PND. No palpitations. He is very active doing [employer] on Whe[employer] 5 days a week and playing basketball or  kickball at the park 3-4 days a week.   Past Medical History    Past Medical History:  Diagnosis Date   Acute cholecystitis 01/25/2016   Anxiety    BPH (benign prostatic hyperplasia)    Chronic vomiting    GERD (gastroesophageal reflux disease)    Hypercholesteremia    Hypertension    Mental retardation    OSA (obstructive sleep apnea)    no cpap machine; could not tolerate.   Schatzki's ring    Schizophrenia [facility])    Past Surgical History:  Procedure Laterality Date   BIOPSY  05/14/2017   Procedure: BIOPSY;  Surgeon: [name], MD;  [facility];  Service: Endoscopy;;  gastric polyp bx   CHOLECYSTECTOMY N/A 01/26/2016   Procedure: LAPAROSCOPIC CHOLECYSTECTOMY;  Surgeon: [name], MD;  Location: [facility] ORS;  Service: General;  Laterality: N/A;   COLONOSCOPY N/A 09/21/2014   [name]: right-sided diverticulosis, internal hemorrhoids. Repeat in 10 years.    COLONOSCOPY WITH PROPOFOL N/A 05/14/2017   [name]: diverticulosis in ascending colon, otherwise normal   ESOPHAGOGASTRODUODENOSCOPY N/A 09/21/2014   [name]: Subtle non-critical Schatzki's ring s/p 56 F dilation, query occult cervical esophageal web. Hiatal hernia. Question gastroparesis.    ESOPHAGOGASTRODUODENOSCOPY (EGD) WITH PROPOFOL N/A 05/14/2017   [name]: normal esophagus s/p empiric dilation, benign gastric polyps, normal duodenum   MALONEY DILATION N/A 09/21/2014   Procedure: Elease Hashimoto DILATION;  Surgeon: [name], MD;  [facility];  Service: Endoscopy;  Laterality: N/A;   MALONEY DILATION N/A 05/14/2017   Procedure: Elease Hashimoto DILATION;  Surgeon: [name], MD;  Location: [facility];  Service: Endoscopy;  Laterality: N/A;   None     SAVORY DILATION N/A 09/21/2014   Procedure: SAVORY DILATION;  Surgeon: [name], MD;  Location: [facility];  Service: Endoscopy;  Laterality: N/A;   TEE WITHOUT CARDIOVERSION N/A 02/02/2018   Procedure: TRANSESOPHAGEAL ECHOCARDIOGRAM (TEE)  WITH PROPOFOL;  Surgeon: [name], MD;  Location: [facility];  Service: Cardiovascular;  Laterality: N/A;    Allergies  No Known Allergies  Home Medications    Prior to Admission medications   Medication Sig Start Date End Date Taking? Authorizing Provider  amLODipine (NORVASC) 5 MG tablet TAKE (1) TABLET BY MOUTH ONCE DAILY. 02/04/22   Antoine Poche, MD  ammonium lactate (AMLACTIN) 12 % cream Apply 1 Application topically 2 (two) times daily. Patient not taking: Reported on 05/29/2023 02/27/22   [provider]  buPROPion (WELLBUTRIN SR) 150 MG 12 hr tablet Take 150 mg by mouth 2 (two) times daily.    [provider]  DEXILANT 60 MG capsule TAKE 1 CAPSULE BY MOUTH ONCE A DAY. 07/12/20   Gelene Mink, NP  docusate sodium (COLACE) 100 MG capsule TAKE (1) CAPSULE BY MOUTH TWICE DAILY FOR CONSTIPATION. 12/15/19   Anice Paganini, NP  ENSURE (ENSURE) USE 1 CAN BY MOUTH TWICE DAILY 10/07/18   Gelene Mink, NP  famotidine (PEPCID) 40 MG tablet Take 40 mg by mouth at bedtime.    [provider]  fluticasone (FLONASE) 50 MCG/ACT nasal spray Place 2 sprays into both nostrils daily.    [provider]  mirtazapine (REMERON) 15 MG tablet TAKE 1 TABLET BY MOUTH DAILY 1 HOUR BEFORE BEDTIME. Patient taking differently: Take 7.5 mg by mouth at bedtime. 12/24/17   Aliene Beams, MD  oxybutynin (DITROPAN) 5 MG tablet TAKE 1 TABLET BY MOUTH EVERY MORNING. 01/27/18   Aliene Beams, MD  potassium chloride (MICRO-K) 10 MEQ CR capsule Take 10 mEq by mouth 2 (two) times daily.    [provider]  psyllium (METAMUCIL) 58.6 % powder Take 1 packet by mouth daily.    [provider]  risperiDONE (RISPERDAL) 0.5 MG tablet Take 0.5 mg by mouth at bedtime. 07/09/22   [provider]  tamsulosin (FLOMAX) 0.4 MG CAPS capsule TAKE 1 CAPSULE BY MOUTH ONCE DAILY 30 MINUTES AFTER THE SAME MEAL EACH DAY. 01/27/18   Aliene Beams, MD   triamterene-hydrochlorothiazide (MAXZIDE-25) 37.5-25 MG tablet TAKE (1) TABLET BY MOUTH DAILY IN THE MORNING FOR HIGH BLOOD PRESSURE. 01/27/18   Aliene Beams, MD  Vitamin D, Ergocalciferol, (DRISDOL) 50000 units CAPS capsule TAKE 1 CAPSULE BY MOUTH ONCE A MONTH ON THE 1ST. DO NOT CRUSH. 03/17/18   Eustace Moore, MD    Physical Exam    Vital Signs:  Kyle Wade does not have vital signs available for review today.  Given telephonic nature of communication, physical exam is limited. AAOx3. NAD. Normal affect.  Speech and respirations are unlabored.  Accessory Clinical Findings    None  Assessment & Plan    Primary Cardiologist: Dina Rich, MD  Preoperative cardiovascular risk assessment.  Prostate ultrasound and biopsy with Dr. Ronne Binning on 08/07/2023.  Chart reviewed as part of pre-operative protocol coverage. According to the RCRI, patient has a 0.4% risk of MACE. Patient reports activity equivalent to >4.0 METS (Meals on Wheels every week day, kickball/basketball at the park 3-4 days a week).   Given past medical history and time since last visit,  based on ACC/AHA guidelines, Kyle Wade would be at acceptable risk for the planned procedure without further cardiovascular testing.   Patient was advised that if he develops new symptoms prior to surgery to contact our office to arrange a follow-up appointment.  he verbalized understanding.  I will route this recommendation to the requesting party via Epic fax function.  Please call with questions.  Time:   Today, I have spent 5 minutes with the patient with telehealth technology discussing medical history, symptoms, and management plan.     Carlos Levering, NP  07/17/2023, 8:13 AM

## 2023-07-23 NOTE — Addendum Note (Signed)
Addended by: Grier Rocher on: 07/23/2023 09:45 AM   Modules accepted: Orders

## 2023-08-01 NOTE — Patient Instructions (Signed)
Kyle Wade  08/01/2023     @PREFPERIOPPHARMACY @   Your procedure is scheduled on  08/07/2023.   Report to [facility] at  0830  A.M.   Call this number if you have problems the morning of surgery:  [phone]  If you experience any cold or flu symptoms such as cough, fever, chills, shortness of breath, etc. between now and your scheduled surgery, please notify us at the above number.   Remember:  Do not eat or drink after midnight.      Take these medicines the morning of surgery with A SIP OF WATER       amlodipine, wellbutrin, dexilant, ditropan, risperidone.     Do not wear jewelry, make-up or nail polish, including gel polish,  artificial nails, or any other type of covering on natural nails (fingers and  toes).  Do not wear lotions, powders, or perfumes, or deodorant.  Do not shave 48 hours prior to surgery.  Men may shave face and neck.  Do not bring valuables to the hospital.  [facility] is not responsible for any belongings or valuables.  Contacts, dentures or bridgework may not be worn into surgery.  Leave your suitcase in the car.  After surgery it may be brought to your room.  For patients admitted to the hospital, discharge time will be determined by your treatment team.  Patients discharged the day of surgery will not be allowed to drive home and must have someone with them for 24 hours.    Special instructions:   DO NOT smoke tobacco or vape for 24 hours before your procedure.  Please read over the following fact sheets that you were given. Coughing and Deep Breathing, Surgical Site Infection Prevention, Anesthesia Post-op Instructions, and Care and Recovery After Surgery      General Anesthesia, Adult, Care After The following information offers guidance on how to care for yourself after your procedure. Your health care provider may also give you more specific instructions. If you have problems or questions, contact your health care  provider. What can I expect after the procedure? After the procedure, it is common for people to: Have pain or discomfort at the IV site. Have nausea or vomiting. Have a sore throat or hoarseness. Have trouble concentrating. Feel cold or chills. Feel weak, sleepy, or tired (fatigue). Have soreness and body aches. These can affect parts of the body that were not involved in surgery. Follow these instructions at home: For the time period you were told by your health care provider:  Rest. Do not participate in activities where you could fall or become injured. Do not drive or use machinery. Do not drink alcohol. Do not take sleeping pills or medicines that cause drowsiness. Do not make important decisions or sign legal documents. Do not take care of children on your own. General instructions Drink enough fluid to keep your urine pale yellow. If you have sleep apnea, surgery and certain medicines can increase your risk for breathing problems. Follow instructions from your health care provider about wearing your sleep device: Anytime you are sleeping, including during daytime naps. While taking prescription pain medicines, sleeping medicines, or medicines that make you drowsy. Return to your normal activities as told by your health care provider. Ask your health care provider what activities are safe for you. Take over-the-counter and prescription medicines only as told by your health care provider. Do not use any products that contain nicotine or tobacco. These  products include cigarettes, chewing tobacco, and vaping devices, such as e-cigarettes. These can delay incision healing after surgery. If you need help quitting, ask your health care provider. Contact a health care provider if: You have nausea or vomiting that does not get better with medicine. You vomit every time you eat or drink. You have pain that does not get better with medicine. You cannot urinate or have bloody urine. You  develop a skin rash. You have a fever. Get help right away if: You have trouble breathing. You have chest pain. You vomit blood. These symptoms may be an emergency. Get help right away. Call 911. Do not wait to see if the symptoms will go away. Do not drive yourself to the hospital. Summary After the procedure, it is common to have a sore throat, hoarseness, nausea, vomiting, or to feel weak, sleepy, or fatigue. For the time period you were told by your health care provider, do not drive or use machinery. Get help right away if you have difficulty breathing, have chest pain, or vomit blood. These symptoms may be an emergency. This information is not intended to replace advice given to you by your health care provider. Make sure you discuss any questions you have with your health care provider. Document Revised: 03/01/2022 Document Reviewed: 03/01/2022 Elsevier Patient Education  2024 [employer] How to Use Chlorhexidine Before Surgery Chlorhexidine gluconate (CHG) is a germ-killing (antiseptic) solution that is used to clean the skin. It can get rid of the bacteria that normally live on the skin and can keep them away for about 24 hours. To clean your skin with CHG, you may be given: A CHG solution to use in the shower or as part of a sponge bath. A prepackaged cloth that contains CHG. Cleaning your skin with CHG may help lower the risk for infection: While you are staying in the intensive care unit of the hospital. If you have a vascular access, such as a central line, to provide short-term or long-term access to your veins. If you have a catheter to drain urine from your bladder. If you are on a ventilator. A ventilator is a machine that helps you breathe by moving air in and out of your lungs. After surgery. What are the risks? Risks of using CHG include: A skin reaction. Hearing loss, if CHG gets in your ears and you have a perforated eardrum. Eye injury, if CHG gets in your eyes  and is not rinsed out. The CHG product catching fire. Make sure that you avoid smoking and flames after applying CHG to your skin. Do not use CHG: If you have a chlorhexidine allergy or have previously reacted to chlorhexidine. On babies younger than 62 months of age. How to use CHG solution Use CHG only as told by your health care provider, and follow the instructions on the label. Use the full amount of CHG as directed. Usually, this is one bottle. During a shower Follow these steps when using CHG solution during a shower (unless your health care provider gives you different instructions): Start the shower. Use your normal soap and shampoo to wash your face and hair. Turn off the shower or move out of the shower stream. Pour the CHG onto a clean washcloth. Do not use any type of brush or rough-edged sponge. Starting at your neck, lather your body down to your toes. Make sure you follow these instructions: If you will be having surgery, pay special attention to the part of your body  where you will be having surgery. Scrub this area for at least 1 minute. Do not use CHG on your head or face. If the solution gets into your ears or eyes, rinse them well with water. Avoid your genital area. Avoid any areas of skin that have broken skin, cuts, or scrapes. Scrub your back and under your arms. Make sure to wash skin folds. Let the lather sit on your skin for 1-2 minutes or as long as told by your health care provider. Thoroughly rinse your entire body in the shower. Make sure that all body creases and crevices are rinsed well. Dry off with a clean towel. Do not put any substances on your body afterward--such as powder, lotion, or perfume--unless you are told to do so by your health care provider. Only use lotions that are recommended by the manufacturer. Put on clean clothes or pajamas. If it is the night before your surgery, sleep in clean sheets.  During a sponge bath Follow these steps when  using CHG solution during a sponge bath (unless your health care provider gives you different instructions): Use your normal soap and shampoo to wash your face and hair. Pour the CHG onto a clean washcloth. Starting at your neck, lather your body down to your toes. Make sure you follow these instructions: If you will be having surgery, pay special attention to the part of your body where you will be having surgery. Scrub this area for at least 1 minute. Do not use CHG on your head or face. If the solution gets into your ears or eyes, rinse them well with water. Avoid your genital area. Avoid any areas of skin that have broken skin, cuts, or scrapes. Scrub your back and under your arms. Make sure to wash skin folds. Let the lather sit on your skin for 1-2 minutes or as long as told by your health care provider. Using a different clean, wet washcloth, thoroughly rinse your entire body. Make sure that all body creases and crevices are rinsed well. Dry off with a clean towel. Do not put any substances on your body afterward--such as powder, lotion, or perfume--unless you are told to do so by your health care provider. Only use lotions that are recommended by the manufacturer. Put on clean clothes or pajamas. If it is the night before your surgery, sleep in clean sheets. How to use CHG prepackaged cloths Only use CHG cloths as told by your health care provider, and follow the instructions on the label. Use the CHG cloth on clean, dry skin. Do not use the CHG cloth on your head or face unless your health care provider tells you to. When washing with the CHG cloth: Avoid your genital area. Avoid any areas of skin that have broken skin, cuts, or scrapes. Before surgery Follow these steps when using a CHG cloth to clean before surgery (unless your health care provider gives you different instructions): Using the CHG cloth, vigorously scrub the part of your body where you will be having surgery. Scrub  using a back-and-forth motion for 3 minutes. The area on your body should be completely wet with CHG when you are done scrubbing. Do not rinse. Discard the cloth and let the area air-dry. Do not put any substances on the area afterward, such as powder, lotion, or perfume. Put on clean clothes or pajamas. If it is the night before your surgery, sleep in clean sheets.  For general bathing Follow these steps when using CHG cloths for  general bathing (unless your health care provider gives you different instructions). Use a separate CHG cloth for each area of your body. Make sure you wash between any folds of skin and between your fingers and toes. Wash your body in the following order, switching to a new cloth after each step: The front of your neck, shoulders, and chest. Both of your arms, under your arms, and your hands. Your stomach and groin area, avoiding the genitals. Your right leg and foot. Your left leg and foot. The back of your neck, your back, and your buttocks. Do not rinse. Discard the cloth and let the area air-dry. Do not put any substances on your body afterward--such as powder, lotion, or perfume--unless you are told to do so by your health care provider. Only use lotions that are recommended by the manufacturer. Put on clean clothes or pajamas. Contact a health care provider if: Your skin gets irritated after scrubbing. You have questions about using your solution or cloth. You swallow any chlorhexidine. Call your local poison control center (8100679383 in the [place] Get help right away if: Your eyes itch badly, or they become very red or swollen. Your skin itches badly and is red or swollen. Your hearing changes. You have trouble seeing. You have swelling or tingling in your mouth or throat. You have trouble breathing. These symptoms may represent a serious problem that is an emergency. Do not wait to see if the symptoms will go away. Get medical help right away. Call  your local emergency services (911 in the [place] Do not drive yourself to the hospital. Summary Chlorhexidine gluconate (CHG) is a germ-killing (antiseptic) solution that is used to clean the skin. Cleaning your skin with CHG may help to lower your risk for infection. You may be given CHG to use for bathing. It may be in a bottle or in a prepackaged cloth to use on your skin. Carefully follow your health care provider's instructions and the instructions on the product label. Do not use CHG if you have a chlorhexidine allergy. Contact your health care provider if your skin gets irritated after scrubbing. This information is not intended to replace advice given to you by your health care provider. Make sure you discuss any questions you have with your health care provider. Document Revised: 04/01/2022 Document Reviewed: 02/12/2021 Elsevier Patient Education  2023 [employer].

## 2023-08-04 ENCOUNTER — Encounter (HOSPITAL_COMMUNITY): Payer: Self-pay

## 2023-08-04 ENCOUNTER — Encounter (HOSPITAL_COMMUNITY)
Admission: RE | Admit: 2023-08-04 | Discharge: 2023-08-04 | Disposition: A | Discharge status: Home / Self Care | Payer: 59 | Source: Ambulatory Visit | Attending: Urology | Admitting: Urology

## 2023-08-04 VITALS — BP 132/69 | HR 74 | Temp 97.8°F | Resp 18 | Ht 67.0 in | Wt 137.8 lb

## 2023-08-04 DIAGNOSIS — D72818 Other decreased white blood cell count: Secondary | ICD-10-CM | POA: Insufficient documentation

## 2023-08-04 DIAGNOSIS — Z01812 Encounter for preprocedural laboratory examination: Secondary | ICD-10-CM | POA: Diagnosis not present

## 2023-08-04 DIAGNOSIS — Z79899 Other long term (current) drug therapy: Secondary | ICD-10-CM | POA: Insufficient documentation

## 2023-08-04 LAB — CBC WITH DIFFERENTIAL/PLATELET
Abs Immature Granulocytes: 0 10*3/uL (ref 0.00–0.07)
Basophils Absolute: 0 10*3/uL (ref 0.0–0.1)
Basophils Relative: 1 %
Eosinophils Absolute: 0 10*3/uL (ref 0.0–0.5)
Eosinophils Relative: 1 %
HCT: 41.5 % (ref 39.0–52.0)
Hemoglobin: 14 g/dL (ref 13.0–17.0)
Immature Granulocytes: 0 %
Lymphocytes Relative: 39 %
Lymphs Abs: 1.2 10*3/uL (ref 0.7–4.0)
MCH: 30.6 pg (ref 26.0–34.0)
MCHC: 33.7 g/dL (ref 30.0–36.0)
MCV: 90.8 fL (ref 80.0–100.0)
Monocytes Absolute: 0.3 10*3/uL (ref 0.1–1.0)
Monocytes Relative: 9 %
Neutro Abs: 1.6 10*3/uL — ABNORMAL LOW (ref 1.7–7.7)
Neutrophils Relative %: 50 %
Platelets: 121 10*3/uL — ABNORMAL LOW (ref 150–400)
RBC: 4.57 MIL/uL (ref 4.22–5.81)
RDW: 12 % (ref 11.5–15.5)
WBC: 3.2 10*3/uL — ABNORMAL LOW (ref 4.0–10.5)
nRBC: 0 % (ref 0.0–0.2)

## 2023-08-04 LAB — BASIC METABOLIC PANEL
Anion gap: 9 (ref 5–15)
BUN: 11 mg/dL (ref 8–23)
CO2: 27 mmol/L (ref 22–32)
Calcium: 9 mg/dL (ref 8.9–10.3)
Chloride: 96 mmol/L — ABNORMAL LOW (ref 98–111)
Creatinine, Ser: 1.07 mg/dL (ref 0.61–1.24)
GFR, Estimated: >60 mL/min (ref >60)
Glucose, Bld: 146 mg/dL — ABNORMAL HIGH (ref 70–99)
Potassium: 3.5 mmol/L (ref 3.5–5.1)
Sodium: 132 mmol/L — ABNORMAL LOW (ref 135–145)

## 2023-08-06 NOTE — Pre-Procedure Instructions (Signed)
Spoke with [name], Legal guardian for DSS. She will be here 08/07/2023 @ 0830 to sing consent for surgery.

## 2023-08-07 ENCOUNTER — Other Ambulatory Visit: Payer: Self-pay | Admitting: Urology

## 2023-08-07 ENCOUNTER — Ambulatory Visit (HOSPITAL_COMMUNITY)
Admission: RE | Admit: 2023-08-07 | Discharge: 2023-08-07 | Disposition: A | Discharge status: Home / Self Care | Payer: 59 | Source: Ambulatory Visit | Attending: Urology | Admitting: Urology

## 2023-08-07 ENCOUNTER — Ambulatory Visit (HOSPITAL_BASED_OUTPATIENT_CLINIC_OR_DEPARTMENT_OTHER): Payer: 59 | Admitting: Anesthesiology

## 2023-08-07 ENCOUNTER — Ambulatory Visit (HOSPITAL_COMMUNITY): Payer: 59 | Admitting: Anesthesiology

## 2023-08-07 ENCOUNTER — Encounter (HOSPITAL_COMMUNITY): Payer: Self-pay | Admitting: Urology

## 2023-08-07 ENCOUNTER — Encounter (HOSPITAL_COMMUNITY)
Admission: RE | Disposition: A | Discharge status: Home / Self Care | Payer: Self-pay | Source: Home / Self Care | Attending: Urology

## 2023-08-07 ENCOUNTER — Ambulatory Visit (HOSPITAL_COMMUNITY)
Admission: RE | Admit: 2023-08-07 | Discharge: 2023-08-07 | Disposition: A | Discharge status: Home / Self Care | Payer: 59 | Attending: Urology | Admitting: Urology

## 2023-08-07 DIAGNOSIS — F209 Schizophrenia, unspecified: Secondary | ICD-10-CM | POA: Insufficient documentation

## 2023-08-07 DIAGNOSIS — K219 Gastro-esophageal reflux disease without esophagitis: Secondary | ICD-10-CM | POA: Insufficient documentation

## 2023-08-07 DIAGNOSIS — F79 Unspecified intellectual disabilities: Secondary | ICD-10-CM | POA: Diagnosis not present

## 2023-08-07 DIAGNOSIS — N401 Enlarged prostate with lower urinary tract symptoms: Secondary | ICD-10-CM | POA: Diagnosis present

## 2023-08-07 DIAGNOSIS — D075 Carcinoma in situ of prostate: Secondary | ICD-10-CM | POA: Insufficient documentation

## 2023-08-07 DIAGNOSIS — Z593 Problems related to living in residential institution: Secondary | ICD-10-CM | POA: Insufficient documentation

## 2023-08-07 DIAGNOSIS — N138 Other obstructive and reflux uropathy: Secondary | ICD-10-CM | POA: Insufficient documentation

## 2023-08-07 DIAGNOSIS — Z79899 Other long term (current) drug therapy: Secondary | ICD-10-CM | POA: Diagnosis not present

## 2023-08-07 DIAGNOSIS — N4231 Prostatic intraepithelial neoplasia: Secondary | ICD-10-CM

## 2023-08-07 DIAGNOSIS — R972 Elevated prostate specific antigen [PSA]: Secondary | ICD-10-CM | POA: Diagnosis not present

## 2023-08-07 DIAGNOSIS — F17210 Nicotine dependence, cigarettes, uncomplicated: Secondary | ICD-10-CM | POA: Insufficient documentation

## 2023-08-07 DIAGNOSIS — I340 Nonrheumatic mitral (valve) insufficiency: Secondary | ICD-10-CM | POA: Insufficient documentation

## 2023-08-07 DIAGNOSIS — I10 Essential (primary) hypertension: Secondary | ICD-10-CM | POA: Diagnosis not present

## 2023-08-07 DIAGNOSIS — F419 Anxiety disorder, unspecified: Secondary | ICD-10-CM | POA: Diagnosis not present

## 2023-08-07 HISTORY — PX: HIGH INTENSITY FOCUSED ULTRASOUND (HIFU) OF THE PROSTATE: SHX6793

## 2023-08-07 HISTORY — PX: PROSTATE BIOPSY: SHX241

## 2023-08-07 SURGERY — BIOPSY, PROSTATE
Anesthesia: General | Site: Prostate

## 2023-08-07 MED ORDER — SODIUM CHLORIDE 0.9 % IV SOLN: INTRAVENOUS | Status: DC | PRN

## 2023-08-07 MED ORDER — DEXAMETHASONE SODIUM PHOSPHATE 10 MG/ML IJ SOLN
INTRAMUSCULAR | Status: DC | PRN
  Administered 2023-08-07: 5 mg via INTRAVENOUS

## 2023-08-07 MED ORDER — GLYCOPYRROLATE PF 0.2 MG/ML IJ SOSY
PREFILLED_SYRINGE | INTRAMUSCULAR | Status: AC
  Filled 2023-08-07: qty 1

## 2023-08-07 MED ORDER — MIDAZOLAM HCL 2 MG/2ML IJ SOLN
INTRAMUSCULAR | Status: AC
  Filled 2023-08-07: qty 2

## 2023-08-07 MED ORDER — OXYCODONE HCL 5 MG/5ML PO SOLN: 5.0000 mg | Freq: Once | ORAL | Status: DC | PRN

## 2023-08-07 MED ORDER — PROPOFOL 500 MG/50ML IV EMUL
INTRAVENOUS | Status: DC | PRN
  Administered 2023-08-07: 75 ug/kg/min via INTRAVENOUS

## 2023-08-07 MED ORDER — MIDAZOLAM HCL 5 MG/5ML IJ SOLN
INTRAMUSCULAR | Status: DC | PRN
  Administered 2023-08-07 (×2): 1 mg via INTRAVENOUS

## 2023-08-07 MED ORDER — PHENYLEPHRINE HCL (PRESSORS) 10 MG/ML IV SOLN
INTRAVENOUS | Status: DC | PRN
  Administered 2023-08-07 (×2): 100 ug via INTRAVENOUS

## 2023-08-07 MED ORDER — ONDANSETRON HCL 4 MG/2ML IJ SOLN
INTRAMUSCULAR | Status: AC
  Filled 2023-08-07: qty 2

## 2023-08-07 MED ORDER — PROPOFOL 500 MG/50ML IV EMUL
INTRAVENOUS | Status: AC
  Filled 2023-08-07: qty 50

## 2023-08-07 MED ORDER — GLYCOPYRROLATE PF 0.2 MG/ML IJ SOSY
PREFILLED_SYRINGE | INTRAMUSCULAR | Status: DC | PRN
Start: 2023-08-07 — End: 2023-08-07
  Administered 2023-08-07 (×2): .1 mg via INTRAVENOUS

## 2023-08-07 MED ORDER — SODIUM CHLORIDE 0.9 % IV SOLN
2.0000 g | INTRAVENOUS | Status: AC
  Administered 2023-08-07: 2 g via INTRAVENOUS
  Filled 2023-08-07: qty 20

## 2023-08-07 MED ORDER — OXYCODONE HCL 5 MG PO TABS: 5.0000 mg | ORAL_TABLET | Freq: Once | ORAL | Status: DC | PRN

## 2023-08-07 MED ORDER — LACTATED RINGERS IV SOLN: INTRAVENOUS | Status: DC | PRN

## 2023-08-07 MED ORDER — LIDOCAINE 2% (20 MG/ML) 5 ML SYRINGE
INTRAMUSCULAR | Status: DC | PRN
  Administered 2023-08-07: 50 mg via INTRAVENOUS

## 2023-08-07 MED ORDER — LIDOCAINE HCL (PF) 2 % IJ SOLN
INTRAMUSCULAR | Status: AC
  Filled 2023-08-07: qty 5

## 2023-08-07 MED ORDER — FENTANYL CITRATE (PF) 100 MCG/2ML IJ SOLN
INTRAMUSCULAR | Status: DC | PRN
  Administered 2023-08-07: 50 ug via INTRAVENOUS

## 2023-08-07 MED ORDER — PROPOFOL 10 MG/ML IV BOLUS
INTRAVENOUS | Status: AC
  Filled 2023-08-07: qty 20

## 2023-08-07 MED ORDER — ONDANSETRON HCL 4 MG/2ML IJ SOLN
INTRAMUSCULAR | Status: DC | PRN
  Administered 2023-08-07: 4 mg via INTRAVENOUS

## 2023-08-07 MED ORDER — PROPOFOL 10 MG/ML IV BOLUS
INTRAVENOUS | Status: DC | PRN
  Administered 2023-08-07: 30 mg via INTRAVENOUS

## 2023-08-07 MED ORDER — FENTANYL CITRATE PF 50 MCG/ML IJ SOSY: 25.0000 ug | PREFILLED_SYRINGE | INTRAMUSCULAR | Status: DC | PRN

## 2023-08-07 MED ORDER — FENTANYL CITRATE (PF) 100 MCG/2ML IJ SOLN
INTRAMUSCULAR | Status: AC
  Filled 2023-08-07: qty 2

## 2023-08-07 MED ORDER — ONDANSETRON HCL 4 MG/2ML IJ SOLN: 4.0000 mg | Freq: Once | INTRAMUSCULAR | Status: DC | PRN

## 2023-08-07 MED ORDER — GENTAMICIN SULFATE 40 MG/ML IJ SOLN
5.0000 mg/kg | INTRAVENOUS | Status: AC
  Administered 2023-08-07: 312.4 mg via INTRAVENOUS
  Filled 2023-08-07: qty 7.75

## 2023-08-07 SURGICAL SUPPLY — 5 items
GAUZE SPONGE 4X4 12PLY STRL (GAUZE/BANDAGES/DRESSINGS) ×1 IMPLANT
GLOVE BIO SURGEON STRL SZ8 (GLOVE) ×1 IMPLANT
GLOVE BIOGEL PI IND STRL 7.0 (GLOVE) ×2 IMPLANT
PAD ARMBOARD 7.5X6 YLW CONV (MISCELLANEOUS) ×1 IMPLANT
TOWEL OR 17X24 6PK STRL BLUE (TOWEL DISPOSABLE) ×1 IMPLANT

## 2023-08-07 NOTE — Anesthesia Preprocedure Evaluation (Signed)
Anesthesia Evaluation  Patient identified by MRN, date of birth, ID band Patient awake    Reviewed: Allergy & Precautions, H&P , NPO status , Patient's Chart, lab work & pertinent test results, reviewed documented beta blocker date and time   Airway Mallampati: II  TM Distance: >3 FB Neck ROM: full    Dental no notable dental hx.    Pulmonary neg pulmonary ROS, sleep apnea , Current Smoker   Pulmonary exam normal breath sounds clear to auscultation       Cardiovascular Exercise Tolerance: Good hypertension, negative cardio ROS  Rhythm:regular Rate:Normal     Neuro/Psych  PSYCHIATRIC DISORDERS Anxiety   Schizophrenia  negative neurological ROS  negative psych ROS   GI/Hepatic negative GI ROS, Neg liver ROS,GERD  ,,  Endo/Other  negative endocrine ROS    Renal/GU negative Renal ROS  negative genitourinary   Musculoskeletal   Abdominal   Peds  Hematology negative hematology ROS (+)   Anesthesia Other Findings   Reproductive/Obstetrics negative OB ROS                             Anesthesia Physical Anesthesia Plan  ASA: 2  Anesthesia Plan: General   Post-op Pain Management:    Induction:   PONV Risk Score and Plan: Propofol infusion  Airway Management Planned:   Additional Equipment:   Intra-op Plan:   Post-operative Plan:   Informed Consent: I have reviewed the patients History and Physical, chart, labs and discussed the procedure including the risks, benefits and alternatives for the proposed anesthesia with the patient or authorized representative who has indicated his/her understanding and acceptance.     Dental Advisory Given  Plan Discussed with: CRNA  Anesthesia Plan Comments:        Anesthesia Quick Evaluation

## 2023-08-07 NOTE — H&P (Signed)
Subjective: 1. Elevated PSA   2. BPH with urinary obstruction   3. Nocturia   4. Urinary frequency       Consult requested by Colvin Caroli NP.   Kyle Wade is a 67 yo male who was found to have an elevated PSA of 8 with a 10% f/t ratio on 02/25/23.  He saw Dr. Nechama Guard for several visits prior to 2017 for frequency and nocturia. He has nocturia x 4 and daytime frequency.  He is on oxybutynin and tamsulosin.   His IPSS is 12.  He had weight loss 3 years ago but it has stabilized.  He has no bone pain.  He has chronic thrombocytopenia.  He has mitral regurgitation that is managed by cardiology.       IPSS       Row Name 05/29/23 1100                   International Prostate Symptom Score    How often have you had the sensation of not emptying your bladder? Less than 1 in 5        How often have you had to urinate less than every two hours? Almost always        How often have you found you stopped and started again several times when you urinated? Not at All        How often have you found it difficult to postpone urination? Not at All        How often have you had a weak urinary stream? Not at All        How often have you had to strain to start urination? Less than half the time        How many times did you typically get up at night to urinate? 4 Times        Total IPSS Score 12               Quality of Life due to urinary symptoms    If you were to spend the rest of your life with your urinary condition just the way it is now how would you feel about that? Mixed                      ROS:   Review of Systems  Eyes:  Positive for blurred vision.  Gastrointestinal:  Positive for diarrhea and heartburn.  Neurological:  Positive for dizziness and headaches.       Mental retardation.   Endo/Heme/Allergies:  Positive for polydipsia.      Allergies  No Known Allergies         Past Medical History:  Diagnosis Date   Acute cholecystitis 01/25/2016   Anxiety     BPH (benign  prostatic hyperplasia)     Chronic vomiting     GERD (gastroesophageal reflux disease)     Hypercholesteremia     Hypertension     Mental retardation     OSA (obstructive sleep apnea)      no cpap machine; could not tolerate.   Schatzki's ring     Schizophrenia [facility])                 Past Surgical History:  Procedure Laterality Date   BIOPSY   05/14/2017    Procedure: BIOPSY;  Surgeon: [name], MD;  [facility];  Service: Endoscopy;;  gastric polyp bx   CHOLECYSTECTOMY N/A 01/26/2016  Procedure: LAPAROSCOPIC CHOLECYSTECTOMY;  Surgeon: [name], MD;  Location: [facility];  Service: General;  Laterality: N/A;   COLONOSCOPY N/A 09/21/2014    [name]: right-sided diverticulosis, internal hemorrhoids. Repeat in 10 years.    COLONOSCOPY WITH PROPOFOL N/A 05/14/2017    [name]: diverticulosis in ascending colon, otherwise normal   ESOPHAGOGASTRODUODENOSCOPY N/A 09/21/2014    [name]: Subtle non-critical Schatzki's ring s/p 56 F dilation, query occult cervical esophageal web. Hiatal hernia. Question gastroparesis.    ESOPHAGOGASTRODUODENOSCOPY (EGD) WITH PROPOFOL N/A 05/14/2017    [name]: normal esophagus s/p empiric dilation, benign gastric polyps, normal duodenum   MALONEY DILATION N/A 09/21/2014    Procedure: Elease Hashimoto DILATION;  Surgeon: [name], MD;  [facility];  Service: Endoscopy;  Laterality: N/A;   MALONEY DILATION N/A 05/14/2017    Procedure: Elease Hashimoto DILATION;  Surgeon: [name], MD;  [facility];  Service: Endoscopy;  Laterality: N/A;   None       SAVORY DILATION N/A 09/21/2014    Procedure: SAVORY DILATION;  Surgeon: [name], MD;  [facility];  Service: Endoscopy;  Laterality: N/A;   TEE WITHOUT CARDIOVERSION N/A 02/02/2018    Procedure: TRANSESOPHAGEAL ECHOCARDIOGRAM (TEE) WITH PROPOFOL;  Surgeon: [name], MD;  [facility];  Service: Cardiovascular;  Laterality: N/A;           Social History         Socioeconomic History   Marital status: Single      Spouse name: Not on file   Number of children: Not on file   Years of education: Not on file   Highest education level: Not on file  Occupational History   Not on file  Tobacco Use   Smoking status: Every Day      Packs/day: 0.25      Years: 30.00      Additional pack years: 0.00      Total pack years: 7.50      Types: Cigarettes   Smokeless tobacco: Never   Tobacco comments:      4 cigarettes a day  Vaping Use   Vaping Use: Never used  Substance and Sexual Activity   Alcohol use: No   Drug use: No   Sexual activity: Never      Birth control/protection: None  Other Topics Concern   Not on file  Social History Narrative    Lives in Group Home.     Smokes cigarettes qd.    Able to smoke cigarettes at 8, 12, 4, 8pm.     Seen by [name] in [facility].     [facility] -[facility].         Legal guardian: Armed forces logistics/support/administrative officer Department of Social services.     Social Determinants of Health    Financial Resource Strain: Not on file  Food Insecurity: Not on file  Transportation Needs: Not on file  Physical Activity: Not on file  Stress: Not on file  Social Connections: Not on file  Intimate Partner Violence: Not on file           Family History  Problem Relation Age of Onset   Colon cancer Other          unknown   Hypertension Other     Hyperlipidemia Other            Anti-infectives: Anti-infectives (From admission, onward)        None  Current Outpatient Medications  Medication Sig Dispense Refill   amLODipine (NORVASC) 5 MG tablet TAKE (1) TABLET BY MOUTH ONCE DAILY. 30 tablet 6   buPROPion (WELLBUTRIN SR) 150 MG 12 hr tablet Take 150 mg by mouth 2 (two) times daily.       DEXILANT 60 MG capsule TAKE 1 CAPSULE BY MOUTH ONCE A DAY. 30 capsule 11   docusate sodium (COLACE) 100 MG capsule TAKE (1) CAPSULE BY MOUTH TWICE DAILY FOR CONSTIPATION. 60  capsule 3   ENSURE (ENSURE) USE 1 CAN BY MOUTH TWICE DAILY 2370 mL PRN   famotidine (PEPCID) 40 MG tablet Take 40 mg by mouth at bedtime.       fluticasone (FLONASE) 50 MCG/ACT nasal spray Place 2 sprays into both nostrils daily.       mirtazapine (REMERON) 15 MG tablet TAKE 1 TABLET BY MOUTH DAILY 1 HOUR BEFORE BEDTIME. (Patient taking differently: Take 7.5 mg by mouth at bedtime.) 30 tablet 11   oxybutynin (DITROPAN) 5 MG tablet TAKE 1 TABLET BY MOUTH EVERY MORNING. 30 tablet 11   potassium chloride (MICRO-K) 10 MEQ CR capsule Take 10 mEq by mouth 2 (two) times daily.       psyllium (METAMUCIL) 58.6 % powder Take 1 packet by mouth daily.       risperiDONE (RISPERDAL) 0.5 MG tablet Take 0.5 mg by mouth at bedtime.       tamsulosin (FLOMAX) 0.4 MG CAPS capsule TAKE 1 CAPSULE BY MOUTH ONCE DAILY 30 MINUTES AFTER THE SAME MEAL EACH DAY. 30 capsule 11   triamterene-hydrochlorothiazide (MAXZIDE-25) 37.5-25 MG tablet TAKE (1) TABLET BY MOUTH DAILY IN THE MORNING FOR HIGH BLOOD PRESSURE. 30 tablet 11   Vitamin D, Ergocalciferol, (DRISDOL) 50000 units CAPS capsule TAKE 1 CAPSULE BY MOUTH ONCE A MONTH ON THE 1ST. DO NOT CRUSH. 1 capsule 11   ammonium lactate (AMLACTIN) 12 % cream Apply 1 Application topically 2 (two) times daily. (Patient not taking: Reported on 05/29/2023)          No current facility-administered medications for this visit.          Objective: Vital signs in last 24 hours: BP 139/80   Pulse 62   Ht 5\' 7"  (1.702 m)   Wt 137 lb 11.2 oz (62.5 kg)   BMI 21.57 kg/m    Intake/Output from previous day: No intake/output data recorded. Intake/Output this shift: @IOTHISSHIFT @     Physical Exam Constitutional:      Comments: Low set ears.    Cardiovascular:     Rate and Rhythm: Normal rate and regular rhythm.     Heart sounds: Murmur heard.  Pulmonary:     Effort: Pulmonary effort is normal. No respiratory distress.     Breath sounds: Normal breath sounds.  Abdominal:      General: Abdomen is flat.     Palpations: Abdomen is soft.     Hernia: No hernia is present.  Genitourinary:    Comments: Uncirc phallus with adequate meatus. Scrotum, testes and epididymis normal. AP/NST with mass. Prostate 1.5+ benign. SV non-palpable.  Musculoskeletal:        General: No swelling. Normal range of motion.     Cervical back: Normal range of motion and neck supple.  Skin:    General: Skin is warm and dry.  Neurological:     General: No focal deficit present.     Mental Status: He is alert.     Comments: MR  Lab Results:  Lab Results Last 24 Hours       Results for orders placed or performed in visit on 05/29/23 (from the past 24 hour(s))  Urinalysis, Routine w reflex microscopic     Status: None    Collection Time: 05/29/23 11:17 AM  Result Value Ref Range    Specific Gravity, UA 1.010 1.005 - 1.030    pH, UA 7.5 5.0 - 7.5    Color, UA Yellow Yellow    Appearance Ur Clear Clear    Leukocytes,UA Negative Negative    Protein,UA Negative Negative/Trace    Glucose, UA Negative Negative    Ketones, UA Negative Negative    RBC, UA Negative Negative    Bilirubin, UA Negative Negative    Urobilinogen, Ur 0.2 0.2 - 1.0 mg/dL    Nitrite, UA Negative Negative    Microscopic Examination Comment      Narrative    Performed at:  [address] [facility] [address] [place], [place]  [id number] Lab Director: Chinita Pester MT, Phone:  [phone]      BMET Recent Labs (last 2 labs)  No results for input(s): "NA", "K", "CL", "CO2", "GLUCOSE", "BUN", "CREATININE", "CALCIUM" in the last 72 hours.   PT/INR Recent Labs (last 2 labs)  No results for input(s): "LABPROT", "INR" in the last 72 hours.   ABG  Recent Labs (last 2 labs)  No results for input(s): "PHART", "HCO3" in the last 72 hours.   Invalid input(s): "PCO2", "PO2"   PSA reviewed.   UA is clear.  Studies/Results: Imaging Results (Last 48 hours)  No results found.   PCP records  reviewed.  Assessment/Plan: The risks/benefits/alternatives to prostate biopsy was explained to the patient and he understands and wishes to proceed with surgery.

## 2023-08-07 NOTE — Op Note (Signed)
Pre op diagnosis: Elevated PSA  Post op diagnosis: Elevated PSA        Procedure: Transrectal ultrasound of the prostate, Ultrasound Guided Prostate needle biopsy.   Attending: Wilkie Aye  Anesthesia: General  EBL: minimal  Antibiotics: Gentamicin  Drains: none  Findings: 35.9g prostate with prominent right lobe. Prostate measurements: width 4.3cm, height 5.1cm, length 4.4cm no hypoechoic or hyperechoic lesions   Indications: Pt is a 67yo male with a history of elevated PSA and abnormal DRE. After discussing workup he has elected to proceed with prostate biopsy  Procedure in detail: Prior to procedure consent was obtained. The patient was brought to the OR and a brief timeout was done to ensure correct patient, correct procedure, and correct site. General anesthesia was administered and patient was placed in the dorsal lithotomy position. The prostate size was estimated to be 40g by digital rectal exam. The 10 MHz transrectal ultrasound probe was placed into the rectum. Prostate measured width 4.3cm, height 5.1cm, length 4.4cm . Prostate volume was measured to be 35.9 cc. The biopsy cores were obtained using direct, real-time ultrasound guidance utilizing a standard 14core pattern with one core from the right apex lateral using real time ultrasound guidance to direct the biopsy core being taken from this location, right apex medial using real time ultrasound guidance to direct the biopsy core being taken from this location, left apex lateral using real time ultrasound guidance to direct the biopsy core being taken from this location, left apex medial using real time ultrasound guidance to direct the biopsy core being taken from this location, 2 right mid lateral using real time ultrasound guidance to direct the biopsy core being taken from this location, 2 right mid medial using real time ultrasound guidance to direct the biopsy core being taken from this location, left mid lateral using  real time ultrasound guidance to direct the biopsy core being taken from this location, left mid medial using real time ultrasound guidance to direct the biopsy core being taken from this location, right base lateral using real time ultrasound guidance to direct the biopsy core being taken from this location, right base medial using real time ultrasound guidance to direct the biopsy core being taken from this location, left base lateral using real time ultrasound guidance to direct the biopsy core being taken from this location and left base medial of the prostate using real time ultrasound guidance to direct the biopsy core being taken from this location. The biopsy cores were placed in buffered formalin and sent to pathology. This then concluded the procedure which was well tolerated by the patient.   Complications:. None.   Condition: Stable, extubated, transferred to PACU  Plan: Patient is to be discharged home. They are to followup in 1 week for pathology discussion

## 2023-08-07 NOTE — Transfer of Care (Signed)
Immediate Anesthesia Transfer of Care Note  Patient: Kyle Wade  Procedure(s) Performed: PROSTATE BIOPSY (Prostate) HIGH INTENSITY FOCUSED ULTRASOUND (HIFU) OF THE PROSTATE (Prostate)  Patient Location: PACU  Anesthesia Type:General  Level of Consciousness: drowsy  Airway & Oxygen Therapy: Patient Spontanous Breathing and Patient connected to face mask oxygen  Post-op Assessment: Report given to RN and Post -op Vital signs reviewed and stable  Post vital signs: Reviewed and stable  Last Vitals:  Vitals Value Taken Time  BP    Temp    Pulse 75 08/07/23 1203  Resp 14 08/07/23 1203  SpO2 100 % 08/07/23 1203  Vitals shown include unfiled device data.  Last Pain:  Vitals:   08/07/23 1018  TempSrc: Oral         Complications: No notable events documented.

## 2023-08-08 ENCOUNTER — Encounter (HOSPITAL_COMMUNITY): Payer: Self-pay | Admitting: Urology

## 2023-08-09 NOTE — Anesthesia Postprocedure Evaluation (Signed)
Anesthesia Post Note  Patient: Kyle Wade  Procedure(s) Performed: PROSTATE BIOPSY (Prostate) HIGH INTENSITY FOCUSED ULTRASOUND (HIFU) OF THE PROSTATE (Prostate)  Patient location during evaluation: Phase II Anesthesia Type: General Level of consciousness: awake Pain management: pain level controlled Vital Signs Assessment: post-procedure vital signs reviewed and stable Respiratory status: spontaneous breathing and respiratory function stable Cardiovascular status: blood pressure returned to baseline and stable Postop Assessment: no headache and no apparent nausea or vomiting Anesthetic complications: no Comments: Late entry   No notable events documented.   Last Vitals:  Vitals:   08/07/23 1230 08/07/23 1239  BP: 129/89 136/86  Pulse: 82 78  Resp: 13 16  Temp:    SpO2: 100% 98%    Last Pain:  Vitals:   08/07/23 1239  TempSrc:   PainSc: 0-No pain                 Windell Norfolk

## 2023-08-19 ENCOUNTER — Telehealth: Payer: Self-pay

## 2023-08-19 NOTE — Telephone Encounter (Signed)
-----   Message from Wilkie Aye sent at 08/19/2023  9:30 AM EDT ----- Biopsy showed atypia and he should followup in 3 months with PSA ----- Message ----- From: Interface, Lab In [facility] Sent: 08/12/2023  10:14 AM EDT To: Malen Gauze, MD

## 2023-08-21 ENCOUNTER — Ambulatory Visit (INDEPENDENT_AMBULATORY_CARE_PROVIDER_SITE_OTHER): Payer: 59 | Admitting: Urology

## 2023-08-21 VITALS — BP 125/84 | Temp 79.0°F | Ht 67.0 in | Wt 137.8 lb

## 2023-08-21 DIAGNOSIS — R972 Elevated prostate specific antigen [PSA]: Secondary | ICD-10-CM | POA: Diagnosis not present

## 2023-08-21 DIAGNOSIS — N401 Enlarged prostate with lower urinary tract symptoms: Secondary | ICD-10-CM

## 2023-08-21 DIAGNOSIS — R351 Nocturia: Secondary | ICD-10-CM | POA: Diagnosis not present

## 2023-08-21 DIAGNOSIS — N4232 Atypical small acinar proliferation of prostate: Secondary | ICD-10-CM

## 2023-08-21 DIAGNOSIS — R350 Frequency of micturition: Secondary | ICD-10-CM

## 2023-08-21 DIAGNOSIS — N138 Other obstructive and reflux uropathy: Secondary | ICD-10-CM

## 2023-08-21 NOTE — Progress Notes (Signed)
Subjective: 1. Elevated PSA   2. BPH with urinary obstruction   3. Nocturia   4. Urinary frequency   5. Atypical small acinar proliferation of prostate       Consult requested by Colvin Caroli NP.  08/21/23: Kyle Wade returns today in fu from his recent prostate biopsy.   His prostate was 35.42ml.  He had 2 cores with HGPIN and 1 core with atypia at the right apex.  He remains on oxybutynin and tamsulosin and is voiding ok.    Gu Hx: Kyle Wade is a 67 yo male who was found to have an elevated PSA of 8 with a 10% f/t ratio on 02/25/23.  He saw Dr. Nechama Guard for several visits prior to 2017 for frequency and nocturia. He has nocturia x 4 and daytime frequency.  He is on oxybutynin and tamsulosin.   His IPSS is 12.  He had weight loss 3 years ago but it has stabilized.  He has no bone pain.  He has chronic thrombocytopenia.  He has mitral regurgitation that is managed by cardiology.     IPSS     Row Name 08/21/23 1500         International Prostate Symptom Score   How often have you had the sensation of not emptying your bladder? Less than half the time     How often have you had to urinate less than every two hours? Less than half the time     How often have you found you stopped and started again several times when you urinated? Less than 1 in 5 times     How often have you found it difficult to postpone urination? Less than 1 in 5 times     How often have you had a weak urinary stream? Less than 1 in 5 times     How often have you had to strain to start urination? About half the time     How many times did you typically get up at night to urinate? 2 Times     Total IPSS Score 12       Quality of Life due to urinary symptoms   If you were to spend the rest of your life with your urinary condition just the way it is now how would you feel about that? Mostly Satisfied               ROS:  Review of Systems  Neurological:        Mental retardation.   All other systems reviewed and are  negative.   No Known Allergies  Past Medical History:  Diagnosis Date   Acute cholecystitis 01/25/2016   Anxiety    BPH (benign prostatic hyperplasia)    Chronic vomiting    GERD (gastroesophageal reflux disease)    Hypercholesteremia    Hypertension    Mental retardation    OSA (obstructive sleep apnea)    no cpap machine; could not tolerate.   Schatzki's ring    Schizophrenia [facility])     Past Surgical History:  Procedure Laterality Date   BIOPSY  05/14/2017   Procedure: BIOPSY;  Surgeon: [name], MD;  [facility];  Service: Endoscopy;;  gastric polyp bx   CHOLECYSTECTOMY N/A 01/26/2016   Procedure: LAPAROSCOPIC CHOLECYSTECTOMY;  Surgeon: [name], MD;  Location: [facility];  Service: General;  Laterality: N/A;   COLONOSCOPY N/A 09/21/2014   [name]: right-sided diverticulosis, internal hemorrhoids. Repeat in 10 years.    COLONOSCOPY  WITH PROPOFOL N/A 05/14/2017   [name]: diverticulosis in ascending colon, otherwise normal   ESOPHAGOGASTRODUODENOSCOPY N/A 09/21/2014   [name]: Subtle non-critical Schatzki's ring s/p 56 F dilation, query occult cervical esophageal web. Hiatal hernia. Question gastroparesis.    ESOPHAGOGASTRODUODENOSCOPY (EGD) WITH PROPOFOL N/A 05/14/2017   [name]: normal esophagus s/p empiric dilation, benign gastric polyps, normal duodenum   HIGH INTENSITY FOCUSED ULTRASOUND (HIFU) OF THE PROSTATE N/A 08/07/2023   Procedure: HIGH INTENSITY FOCUSED ULTRASOUND (HIFU) OF THE PROSTATE;  Surgeon: Malen Gauze, MD;  Location: [facility];  Service: Urology;  Laterality: N/A;   MALONEY DILATION N/A 09/21/2014   Procedure: Elease Hashimoto DILATION;  Surgeon: [name], MD;  [facility];  Service: Endoscopy;  Laterality: N/A;   MALONEY DILATION N/A 05/14/2017   Procedure: Elease Hashimoto DILATION;  Surgeon: [name], MD;  [facility];  Service: Endoscopy;  Laterality: N/A;   None     PROSTATE BIOPSY N/A 08/07/2023    Procedure: PROSTATE BIOPSY;  Surgeon: Malen Gauze, MD;  Location: [facility];  Service: Urology;  Laterality: N/A;   SAVORY DILATION N/A 09/21/2014   Procedure: SAVORY DILATION;  Surgeon: [name], MD;  [facility];  Service: Endoscopy;  Laterality: N/A;   TEE WITHOUT CARDIOVERSION N/A 02/02/2018   Procedure: TRANSESOPHAGEAL ECHOCARDIOGRAM (TEE) WITH PROPOFOL;  Surgeon: [name], MD;  [facility];  Service: Cardiovascular;  Laterality: N/A;    Social History   Socioeconomic History   Marital status: Single    Spouse name: Not on file   Number of children: Not on file   Years of education: Not on file   Highest education level: Not on file  Occupational History   Not on file  Tobacco Use   Smoking status: Every Day    Current packs/day: 0.25    Average packs/day: 0.3 packs/day for 30.0 years (7.5 ttl pk-yrs)    Types: Cigarettes   Smokeless tobacco: Never   Tobacco comments:    4 cigarettes a day  Vaping Use   Vaping status: Never Used  Substance and Sexual Activity   Alcohol use: No   Drug use: No   Sexual activity: Never    Birth control/protection: None  Other Topics Concern   Not on file  Social History Narrative   Lives in Group Home.    Smokes cigarettes qd.   Able to smoke cigarettes at 8, 12, 4, 8pm.    Seen by [name] in [facility].    [facility] -[facility].      Legal guardian: Armed forces logistics/support/administrative officer Department of Social services.    Social Determinants of Health   Financial Resource Strain: Not on file  Food Insecurity: Not on file  Transportation Needs: Not on file  Physical Activity: Not on file  Stress: Not on file  Social Connections: Not on file  Intimate Partner Violence: Not on file    Family History  Problem Relation Age of Onset   Colon cancer Other        unknown   Hypertension Other    Hyperlipidemia Other     Anti-infectives: Anti-infectives (From admission, onward)    None        Current Outpatient Medications  Medication Sig Dispense Refill   amLODipine (NORVASC) 5 MG tablet TAKE (1) TABLET BY MOUTH ONCE DAILY. 30 tablet 6   ammonium lactate (AMLACTIN) 12 % cream Apply 1 Application topically 2 (two) times daily.  buPROPion (WELLBUTRIN SR) 150 MG 12 hr tablet Take 150 mg by mouth 2 (two) times daily.     DEXILANT 60 MG capsule TAKE 1 CAPSULE BY MOUTH ONCE A DAY. 30 capsule 11   docusate sodium (COLACE) 100 MG capsule TAKE (1) CAPSULE BY MOUTH TWICE DAILY FOR CONSTIPATION. 60 capsule 3   ENSURE (ENSURE) USE 1 CAN BY MOUTH TWICE DAILY 2370 mL PRN   famotidine (PEPCID) 40 MG tablet Take 40 mg by mouth at bedtime.     fluticasone (FLONASE) 50 MCG/ACT nasal spray Place 2 sprays into both nostrils daily.     mirtazapine (REMERON) 15 MG tablet TAKE 1 TABLET BY MOUTH DAILY 1 HOUR BEFORE BEDTIME. (Patient taking differently: Take 7.5 mg by mouth at bedtime.) 30 tablet 11   oxybutynin (DITROPAN) 5 MG tablet TAKE 1 TABLET BY MOUTH EVERY MORNING. 30 tablet 11   potassium chloride (MICRO-K) 10 MEQ CR capsule Take 10 mEq by mouth 2 (two) times daily.     psyllium (METAMUCIL) 58.6 % powder Take 1 packet by mouth daily. 8 oz of water     risperiDONE (RISPERDAL) 1 MG tablet Take 1 mg by mouth 3 (three) times daily.     tamsulosin (FLOMAX) 0.4 MG CAPS capsule TAKE 1 CAPSULE BY MOUTH ONCE DAILY 30 MINUTES AFTER THE SAME MEAL EACH DAY. 30 capsule 11   triamterene-hydrochlorothiazide (MAXZIDE-25) 37.5-25 MG tablet TAKE (1) TABLET BY MOUTH DAILY IN THE MORNING FOR HIGH BLOOD PRESSURE. 30 tablet 11   Vitamin D, Ergocalciferol, (DRISDOL) 50000 units CAPS capsule TAKE 1 CAPSULE BY MOUTH ONCE A MONTH ON THE 1ST. DO NOT CRUSH. 1 capsule 11   No current facility-administered medications for this visit.     Objective: Vital signs in last 24 hours: BP 125/84   Temp (!) 79 F (26.1 C)   Ht 5\' 7"  (1.702 m)   Wt 137 lb 12.8 oz (62.5 kg)   BMI 21.58 kg/m   Intake/Output from  previous day: No intake/output data recorded. Intake/Output this shift: @IOTHISSHIFT @   Physical Exam Vitals reviewed.  Constitutional:      Appearance: Normal appearance.  Neurological:     Mental Status: He is alert.     Lab Results:  No results found for this or any previous visit (from the past 24 hour(s)).   BMET No results for input(s): "NA", "K", "CL", "CO2", "GLUCOSE", "BUN", "CREATININE", "CALCIUM" in the last 72 hours. PT/INR No results for input(s): "LABPROT", "INR" in the last 72 hours. ABG No results for input(s): "PHART", "HCO3" in the last 72 hours.  Invalid input(s): "PCO2", "PO2" PATH Report reviewed.   Studies/Results:  Assessment/Plan: Elevated PSA.    He had a single core of atypia and 2 of HGPIN.  I will just have him return in 25mo with a PSA.   Frequency and nocturia with BPH.  This is chronic and he will continue current therapy.    Thrombocytopenia.  His platelet count remains >100K.  No orders of the defined types were placed in this encounter.    Orders Placed This Encounter  Procedures   PSA, total and free    Standing Status:   Future    Standing Expiration Date:   08/20/2024     Return in about 6 months (around 02/18/2024).    CC: Elsa Bucio NP.      Kyle Wade 08/22/2023

## 2023-08-21 NOTE — Telephone Encounter (Signed)
Please see Dr. Dimas Millin response. Patient scheduled to see you today.

## 2023-08-22 ENCOUNTER — Encounter: Payer: Self-pay | Admitting: Urology

## 2023-08-29 ENCOUNTER — Encounter: Payer: Self-pay | Admitting: Cardiology

## 2023-08-29 ENCOUNTER — Ambulatory Visit: Payer: 59 | Attending: Cardiology | Admitting: Cardiology

## 2023-08-29 VITALS — BP 138/78 | HR 84 | Ht 67.0 in | Wt 145.0 lb

## 2023-08-29 DIAGNOSIS — I10 Essential (primary) hypertension: Secondary | ICD-10-CM | POA: Diagnosis not present

## 2023-08-29 DIAGNOSIS — E782 Mixed hyperlipidemia: Secondary | ICD-10-CM | POA: Diagnosis not present

## 2023-08-29 DIAGNOSIS — I340 Nonrheumatic mitral (valve) insufficiency: Secondary | ICD-10-CM

## 2023-08-29 MED ORDER — ATORVASTATIN CALCIUM 20 MG PO TABS
20.0000 mg | ORAL_TABLET | Freq: Every day | ORAL | 3 refills | Status: AC
Start: 2023-08-29 — End: 2024-07-15

## 2023-08-29 MED ORDER — AMLODIPINE BESYLATE 10 MG PO TABS: 10.0000 mg | ORAL_TABLET | Freq: Every day | ORAL | 3 refills | Status: AC

## 2023-08-29 NOTE — Patient Instructions (Signed)
Medication Instructions:  Your physician has recommended you make the following change in your medication:   -Increase Amlodipine to 10 mg tablet once daily. -Start Atorvastatin to 20 mg tablet once daily.   *If you need a refill on your cardiac medications before your next appointment, please call your pharmacy*   Lab Work: None If you have labs (blood work) drawn today and your tests are completely normal, you will receive your results only by: MyChart Message (if you have MyChart) OR A paper copy in the mail If you have any lab test that is abnormal or we need to change your treatment, we will call you to review the results.   Testing/Procedures: None   Follow-Up: At [facility], you and your health needs are our priority.  As part of our continuing mission to provide you with exceptional heart care, we have created designated Provider Care Teams.  These Care Teams include your primary Cardiologist (physician) and Advanced Practice Providers (APPs -  Physician Assistants and Nurse Practitioners) who all work together to provide you with the care you need, when you need it.  We recommend signing up for the patient portal called "MyChart".  Sign up information is provided on this After Visit Summary.  MyChart is used to connect with patients for Virtual Visits (Telemedicine).  Patients are able to view lab/test results, encounter notes, upcoming appointments, etc.  Non-urgent messages can be sent to your provider as well.   To learn more about what you can do with MyChart, go to [email or web address].    Your next appointment:   6 month(s)  Provider:   You may see Dina Rich, MD or one of the following Advanced Practice Providers on your designated Care Team:   Randall An, PA-C  Jacolyn Reedy, [place]     Other Instructions

## 2023-08-29 NOTE — Progress Notes (Signed)
Clinical Summary Kyle Wade is a 67 y.o.male seen today for follow up of the following medical problems.    1. MItral regurgitation/heart murmur - newly diagnosed heart murmur detected by pcp - echo Jan 2019 LVEF 60-65%, no WMAs, normal diastolic function, moderate to severe MR. LVIDs 19. The MV VTI/AV VTI of 1.5 would actually suggest severe   MR    - 01/2018 TEE that indicated moderate MR.  - 01/2019 TTE: moderate MR   - no recent edema. No SOB/DOE. Does daily chores without troubles.    09/2020 echo: LVEF 60-65%, moderate MR, LVIDs 2.5    07/2022 echo: LVEF 70-75%, reported LVIDs 1.13, no WMAs, grade I dd, mod MR  - no recent SOB/DOE, no LE edema.    2. HTN - he is compliant with meds   3. Cognitive deficit/Mental handicap - has legal guardian   4. Schizophrenia - followed by pcp       08/2022 TC 186 TG 79 HDL 73 LDL 97 ASCVD 10 year risk score is 32%   SH: Kyle Wade with [facility] DSS is his guardian Past Medical History:  Diagnosis Date   Acute cholecystitis 01/25/2016   Anxiety    BPH (benign prostatic hyperplasia)    Chronic vomiting    GERD (gastroesophageal reflux disease)    Hypercholesteremia    Hypertension    Mental retardation    OSA (obstructive sleep apnea)    no cpap machine; could not tolerate.   Schatzki's ring    Schizophrenia (HCC)      No Known Allergies   Current Outpatient Medications  Medication Sig Dispense Refill   amLODipine (NORVASC) 5 MG tablet TAKE (1) TABLET BY MOUTH ONCE DAILY. 30 tablet 6   ammonium lactate (AMLACTIN) 12 % cream Apply 1 Application topically 2 (two) times daily.     buPROPion (WELLBUTRIN SR) 150 MG 12 hr tablet Take 150 mg by mouth 2 (two) times daily.     DEXILANT 60 MG capsule TAKE 1 CAPSULE BY MOUTH ONCE A DAY. 30 capsule 11   docusate sodium (COLACE) 100 MG capsule TAKE (1) CAPSULE BY MOUTH TWICE DAILY FOR CONSTIPATION. 60 capsule 3   ENSURE (ENSURE) USE 1 CAN BY MOUTH TWICE DAILY 2370 mL PRN    famotidine (PEPCID) 40 MG tablet Take 40 mg by mouth at bedtime.     fluticasone (FLONASE) 50 MCG/ACT nasal spray Place 2 sprays into both nostrils daily.     mirtazapine (REMERON) 15 MG tablet TAKE 1 TABLET BY MOUTH DAILY 1 HOUR BEFORE BEDTIME. (Patient taking differently: Take 7.5 mg by mouth at bedtime.) 30 tablet 11   oxybutynin (DITROPAN) 5 MG tablet TAKE 1 TABLET BY MOUTH EVERY MORNING. 30 tablet 11   potassium chloride (MICRO-K) 10 MEQ CR capsule Take 10 mEq by mouth 2 (two) times daily.     psyllium (METAMUCIL) 58.6 % powder Take 1 packet by mouth daily. 8 oz of water     risperiDONE (RISPERDAL) 1 MG tablet Take 1 mg by mouth 3 (three) times daily.     tamsulosin (FLOMAX) 0.4 MG CAPS capsule TAKE 1 CAPSULE BY MOUTH ONCE DAILY 30 MINUTES AFTER THE SAME MEAL EACH DAY. 30 capsule 11   triamterene-hydrochlorothiazide (MAXZIDE-25) 37.5-25 MG tablet TAKE (1) TABLET BY MOUTH DAILY IN THE MORNING FOR HIGH BLOOD PRESSURE. 30 tablet 11   Vitamin D, Ergocalciferol, (DRISDOL) 50000 units CAPS capsule TAKE 1 CAPSULE BY MOUTH ONCE A MONTH ON THE 1ST. DO NOT  CRUSH. 1 capsule 11   No current facility-administered medications for this visit.     Past Surgical History:  Procedure Laterality Date   BIOPSY  05/14/2017   Procedure: BIOPSY;  Surgeon: [name], MD;  [facility];  Service: Endoscopy;;  gastric polyp bx   CHOLECYSTECTOMY N/A 01/26/2016   Procedure: LAPAROSCOPIC CHOLECYSTECTOMY;  Surgeon: [name], MD;  Location: [facility];  Service: General;  Laterality: N/A;   COLONOSCOPY N/A 09/21/2014   [name]: right-sided diverticulosis, internal hemorrhoids. Repeat in 10 years.    COLONOSCOPY WITH PROPOFOL N/A 05/14/2017   [name]: diverticulosis in ascending colon, otherwise normal   ESOPHAGOGASTRODUODENOSCOPY N/A 09/21/2014   [name]: Subtle non-critical Schatzki's ring s/p 56 F dilation, query occult cervical esophageal web. Hiatal hernia. Question gastroparesis.     ESOPHAGOGASTRODUODENOSCOPY (EGD) WITH PROPOFOL N/A 05/14/2017   [name]: normal esophagus s/p empiric dilation, benign gastric polyps, normal duodenum   HIGH INTENSITY FOCUSED ULTRASOUND (HIFU) OF THE PROSTATE N/A 08/07/2023   Procedure: HIGH INTENSITY FOCUSED ULTRASOUND (HIFU) OF THE PROSTATE;  Surgeon: Malen Gauze, MD;  Location: [facility];  Service: Urology;  Laterality: N/A;   MALONEY DILATION N/A 09/21/2014   Procedure: Elease Hashimoto DILATION;  Surgeon: [name], MD;  [facility];  Service: Endoscopy;  Laterality: N/A;   MALONEY DILATION N/A 05/14/2017   Procedure: Elease Hashimoto DILATION;  Surgeon: [name], MD;  [facility];  Service: Endoscopy;  Laterality: N/A;   None     PROSTATE BIOPSY N/A 08/07/2023   Procedure: PROSTATE BIOPSY;  Surgeon: Malen Gauze, MD;  Location: [facility];  Service: Urology;  Laterality: N/A;   SAVORY DILATION N/A 09/21/2014   Procedure: SAVORY DILATION;  Surgeon: [name], MD;  [facility];  Service: Endoscopy;  Laterality: N/A;   TEE WITHOUT CARDIOVERSION N/A 02/02/2018   Procedure: TRANSESOPHAGEAL ECHOCARDIOGRAM (TEE) WITH PROPOFOL;  Surgeon: [name], MD;  [facility];  Service: Cardiovascular;  Laterality: N/A;     No Known Allergies    Family History  Problem Relation Age of Onset   Colon cancer Other        unknown   Hypertension Other    Hyperlipidemia Other      Social History Mr. Segrest reports that he has been smoking cigarettes. He has a 7.5 pack-year smoking history. He has never used smokeless tobacco. Mr. Peaslee reports no history of alcohol use.   Review of Systems CONSTITUTIONAL: No weight loss, fever, chills, weakness or fatigue.  HEENT: Eyes: No visual loss, blurred vision, double vision or yellow sclerae.No hearing loss, sneezing, congestion, runny nose or sore throat.  SKIN: No rash or itching.  CARDIOVASCULAR: per hpi RESPIRATORY: No shortness of breath, cough or  sputum.  GASTROINTESTINAL: No anorexia, nausea, vomiting or diarrhea. No abdominal pain or blood.  GENITOURINARY: No burning on urination, no polyuria NEUROLOGICAL: No headache, dizziness, syncope, paralysis, ataxia, numbness or tingling in the extremities. No change in bowel or bladder control.  MUSCULOSKELETAL: No muscle, back pain, joint pain or stiffness.  LYMPHATICS: No enlarged nodes. No history of splenectomy.  PSYCHIATRIC: No history of depression or anxiety.  ENDOCRINOLOGIC: No reports of sweating, cold or heat intolerance. No polyuria or polydipsia.  [name]   Physical Examination Today's Vitals   08/29/23 1530  BP: (!) 150/80  Pulse: 84  SpO2: 96%  Weight: 145 lb (65.8 kg)  Height: 5\' 7"  (1.702 m)   Body mass  index is 22.71 kg/m.  Gen: resting comfortably, no acute distress HEENT: no scleral icterus, pupils equal round and reactive, no palptable cervical adenopathy,  CV: RRR, 3/6 systolic murmur apex Resp: Clear to auscultation bilaterally GI: abdomen is soft, non-tender, non-distended, normal bowel sounds, no hepatosplenomegaly MSK: extremities are warm, no edema.  Skin: warm, no rash Neuro:  no focal deficits Psych: appropriate affect   Diagnostic Studies  01/2019 TTE   IMPRESSIONS      1. The left ventricle has normal systolic function with an ejection fraction of 60-65%. The cavity size was normal. There is mild concentric left ventricular hypertrophy. Left ventricular diastolic parameters were normal No evidence of left ventricular regional wall motion abnormalities.  2. The right ventricle has normal systolic function. The cavity was normal. There is no increase in right ventricular wall thickness.  3. The mitral valve is myxomatous. Mild thickening of the mitral valve leaflet. Mitral valve regurgitation is moderate by color flow Doppler. Severity may be underestimated due to jet eccentricity.  4. The tricuspid valve is normal in structure.  5. The aortic valve  is tricuspid.  6. The pulmonic valve was grossly normal. Pulmonic valve regurgitation is mild by color flow Doppler.  7. The aortic root is normal in size and structure.  8. No evidence of left ventricular regional wall motion abnormalities.     09/2020 echo IMPRESSIONS     1. Left ventricular ejection fraction, by estimation, is 60 to 65%. The  left ventricle has normal function. The left ventricle has no regional  wall motion abnormalities. There is moderate left ventricular hypertrophy.  Left ventricular diastolic  parameters were normal.   2. Right ventricular systolic function is normal. The right ventricular  size is normal. There is normal pulmonary artery systolic pressure.   3. Left atrial size was mildly dilated.   4. 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 The eccentric jet is difficult to  quantify. The MV/AV VTI ratio is 1.4 suggesting moderate MR. . The mitral  valve is abnormal. Moderate mitral valve  regurgitation. No evidence of mitral stenosis.   5. The aortic valve has an indeterminant number of cusps. There is mild  calcification of the aortic valve. There is mild thickening of the aortic  valve. Aortic valve regurgitation is not visualized. No aortic stenosis is  present.   6. The inferior vena cava is normal in size with greater than 50%  respiratory variability, suggesting right atrial pressure of 3 mmHg.       07/2022 echo IMPRESSIONS     1. Left ventricular ejection fraction, by estimation, is 70 to 75%. The  left ventricle has hyperdynamic function. The left ventricle has no  regional wall motion abnormalities. There is mild left ventricular  hypertrophy. Left ventricular diastolic  parameters are consistent with Grade I diastolic dysfunction (impaired  relaxation). The average left ventricular global longitudinal strain is  -24.0 %. The global longitudinal strain is normal.   2. Right  ventricular systolic function is normal. The right ventricular  size is normal. There is normal pulmonary artery systolic pressure. The  estimated right ventricular systolic pressure is 18.2 mmHg.   3. Left atrial size was mildly dilated.   4. The mitral valve is myxomatous. There is moderate prolapse of the  posterior leaflet and partially anterior leaflet. At least moderate mitral  valve regurgitation, eccentric.   5. The aortic valve is tricuspid. Aortic valve regurgitation is not  visualized.   6. The inferior vena cava is normal in size with greater than 50%  respiratory variability, suggesting right atrial pressure of 3 mmHg.    Assessment and Plan   1. Mitral regurgitation - remains moderate by recent echo - no symptoms - repeat echo 07/2024   2. HTN -above goal, increase norvasc to 10mg  daily  3. HLD - LDL 97 however his ASCVD risk is high, 32% risk of event in next 10 years - start atorvastatin 20mg  daily initially, can titrate over time if tolerated   F/u 6 months    Antoine Poche, M.D.

## 2023-09-08 DIAGNOSIS — L110 Acquired keratosis follicularis: Secondary | ICD-10-CM | POA: Diagnosis not present

## 2023-09-08 DIAGNOSIS — B351 Tinea unguium: Secondary | ICD-10-CM | POA: Diagnosis not present

## 2023-09-08 DIAGNOSIS — I739 Peripheral vascular disease, unspecified: Secondary | ICD-10-CM | POA: Diagnosis not present

## 2023-09-24 DIAGNOSIS — U071 COVID-19: Secondary | ICD-10-CM | POA: Diagnosis not present

## 2023-09-24 DIAGNOSIS — J069 Acute upper respiratory infection, unspecified: Secondary | ICD-10-CM | POA: Diagnosis not present

## 2023-11-19 DIAGNOSIS — K219 Gastro-esophageal reflux disease without esophagitis: Secondary | ICD-10-CM | POA: Diagnosis not present

## 2023-11-19 DIAGNOSIS — I340 Nonrheumatic mitral (valve) insufficiency: Secondary | ICD-10-CM | POA: Diagnosis not present

## 2023-11-19 DIAGNOSIS — F17200 Nicotine dependence, unspecified, uncomplicated: Secondary | ICD-10-CM | POA: Diagnosis not present

## 2023-11-19 DIAGNOSIS — I10 Essential (primary) hypertension: Secondary | ICD-10-CM | POA: Diagnosis not present

## 2023-12-03 DIAGNOSIS — I739 Peripheral vascular disease, unspecified: Secondary | ICD-10-CM | POA: Diagnosis not present

## 2023-12-03 DIAGNOSIS — L110 Acquired keratosis follicularis: Secondary | ICD-10-CM | POA: Diagnosis not present

## 2023-12-03 DIAGNOSIS — B351 Tinea unguium: Secondary | ICD-10-CM | POA: Diagnosis not present

## 2024-02-19 ENCOUNTER — Ambulatory Visit: Payer: 59 | Admitting: Urology

## 2024-03-05 ENCOUNTER — Inpatient Hospital Stay: Payer: 59

## 2024-03-06 ENCOUNTER — Ambulatory Visit
Admission: EM | Admit: 2024-03-06 | Discharge: 2024-03-06 | Disposition: A | Discharge status: Home / Self Care | Attending: Physician Assistant | Admitting: Physician Assistant

## 2024-03-06 DIAGNOSIS — R1084 Generalized abdominal pain: Secondary | ICD-10-CM | POA: Diagnosis not present

## 2024-03-06 DIAGNOSIS — R197 Diarrhea, unspecified: Secondary | ICD-10-CM

## 2024-03-06 MED ORDER — ONDANSETRON 4 MG PO TBDP: 4.0000 mg | ORAL_TABLET | Freq: Three times a day (TID) | ORAL | 0 refills | Status: AC | PRN

## 2024-03-06 NOTE — ED Triage Notes (Signed)
 Per caregiver, pt has been having sweats, loose stool and abdominal pain  since this morning.

## 2024-03-06 NOTE — ED Provider Notes (Signed)
 [facility]    CSN: [id number] Arrival date & time: 03/06/24  8295      History   Chief Complaint Chief Complaint  Patient presents with   Abdominal Pain    HPI Kyle Wade is a 68 y.o. male.   Patient brought in by caregiver, resides in group home.  Patient complains of abdominal discomfort, loose stools, fatigue that started today.  Housemate with similar symptoms of diarrhea nausea vomiting.  Denies fever, cough, congestion.  Housemate with negative COVID/flu test.  Patient eating and drinking normally.    Past Medical History:  Diagnosis Date   Acute cholecystitis 01/25/2016   Anxiety    BPH (benign prostatic hyperplasia)    Chronic vomiting    GERD (gastroesophageal reflux disease)    Hypercholesteremia    Hypertension    Mental retardation    OSA (obstructive sleep apnea)    no cpap machine; could not tolerate.   Schatzki's ring    Schizophrenia [facility])     Patient Active Problem List   Diagnosis Date Noted   Elevated PSA 08/07/2023   Tobacco abuse 04/07/2018   Vitamin D deficiency 02/03/2018   Constipation 07/10/2017   Neutropenia (HCC) 05/15/2017   Cholecystitis 01/25/2016   Intellectual disability 01/25/2016   Essential hypertension 01/25/2016   GERD (gastroesophageal reflux disease) 01/25/2016   Incontinence 01/25/2016   Nausea and vomiting 01/03/2016   Imbalance 10/17/2015   Loss of weight 10/17/2015   Dysphagia 09/01/2014   Encounter for screening colonoscopy 09/01/2014   Elevated LFTs 08/29/2014   Dyspepsia 08/29/2014   Schizophrenia (HCC) 05/02/2014   High blood pressure 05/02/2014    Past Surgical History:  Procedure Laterality Date   BIOPSY  05/14/2017   Procedure: BIOPSY;  Surgeon: [name], MD;  [facility];  Service: Endoscopy;;  gastric polyp bx   CHOLECYSTECTOMY N/A 01/26/2016   Procedure: LAPAROSCOPIC CHOLECYSTECTOMY;  Surgeon: [name], MD;  Location: [facility];  Service: General;  Laterality: N/A;    COLONOSCOPY N/A 09/21/2014   [name]: right-sided diverticulosis, internal hemorrhoids. Repeat in 10 years.    COLONOSCOPY WITH PROPOFOL N/A 05/14/2017   [name]: diverticulosis in ascending colon, otherwise normal   ESOPHAGOGASTRODUODENOSCOPY N/A 09/21/2014   [name]: Subtle non-critical Schatzki's ring s/p 56 F dilation, query occult cervical esophageal web. Hiatal hernia. Question gastroparesis.    ESOPHAGOGASTRODUODENOSCOPY (EGD) WITH PROPOFOL N/A 05/14/2017   [name]: normal esophagus s/p empiric dilation, benign gastric polyps, normal duodenum   HIGH INTENSITY FOCUSED ULTRASOUND (HIFU) OF THE PROSTATE N/A 08/07/2023   Procedure: HIGH INTENSITY FOCUSED ULTRASOUND (HIFU) OF THE PROSTATE;  Surgeon: Malen Gauze, MD;  Location: [facility];  Service: Urology;  Laterality: N/A;   MALONEY DILATION N/A 09/21/2014   Procedure: Elease Hashimoto DILATION;  Surgeon: [name], MD;  [facility];  Service: Endoscopy;  Laterality: N/A;   MALONEY DILATION N/A 05/14/2017   Procedure: Elease Hashimoto DILATION;  Surgeon: [name], MD;  [facility];  Service: Endoscopy;  Laterality: N/A;   None     PROSTATE BIOPSY N/A 08/07/2023   Procedure: PROSTATE BIOPSY;  Surgeon: Malen Gauze, MD;  Location: [facility];  Service: Urology;  Laterality: N/A;   SAVORY DILATION N/A 09/21/2014   Procedure: SAVORY DILATION;  Surgeon: [name], MD;  [facility];  Service: Endoscopy;  Laterality: N/A;   TEE WITHOUT CARDIOVERSION N/A 02/02/2018   Procedure: TRANSESOPHAGEAL ECHOCARDIOGRAM (TEE) WITH PROPOFOL;  Surgeon: Tenny Craw,  Sherol Dade, MD;  [facility];  Service: Cardiovascular;  Laterality: N/A;       Home Medications    Prior to Admission medications   Medication Sig Start Date End Date Taking? Authorizing Provider  ondansetron (ZOFRAN-ODT) 4 MG disintegrating tablet Take 1 tablet (4 mg total) by mouth every 8 (eight) hours as needed for nausea or vomiting. 03/06/24   Yes Ward, Tylene Fantasia, PA-C  amLODipine (NORVASC) 10 MG tablet Take 1 tablet (10 mg total) by mouth daily. 08/29/23 11/27/23  Antoine Poche, MD  ammonium lactate (AMLACTIN) 12 % cream Apply 1 Application topically 2 (two) times daily. 02/27/22   [provider]  atorvastatin (LIPITOR) 20 MG tablet Take 1 tablet (20 mg total) by mouth daily. 08/29/23 11/27/23  Antoine Poche, MD  buPROPion (WELLBUTRIN SR) 150 MG 12 hr tablet Take 150 mg by mouth 2 (two) times daily.    [provider]  DEXILANT 60 MG capsule TAKE 1 CAPSULE BY MOUTH ONCE A DAY. 07/12/20   Gelene Mink, NP  docusate sodium (COLACE) 100 MG capsule TAKE (1) CAPSULE BY MOUTH TWICE DAILY FOR CONSTIPATION. 12/15/19   Anice Paganini, NP  ENSURE (ENSURE) USE 1 CAN BY MOUTH TWICE DAILY 10/07/18   Gelene Mink, NP  famotidine (PEPCID) 40 MG tablet Take 40 mg by mouth at bedtime.    [provider]  fluticasone (FLONASE) 50 MCG/ACT nasal spray Place 2 sprays into both nostrils daily.    [provider]  mirtazapine (REMERON) 15 MG tablet TAKE 1 TABLET BY MOUTH DAILY 1 HOUR BEFORE BEDTIME. Patient taking differently: Take 7.5 mg by mouth at bedtime. 12/24/17   Aliene Beams, MD  oxybutynin (DITROPAN) 5 MG tablet TAKE 1 TABLET BY MOUTH EVERY MORNING. 01/27/18   Aliene Beams, MD  potassium chloride (MICRO-K) 10 MEQ CR capsule Take 10 mEq by mouth 2 (two) times daily.    [provider]  psyllium (METAMUCIL) 58.6 % powder Take 1 packet by mouth daily. 8 oz of water    [provider]  risperiDONE (RISPERDAL) 1 MG tablet Take 1 mg by mouth 3 (three) times daily. 07/09/22   [provider]  tamsulosin (FLOMAX) 0.4 MG CAPS capsule TAKE 1 CAPSULE BY MOUTH ONCE DAILY 30 MINUTES AFTER THE SAME MEAL EACH DAY. 01/27/18   Aliene Beams, MD  triamterene-hydrochlorothiazide (MAXZIDE-25) 37.5-25 MG tablet TAKE (1) TABLET BY MOUTH DAILY IN THE MORNING FOR HIGH BLOOD PRESSURE. 01/27/18   Aliene Beams, MD  Vitamin D, Ergocalciferol, (DRISDOL) 50000 units CAPS capsule TAKE 1 CAPSULE BY MOUTH ONCE A MONTH ON THE 1ST. DO NOT CRUSH. 03/17/18   Eustace Moore, MD    Family History Family History  Problem Relation Age of Onset   Colon cancer Other        unknown   Hypertension Other    Hyperlipidemia Other     Social History Social History   Tobacco Use   Smoking status: Every Day    Current packs/day: 0.25    Average packs/day: 0.3 packs/day for 30.0 years (7.5 ttl pk-yrs)    Types: Cigarettes   Smokeless tobacco: Never   Tobacco comments:    4 cigarettes a day  Vaping Use   Vaping status: Never Used  Substance Use Topics   Alcohol use: No   Drug use: No     Allergies   Patient has no known allergies.   Review of Systems Review of Systems  Constitutional:  Negative for chills and fever.  HENT:  Negative for ear pain and sore throat.   Eyes:  Negative for pain and visual disturbance.  Respiratory:  Negative for cough and shortness of breath.   Cardiovascular:  Negative for chest pain and palpitations.  Gastrointestinal:  Positive for abdominal pain and diarrhea. Negative for vomiting.  Genitourinary:  Negative for dysuria and hematuria.  Musculoskeletal:  Negative for arthralgias and back pain.  Skin:  Negative for color change and rash.  Neurological:  Negative for seizures and syncope.  All other systems reviewed and are negative.    Physical Exam Triage Vital Signs ED Triage Vitals  Encounter Vitals Group     BP 03/06/24 1058 (!) 154/79     Systolic BP Percentile --      Diastolic BP Percentile --      Pulse Rate 03/06/24 1058 67     Resp 03/06/24 1058 18     Temp 03/06/24 1058 98.4 F (36.9 C)     Temp Source 03/06/24 1058 Oral     SpO2 03/06/24 1058 97 %     Weight --      Height --      Head Circumference --      Peak Flow --      Pain Score 03/06/24 1059 4     Pain Loc --      Pain Education --      Exclude from Growth Chart --     No data found.  Updated Vital Signs BP (!) 154/79 (BP Location: Right Arm)   Pulse 67   Temp 98.4 F (36.9 C) (Oral)   Resp 18   SpO2 97%   Visual Acuity Right Eye Distance:   Left Eye Distance:   Bilateral Distance:    Right Eye Near:   Left Eye Near:    Bilateral Near:     Physical Exam Vitals and nursing note reviewed.  Constitutional:      General: He is not in acute distress.    Appearance: He is well-developed.  HENT:     Head: Normocephalic and atraumatic.  Eyes:     Conjunctiva/sclera: Conjunctivae normal.  Cardiovascular:     Rate and Rhythm: Normal rate and regular rhythm.     Heart sounds: No murmur heard. Pulmonary:     Effort: Pulmonary effort is normal. No respiratory distress.     Breath sounds: Normal breath sounds.  Abdominal:     Palpations: Abdomen is soft.     Tenderness: There is no abdominal tenderness.  Musculoskeletal:        General: No swelling.     Cervical back: Neck supple.  Skin:    General: Skin is warm and dry.     Capillary Refill: Capillary refill takes less than 2 seconds.  Neurological:     Mental Status: He is alert.  Psychiatric:        Mood and Affect: Mood normal.      UC Treatments / Results  Labs (all labs ordered are listed, but only abnormal results are displayed) Labs Reviewed - No data to display  EKG   Radiology No results found.  Procedures Procedures (including critical care time)  Medications Ordered in UC Medications - No data to display  Initial Impression / Assessment and Plan / UC Course  I have reviewed the triage vital signs and the nursing notes.  Pertinent labs & imaging results that were available during my care of the patient were reviewed  by me and considered in my medical decision making (see chart for details).     Patient overall well-appearing in no acute distress, normal abdominal exam.  Vitals within normal limits.  Patient denies nausea at this time.  Recommend Imodium  for diarrhea, push fluids, rest.  ED precautions given. Final Clinical Impressions(s) / UC Diagnoses   Final diagnoses:  Generalized abdominal pain  Diarrhea, unspecified type     Discharge Instructions      Can take Zofran as needed if he develops nausea and vomiting.  Zofran dissolved under the tongue. Recommend Imodium as needed for diarrhea. Make sure patient stays well-hydrated.  Recommend Pedialyte or Gatorade. If symptoms become worse or no improvement recommend return for evaluation.    ED Prescriptions     Medication Sig Dispense Auth. Provider   ondansetron (ZOFRAN-ODT) 4 MG disintegrating tablet Take 1 tablet (4 mg total) by mouth every 8 (eight) hours as needed for nausea or vomiting. 20 tablet Ward, Tylene Fantasia, PA-C      PDMP not reviewed this encounter.   Ward, Tylene Fantasia, PA-C 03/06/24 1152

## 2024-03-06 NOTE — Discharge Instructions (Signed)
 Can take Zofran as needed if he develops nausea and vomiting.  Zofran dissolved under the tongue. Recommend Imodium as needed for diarrhea. Make sure patient stays well-hydrated.  Recommend Pedialyte or Gatorade. If symptoms become worse or no improvement recommend return for evaluation.

## 2024-03-08 ENCOUNTER — Inpatient Hospital Stay

## 2024-03-11 ENCOUNTER — Inpatient Hospital Stay: Payer: 59 | Admitting: Physician Assistant

## 2024-03-11 DIAGNOSIS — L110 Acquired keratosis follicularis: Secondary | ICD-10-CM | POA: Diagnosis not present

## 2024-03-11 DIAGNOSIS — I739 Peripheral vascular disease, unspecified: Secondary | ICD-10-CM | POA: Diagnosis not present

## 2024-03-11 DIAGNOSIS — B351 Tinea unguium: Secondary | ICD-10-CM | POA: Diagnosis not present

## 2024-03-12 ENCOUNTER — Inpatient Hospital Stay: Payer: 59 | Admitting: Oncology

## 2024-03-22 ENCOUNTER — Other Ambulatory Visit: Payer: Self-pay

## 2024-03-22 DIAGNOSIS — C9000 Multiple myeloma not having achieved remission: Secondary | ICD-10-CM

## 2024-03-22 DIAGNOSIS — D696 Thrombocytopenia, unspecified: Secondary | ICD-10-CM

## 2024-03-22 DIAGNOSIS — D72819 Decreased white blood cell count, unspecified: Secondary | ICD-10-CM

## 2024-03-22 DIAGNOSIS — D500 Iron deficiency anemia secondary to blood loss (chronic): Secondary | ICD-10-CM

## 2024-03-23 ENCOUNTER — Inpatient Hospital Stay: Attending: Hematology

## 2024-03-23 DIAGNOSIS — D500 Iron deficiency anemia secondary to blood loss (chronic): Secondary | ICD-10-CM

## 2024-03-23 DIAGNOSIS — D72819 Decreased white blood cell count, unspecified: Secondary | ICD-10-CM | POA: Diagnosis not present

## 2024-03-23 DIAGNOSIS — D696 Thrombocytopenia, unspecified: Secondary | ICD-10-CM | POA: Insufficient documentation

## 2024-03-23 DIAGNOSIS — F17210 Nicotine dependence, cigarettes, uncomplicated: Secondary | ICD-10-CM | POA: Diagnosis not present

## 2024-03-23 DIAGNOSIS — C9000 Multiple myeloma not having achieved remission: Secondary | ICD-10-CM

## 2024-03-23 DIAGNOSIS — Z79899 Other long term (current) drug therapy: Secondary | ICD-10-CM | POA: Insufficient documentation

## 2024-03-23 LAB — CBC WITH DIFFERENTIAL/PLATELET
Abs Immature Granulocytes: 0 10*3/uL (ref 0.00–0.07)
Basophils Absolute: 0 10*3/uL (ref 0.0–0.1)
Basophils Relative: 0 %
Eosinophils Absolute: 0 10*3/uL (ref 0.0–0.5)
Eosinophils Relative: 1 %
HCT: 42.8 % (ref 39.0–52.0)
Hemoglobin: 14.3 g/dL (ref 13.0–17.0)
Immature Granulocytes: 0 %
Lymphocytes Relative: 33 %
Lymphs Abs: 1 10*3/uL (ref 0.7–4.0)
MCH: 31 pg (ref 26.0–34.0)
MCHC: 33.4 g/dL (ref 30.0–36.0)
MCV: 92.6 fL (ref 80.0–100.0)
Monocytes Absolute: 0.3 10*3/uL (ref 0.1–1.0)
Monocytes Relative: 10 %
Neutro Abs: 1.7 10*3/uL (ref 1.7–7.7)
Neutrophils Relative %: 56 %
Platelets: 115 10*3/uL — ABNORMAL LOW (ref 150–400)
RBC: 4.62 MIL/uL (ref 4.22–5.81)
RDW: 12.3 % (ref 11.5–15.5)
WBC: 3 10*3/uL — ABNORMAL LOW (ref 4.0–10.5)
nRBC: 0 % (ref 0.0–0.2)

## 2024-03-23 LAB — FOLATE: Folate: 16.4 ng/mL (ref >5.9)

## 2024-03-23 LAB — VITAMIN B12: Vitamin B-12: 619 pg/mL (ref 180–914)

## 2024-03-23 LAB — LACTATE DEHYDROGENASE: LDH: 212 U/L — ABNORMAL HIGH (ref 98–192)

## 2024-03-24 DIAGNOSIS — Q800 Ichthyosis vulgaris: Secondary | ICD-10-CM | POA: Diagnosis not present

## 2024-03-24 DIAGNOSIS — E559 Vitamin D deficiency, unspecified: Secondary | ICD-10-CM | POA: Diagnosis not present

## 2024-03-24 DIAGNOSIS — M1990 Unspecified osteoarthritis, unspecified site: Secondary | ICD-10-CM | POA: Diagnosis not present

## 2024-03-24 DIAGNOSIS — E7800 Pure hypercholesterolemia, unspecified: Secondary | ICD-10-CM | POA: Diagnosis not present

## 2024-03-24 DIAGNOSIS — K5900 Constipation, unspecified: Secondary | ICD-10-CM | POA: Diagnosis not present

## 2024-03-24 DIAGNOSIS — J302 Other seasonal allergic rhinitis: Secondary | ICD-10-CM | POA: Diagnosis not present

## 2024-03-24 DIAGNOSIS — Z0000 Encounter for general adult medical examination without abnormal findings: Secondary | ICD-10-CM | POA: Diagnosis not present

## 2024-03-24 DIAGNOSIS — I10 Essential (primary) hypertension: Secondary | ICD-10-CM | POA: Diagnosis not present

## 2024-03-24 LAB — KAPPA/LAMBDA LIGHT CHAINS
Kappa free light chain: 18.6 mg/L (ref 3.3–19.4)
Kappa, lambda light chain ratio: 1.21 (ref 0.26–1.65)
Lambda free light chains: 15.4 mg/L (ref 5.7–26.3)

## 2024-03-24 LAB — RHEUMATOID FACTOR: Rheumatoid fact SerPl-aCnc: <10 [IU]/mL (ref <14.0)

## 2024-03-25 LAB — METHYLMALONIC ACID, SERUM: Methylmalonic Acid, Quantitative: 108 nmol/L (ref 0–378)

## 2024-03-25 LAB — PROTEIN ELECTROPHORESIS, SERUM
A/G Ratio: 1.3 (ref 0.7–1.7)
Albumin ELP: 3.9 g/dL (ref 2.9–4.4)
Alpha-1-Globulin: 0.2 g/dL (ref 0.0–0.4)
Alpha-2-Globulin: 0.5 g/dL (ref 0.4–1.0)
Beta Globulin: 1.1 g/dL (ref 0.7–1.3)
Gamma Globulin: 1.2 g/dL (ref 0.4–1.8)
Globulin, Total: 3 g/dL (ref 2.2–3.9)
Total Protein ELP: 6.9 g/dL (ref 6.0–8.5)

## 2024-03-26 LAB — IMMUNOFIXATION ELECTROPHORESIS
IgA: 249 mg/dL (ref 61–437)
IgG (Immunoglobin G), Serum: 1229 mg/dL (ref 603–1613)
IgM (Immunoglobulin M), Srm: 44 mg/dL (ref 20–172)
Total Protein ELP: 6.8 g/dL (ref 6.0–8.5)

## 2024-03-26 LAB — COPPER, SERUM: Copper: 97 ug/dL (ref 69–132)

## 2024-03-29 NOTE — Progress Notes (Unsigned)
 [facility] [address], [place] [address]   CLINIC:  Medical Oncology/Hematology  PCP:  [facility] [address] [place] [address] [phone]   REASON FOR VISIT:  Follow-up for leukopenia and thrombocytopenia  PRIOR THERAPY: None  CURRENT THERAPY: Surveillance  INTERVAL HISTORY:   [name] 68 y.o. male returns for routine follow-up of his leukopenia and thrombocytopenia.  He was last seen by Rojelio Brenner PA-C on 03/12/2023.  *** He is accompanied today by a staff member of his group home.  At today's visit, he reports feeling ***. *** No recent hospitalizations, surgeries, or changes in baseline health status.  Neck send *** he denies any recent infections, lymphadenopathy, or B symptoms. *** He reports that his gums bleed easily when he brushes his teeth. *** He denies any abnormal bruising or petechial rash.  He continues to take his risperidone and mirtazapine.  *** *** He denies any new medications or dose changes in the last year.    He has 75***% energy and 90***% appetite. He endorses that he is maintaining a stable weight.   ASSESSMENT & PLAN:  1.  Mild leukopenia and thrombocytopenia: - Leukopenia since November 2016, with intermittent neutropenia.  Thrombocytopenia since February 2017. - CTA abdomen/pelvis (2017) showed no gross abnormality of liver or spleen - ANA and RF negative.  No known history of connective tissue or autoimmune disease.   - Drug-induced etiology suspected, as both risperidone and mirtazapine are known to cause mild leukopenia and thrombocytopenia.*** - Does not report any fevers, night sweats or weight loss in the last 1 year.***  No infections or antibiotic use. - No abnormal bruising or petechial rash.  He reports gum bleeding when he brushes his teeth, likely due to poor dentition.*** - Most recent labs (03/23/2024): Platelets 115, WBC 3.0/ANC 1.7, normal Hgb Normal B12, MMA,  folate, copper Normal SPEP, immunofixation, light chains. LDH mildly elevated 212. - Likely etiology of leukopenia and thrombocytopenia is drug-induced.   - PLAN: Labs and RTC in 1 year ***  2.  Other history - Patient resides at [facility].  PLAN SUMMARY: >> Labs in 1 year = CBC/D, LDH, immature platelet fraction >> OFFICE visit in 1 year (1 week after labs     REVIEW OF SYSTEMS: ***  Review of Systems  Constitutional:  Positive for fatigue. Negative for appetite change, chills, diaphoresis, fever and unexpected weight change.  HENT:   Negative for lump/mass and nosebleeds.   Eyes:  Negative for eye problems.  Respiratory:  Positive for shortness of breath. Negative for cough and hemoptysis.   Cardiovascular:  Negative for chest pain, leg swelling and palpitations.  Gastrointestinal:  Negative for abdominal pain, blood in stool, constipation, diarrhea, nausea and vomiting.  Genitourinary:  Positive for difficulty urinating. Negative for hematuria.   Skin: Negative.   Neurological:  Negative for dizziness, headaches and light-headedness.  Hematological:  Bruises/bleeds easily (Gums bleed when brushing teeth).     PHYSICAL EXAM:***  ECOG PERFORMANCE STATUS: 0 - Asymptomatic  There were no vitals filed for this visit. There were no vitals filed for this visit. Physical Exam Constitutional:      Appearance: Normal appearance. He is normal weight.  Cardiovascular:     Heart sounds: Murmur heard.  Pulmonary:     Breath sounds: Normal breath sounds.  Neurological:     General: No focal deficit present.     Mental Status: Mental status is at baseline.  Psychiatric:        Speech: Speech is delayed.        Behavior: Behavior normal. Behavior is cooperative.     Comments: Patient is slow to answer questions, but responds appropriately.    PAST MEDICAL/SURGICAL HISTORY:  Past Medical History:  Diagnosis Date   Acute cholecystitis 01/25/2016   Anxiety    BPH (benign  prostatic hyperplasia)    Chronic vomiting    GERD (gastroesophageal reflux disease)    Hypercholesteremia    Hypertension    Mental retardation    OSA (obstructive sleep apnea)    no cpap machine; could not tolerate.   Schatzki's ring    Schizophrenia [facility])    Past Surgical History:  Procedure Laterality Date   BIOPSY  05/14/2017   Procedure: BIOPSY;  Surgeon: Suzette Espy, MD;  [facility];  Service: Endoscopy;;  gastric polyp bx   CHOLECYSTECTOMY N/A 01/26/2016   Procedure: LAPAROSCOPIC CHOLECYSTECTOMY;  Surgeon: Alanda Allegra, MD;  Location: [facility];  Service: General;  Laterality: N/A;   COLONOSCOPY N/A 09/21/2014   Dr. Riley Cheadle: right-sided diverticulosis, internal hemorrhoids. Repeat in 10 years.    COLONOSCOPY WITH PROPOFOL N/A 05/14/2017   Dr. Riley Cheadle: diverticulosis in ascending colon, otherwise normal   ESOPHAGOGASTRODUODENOSCOPY N/A 09/21/2014   Dr. Riley Cheadle: Subtle non-critical Schatzki's ring s/p 56 F dilation, query occult cervical esophageal web. Hiatal hernia. Question gastroparesis.    ESOPHAGOGASTRODUODENOSCOPY (EGD) WITH PROPOFOL N/A 05/14/2017   Dr. Riley Cheadle: normal esophagus s/p empiric dilation, benign gastric polyps, normal duodenum   HIGH INTENSITY FOCUSED ULTRASOUND (HIFU) OF THE PROSTATE N/A 08/07/2023   Procedure: HIGH INTENSITY FOCUSED ULTRASOUND (HIFU) OF THE PROSTATE;  Surgeon: Marco Severs, MD;  Location: [facility];  Service: Urology;  Laterality: N/A;   MALONEY DILATION N/A 09/21/2014   Procedure: Londa Rival DILATION;  Surgeon: Suzette Espy, MD;  [facility]: [facility];  Service: Endoscopy;  Laterality: N/A;   MALONEY DILATION N/A 05/14/2017   Procedure: Londa Rival DILATION;  Surgeon: Suzette Espy, MD;  [facility]: [facility];  Service: Endoscopy;  Laterality: N/A;   None     PROSTATE BIOPSY N/A 08/07/2023   Procedure: PROSTATE BIOPSY;  Surgeon: Marco Severs, MD;  Location: [facility];  Service: Urology;  Laterality: N/A;   SAVORY DILATION N/A  09/21/2014   Procedure: SAVORY DILATION;  Surgeon: Suzette Espy, MD;  [facility];  Service: Endoscopy;  Laterality: N/A;   TEE WITHOUT CARDIOVERSION N/A 02/02/2018   Procedure: TRANSESOPHAGEAL ECHOCARDIOGRAM (TEE) WITH PROPOFOL;  Surgeon: Elmyra Haggard, MD;  [facility]: [facility];  Service: Cardiovascular;  Laterality: N/A;    SOCIAL HISTORY:  Social History   Socioeconomic History   Marital status: Single    Spouse name: Not on file   Number of children: Not on file   Years of education: Not on file   Highest education level: Not on file  Occupational History   Not on file  Tobacco Use   Smoking status: Every Day    Current packs/day: 0.25    Average packs/day: 0.3 packs/day for 30.0 years (7.5 ttl pk-yrs)    Types: Cigarettes   Smokeless tobacco: Never   Tobacco comments:    4 cigarettes a day  Vaping Use   Vaping status: Never Used  Substance and Sexual Activity   Alcohol use: No   Drug use: No   Sexual activity: Never    Birth control/protection: None  Other Topics Concern   Not  on file  Social History Narrative   Lives in Group Home.    Smokes cigarettes qd.   Able to smoke cigarettes at 8, 12, 4, 8pm.    Seen by [name] in [facility].    [facility] -[facility].      Legal guardian: Armed forces logistics/support/administrative officer [employer] services.    Social Drivers of Corporate investment banker Strain: Not on file  Food Insecurity: Not on file  Transportation Needs: Not on file  Physical Activity: Not on file  Stress: Not on file  Social Connections: Not on file  Intimate Partner Violence: Not on file    FAMILY HISTORY:  Family History  Problem Relation Age of Onset   Colon cancer Other        unknown   Hypertension Other    Hyperlipidemia Other     CURRENT MEDICATIONS:  Outpatient Encounter Medications as of 03/30/2024  Medication Sig   amLODipine (NORVASC) 10 MG tablet Take 1 tablet (10 mg total) by mouth daily.   ammonium lactate  (AMLACTIN) 12 % cream Apply 1 Application topically 2 (two) times daily.   atorvastatin (LIPITOR) 20 MG tablet Take 1 tablet (20 mg total) by mouth daily.   buPROPion (WELLBUTRIN SR) 150 MG 12 hr tablet Take 150 mg by mouth 2 (two) times daily.   DEXILANT 60 MG capsule TAKE 1 CAPSULE BY MOUTH ONCE A DAY.   docusate sodium (COLACE) 100 MG capsule TAKE (1) CAPSULE BY MOUTH TWICE DAILY FOR CONSTIPATION.   ENSURE (ENSURE) USE 1 CAN BY MOUTH TWICE DAILY   famotidine (PEPCID) 40 MG tablet Take 40 mg by mouth at bedtime.   fluticasone (FLONASE) 50 MCG/ACT nasal spray Place 2 sprays into both nostrils daily.   mirtazapine (REMERON) 15 MG tablet TAKE 1 TABLET BY MOUTH DAILY 1 HOUR BEFORE BEDTIME. (Patient taking differently: Take 7.5 mg by mouth at bedtime.)   ondansetron (ZOFRAN-ODT) 4 MG disintegrating tablet Take 1 tablet (4 mg total) by mouth every 8 (eight) hours as needed for nausea or vomiting.   oxybutynin (DITROPAN) 5 MG tablet TAKE 1 TABLET BY MOUTH EVERY MORNING.   potassium chloride (MICRO-K) 10 MEQ CR capsule Take 10 mEq by mouth 2 (two) times daily.   psyllium (METAMUCIL) 58.6 % powder Take 1 packet by mouth daily. 8 oz of water   risperiDONE (RISPERDAL) 1 MG tablet Take 1 mg by mouth 3 (three) times daily.   tamsulosin (FLOMAX) 0.4 MG CAPS capsule TAKE 1 CAPSULE BY MOUTH ONCE DAILY 30 MINUTES AFTER THE SAME MEAL EACH DAY.   triamterene-hydrochlorothiazide (MAXZIDE-25) 37.5-25 MG tablet TAKE (1) TABLET BY MOUTH DAILY IN THE MORNING FOR HIGH BLOOD PRESSURE.   Vitamin D, Ergocalciferol, (DRISDOL) 50000 units CAPS capsule TAKE 1 CAPSULE BY MOUTH ONCE A MONTH ON THE 1ST. DO NOT CRUSH.   No facility-administered encounter medications on file as of 03/30/2024.    ALLERGIES:  No Known Allergies  LABORATORY DATA:  I have reviewed the labs as listed.  CBC    Component Value Date/Time   WBC 3.0 (L) 03/23/2024 1128   RBC 4.62 03/23/2024 1128   HGB 14.3 03/23/2024 1128   HCT 42.8 03/23/2024  1128   HCT 44.3 10/30/2016 1143   PLT 115 (L) 03/23/2024 1128   MCV 92.6 03/23/2024 1128   MCH 31.0 03/23/2024 1128   MCHC 33.4 03/23/2024 1128   RDW 12.3 03/23/2024 1128   LYMPHSABS 1.0 03/23/2024 1128   MONOABS 0.3 03/23/2024 1128  EOSABS 0.0 03/23/2024 1128   BASOSABS 0.0 03/23/2024 1128      Latest Ref Rng & Units 08/04/2023    1:31 PM 08/26/2022   10:41 AM 01/09/2022    3:05 PM  CMP  Glucose 70 - 99 mg/dL 161  60  75   BUN 8 - 23 mg/dL 11  12  8    Creatinine 0.61 - 1.24 mg/dL 0.96  0.45  4.09   Sodium 135 - 145 mmol/L 132  136  137   Potassium 3.5 - 5.1 mmol/L 3.5  3.7  3.6   Chloride 98 - 111 mmol/L 96  99  97   CO2 22 - 32 mmol/L 27  28  36   Calcium 8.9 - 10.3 mg/dL 9.0  9.7  9.9   Total Protein 6.5 - 8.1 g/dL  8.2  7.4   Total Bilirubin 0.3 - 1.2 mg/dL  0.9  0.5   Alkaline Phos 38 - 126 U/L  63  66   AST 15 - 41 U/L  23  18   ALT 0 - 44 U/L  21  24     DIAGNOSTIC IMAGING:  I have independently reviewed the relevant imaging and discussed with the patient.   WRAP UP:  All questions were answered. The patient knows to call the clinic with any problems, questions or concerns.  Medical decision making: Low***  Time spent on visit: I spent 15 minutes counseling the patient face to face. The total time spent in the appointment was 22 minutes and more than 50% was on counseling.  Sonnie Dusky, PA-C  ***

## 2024-03-30 ENCOUNTER — Inpatient Hospital Stay (HOSPITAL_BASED_OUTPATIENT_CLINIC_OR_DEPARTMENT_OTHER): Admitting: Physician Assistant

## 2024-03-30 VITALS — BP 131/79 | HR 76 | Temp 97.4°F | Resp 18 | Ht 67.0 in | Wt 155.0 lb

## 2024-03-30 DIAGNOSIS — Z79899 Other long term (current) drug therapy: Secondary | ICD-10-CM | POA: Diagnosis not present

## 2024-03-30 DIAGNOSIS — F17210 Nicotine dependence, cigarettes, uncomplicated: Secondary | ICD-10-CM | POA: Diagnosis not present

## 2024-03-30 DIAGNOSIS — D696 Thrombocytopenia, unspecified: Secondary | ICD-10-CM | POA: Diagnosis not present

## 2024-03-30 DIAGNOSIS — D72819 Decreased white blood cell count, unspecified: Secondary | ICD-10-CM

## 2024-03-30 NOTE — Patient Instructions (Signed)
 [facility] at [facility] **VISIT SUMMARY & IMPORTANT INSTRUCTIONS **   You were seen today by Sheril Dines PA-C for your low white blood cells and low platelets.   Your low white blood cells and low platelets are most likely medication side effects (from risperidone and mirtazapine). You can continue to take your same dose of risperidone and mirtazapine, since it is only causing mild decrease in white blood cells and platelets.  FOLLOW-UP APPOINTMENT: Office visit in 1 year  ** Thank you for trusting me with your healthcare!  I strive to provide all of my patients with quality care at each visit.  If you receive a survey for this visit, I would be so grateful to you for taking the time to provide feedback.  Thank you in advance!  ~ Miron Marxen                   Dr. Paulett Boros   &   Sheril Dines, PA-C   - - - - - - - - - - - - - - - - - -    Thank you for choosing [facility] at [facility] to provide your oncology and hematology care.  To afford each patient quality time with our provider, please arrive at least 15 minutes before your scheduled appointment time.   If you have a lab appointment with the Cancer Center please come in thru the Main Entrance and check in at the main information desk.  You need to re-schedule your appointment should you arrive 10 or more minutes late.  We strive to give you quality time with our providers, and arriving late affects you and other patients whose appointments are after yours.  Also, if you no show three or more times for appointments you may be dismissed from the clinic at the providers discretion.     Again, thank you for choosing [facility].  Our hope is that these requests will decrease the amount of time that you wait before being seen by our physicians.       _____________________________________________________________  Should you have questions after your visit to  [facility], please contact our office at [phone] and follow the prompts.  Our office hours are 8:00 a.m. and 4:30 p.m. Monday - Friday.  Please note that voicemails left after 4:00 p.m. may not be returned until the following business day.  We are closed weekends and major holidays.  You do have access to a nurse 24-7, just call the main number to the clinic [phone] and do not press any options, hold on the line and a nurse will answer the phone.    For prescription refill requests, have your pharmacy contact our office and allow 72 hours.

## 2024-03-31 DIAGNOSIS — Z1329 Encounter for screening for other suspected endocrine disorder: Secondary | ICD-10-CM | POA: Diagnosis not present

## 2024-03-31 DIAGNOSIS — I10 Essential (primary) hypertension: Secondary | ICD-10-CM | POA: Diagnosis not present

## 2024-03-31 DIAGNOSIS — Z130 Encounter for screening for diseases of the blood and blood-forming organs and certain disorders involving the immune mechanism: Secondary | ICD-10-CM | POA: Diagnosis not present

## 2024-06-03 ENCOUNTER — Ambulatory Visit: Admitting: Urology

## 2024-07-01 DIAGNOSIS — I739 Peripheral vascular disease, unspecified: Secondary | ICD-10-CM | POA: Diagnosis not present

## 2024-07-01 DIAGNOSIS — B351 Tinea unguium: Secondary | ICD-10-CM | POA: Diagnosis not present

## 2024-07-01 DIAGNOSIS — L110 Acquired keratosis follicularis: Secondary | ICD-10-CM | POA: Diagnosis not present

## 2024-07-06 DIAGNOSIS — I10 Essential (primary) hypertension: Secondary | ICD-10-CM | POA: Diagnosis not present

## 2024-07-06 DIAGNOSIS — I341 Nonrheumatic mitral (valve) prolapse: Secondary | ICD-10-CM | POA: Diagnosis not present

## 2024-07-06 DIAGNOSIS — I340 Nonrheumatic mitral (valve) insufficiency: Secondary | ICD-10-CM | POA: Diagnosis not present

## 2024-07-06 DIAGNOSIS — F17200 Nicotine dependence, unspecified, uncomplicated: Secondary | ICD-10-CM | POA: Diagnosis not present

## 2024-07-07 ENCOUNTER — Other Ambulatory Visit (HOSPITAL_COMMUNITY): Payer: Self-pay | Admitting: Family Medicine

## 2024-07-07 DIAGNOSIS — I341 Nonrheumatic mitral (valve) prolapse: Secondary | ICD-10-CM

## 2024-07-07 DIAGNOSIS — Z136 Encounter for screening for cardiovascular disorders: Secondary | ICD-10-CM

## 2024-07-07 DIAGNOSIS — F17200 Nicotine dependence, unspecified, uncomplicated: Secondary | ICD-10-CM

## 2024-07-15 ENCOUNTER — Ambulatory Visit: Attending: Cardiology | Admitting: Cardiology

## 2024-07-15 VITALS — BP 125/82 | HR 83 | Ht 66.0 in | Wt 150.0 lb

## 2024-07-15 DIAGNOSIS — I10 Essential (primary) hypertension: Secondary | ICD-10-CM

## 2024-07-15 DIAGNOSIS — E782 Mixed hyperlipidemia: Secondary | ICD-10-CM | POA: Diagnosis not present

## 2024-07-15 DIAGNOSIS — I340 Nonrheumatic mitral (valve) insufficiency: Secondary | ICD-10-CM

## 2024-07-15 DIAGNOSIS — H25813 Combined forms of age-related cataract, bilateral: Secondary | ICD-10-CM | POA: Diagnosis not present

## 2024-07-15 NOTE — Patient Instructions (Signed)
 Medication Instructions:  Your physician recommends that you continue on your current medications as directed. Please refer to the Current Medication list given to you today.   Labwork: Fasting lipid panel and CMET to be completed at [facility]  Testing/Procedures: Your physician has requested that you have an echocardiogram. Echocardiography is a painless test that uses sound waves to create images of your heart. It provides your doctor with information about the size and shape of your heart and how well your heart's chambers and valves are working. This procedure takes approximately one hour. There are no restrictions for this procedure. Please do NOT wear cologne, perfume, aftershave, or lotions (deodorant is allowed). Please arrive 15 minutes prior to your appointment time.  Please note: We ask at that you not bring children with you during ultrasound (echo/ vascular) testing. Due to room size and safety concerns, children are not allowed in the ultrasound rooms during exams. Our front office staff cannot provide observation of children in our lobby area while testing is being conducted. An adult accompanying a patient to their appointment will only be allowed in the ultrasound room at the discretion of the ultrasound technician under special circumstances. We apologize for any inconvenience.   Follow-Up: Your physician recommends that you schedule a follow-up appointment in: 6 months  Any Other Special Instructions Will Be Listed Below (If Applicable).  If you need a refill on your cardiac medications before your next appointment, please call your pharmacy.

## 2024-07-15 NOTE — Progress Notes (Signed)
 Clinical Summary Kyle Wade is a 68 y.o.male seen today for follow up of the following medical problems.    1. MItral regurgitation/heart murmur - newly diagnosed heart murmur detected by pcp - echo Jan 2019 LVEF 60-65%, no WMAs, normal diastolic function, moderate to severe MR. LVIDs 19. The MV VTI/AV VTI of 1.5 would actually suggest severe   MR    - 01/2018 TEE that indicated moderate MR.  - 01/2019 TTE: moderate MR   - no recent edema. No SOB/DOE. Does daily chores without troubles.    09/2020 echo: LVEF 60-65%, moderate MR, LVIDs 2.5    07/2022 echo: LVEF 70-75%, reported LVIDs 1.13, no WMAs, grade I dd, mod MR  - no recent SOB/DOE, no LE edema.   - no recent SOB/DOE,    - 2. HTN - he is compliant with meds   3. Cognitive deficit/Mental handicap - has legal guardian   4. Schizophrenia - followed by pcp      5. HLD  08/2022 TC 186 TG 79 HDL 73 LDL 97 ASCVD 10 year risk score is 32% - has been started on atorvastatin  20mg  daily     SH: [name] with [facility] is his guardian Past Medical History:  Diagnosis Date   Acute cholecystitis 01/25/2016   Anxiety    BPH (benign prostatic hyperplasia)    Chronic vomiting    GERD (gastroesophageal reflux disease)    Hypercholesteremia    Hypertension    Mental retardation    OSA (obstructive sleep apnea)    no cpap machine; could not tolerate.   Schatzki's ring    Schizophrenia (HCC)      No Known Allergies   Current Outpatient Medications  Medication Sig Dispense Refill   amLODipine  (NORVASC ) 10 MG tablet Take 1 tablet (10 mg total) by mouth daily. 180 tablet 3   ammonium lactate (AMLACTIN) 12 % cream Apply 1 Application topically 2 (two) times daily.     atorvastatin  (LIPITOR) 20 MG tablet Take 1 tablet (20 mg total) by mouth daily. 90 tablet 3   buPROPion  (WELLBUTRIN  SR) 150 MG 12 hr tablet Take 150 mg by mouth 2 (two) times daily.     D3 HIGH POTENCY 50 MCG (2000 UT) CAPS Take 1 capsule by  mouth daily.     DEXILANT  60 MG capsule TAKE 1 CAPSULE BY MOUTH ONCE A DAY. 30 capsule 11   docusate sodium (COLACE) 100 MG capsule TAKE (1) CAPSULE BY MOUTH TWICE DAILY FOR CONSTIPATION. 60 capsule 3   ENSURE (ENSURE) USE 1 CAN BY MOUTH TWICE DAILY 2370 mL PRN   famotidine  (PEPCID ) 40 MG tablet Take 40 mg by mouth at bedtime.     fluticasone  (FLONASE ) 50 MCG/ACT nasal spray Place 2 sprays into both nostrils daily.     losartan (COZAAR) 50 MG tablet Take 50 mg by mouth daily.     mirtazapine (REMERON) 15 MG tablet TAKE 1 TABLET BY MOUTH DAILY 1 HOUR BEFORE BEDTIME. (Patient taking differently: Take 7.5 mg by mouth at bedtime.) 30 tablet 11   ondansetron  (ZOFRAN -ODT) 4 MG disintegrating tablet Take 1 tablet (4 mg total) by mouth every 8 (eight) hours as needed for nausea or vomiting. 20 tablet 0   oxybutynin  (DITROPAN ) 5 MG tablet TAKE 1 TABLET BY MOUTH EVERY MORNING. 30 tablet 11   potassium chloride (KLOR-CON) 10 MEQ tablet Take 10 mEq by mouth 2 (two) times daily.     psyllium (METAMUCIL) 58.6 % powder Take  1 packet by mouth daily. 8 oz of water      risperiDONE (RISPERDAL) 1 MG tablet Take 1 mg by mouth 3 (three) times daily.     tamsulosin  (FLOMAX ) 0.4 MG CAPS capsule TAKE 1 CAPSULE BY MOUTH ONCE DAILY 30 MINUTES AFTER THE SAME MEAL EACH DAY. 30 capsule 11   triamterene -hydrochlorothiazide  (MAXZIDE -25) 37.5-25 MG tablet TAKE (1) TABLET BY MOUTH DAILY IN THE MORNING FOR HIGH BLOOD PRESSURE. 30 tablet 11   Vitamin D , Ergocalciferol , (DRISDOL) 50000 units CAPS capsule TAKE 1 CAPSULE BY MOUTH ONCE A MONTH ON THE 1ST. DO NOT CRUSH. 1 capsule 11   No current facility-administered medications for this visit.     Past Surgical History:  Procedure Laterality Date   BIOPSY  05/14/2017   Procedure: BIOPSY;  Surgeon: Shaaron Lamar HERO, MD;  Location: [facility];  Service: Endoscopy;;  gastric polyp bx   CHOLECYSTECTOMY N/A 01/26/2016   Procedure: LAPAROSCOPIC CHOLECYSTECTOMY;  Surgeon: Oneil Budge,  MD;  Location: [facility];  Service: General;  Laterality: N/A;   COLONOSCOPY N/A 09/21/2014   Dr. Shaaron: right-sided diverticulosis, internal hemorrhoids. Repeat in 10 years.    COLONOSCOPY WITH PROPOFOL  N/A 05/14/2017   Dr. Shaaron: diverticulosis in ascending colon, otherwise normal   ESOPHAGOGASTRODUODENOSCOPY N/A 09/21/2014   Dr. Shaaron: Subtle non-critical Schatzki's ring s/p 56 F dilation, query occult cervical esophageal web. Hiatal hernia. Question gastroparesis.    ESOPHAGOGASTRODUODENOSCOPY (EGD) WITH PROPOFOL  N/A 05/14/2017   Dr. Shaaron: normal esophagus s/p empiric dilation, benign gastric polyps, normal duodenum   HIGH INTENSITY FOCUSED ULTRASOUND (HIFU) OF THE PROSTATE N/A 08/07/2023   Procedure: HIGH INTENSITY FOCUSED ULTRASOUND (HIFU) OF THE PROSTATE;  Surgeon: Sherrilee Belvie CROME, MD;  Location: [facility];  Service: Urology;  Laterality: N/A;   MALONEY DILATION N/A 09/21/2014   Procedure: AGAPITO DILATION;  Surgeon: Lamar HERO Shaaron, MD;  Location: [facility];  Service: Endoscopy;  Laterality: N/A;   MALONEY DILATION N/A 05/14/2017   Procedure: AGAPITO DILATION;  Surgeon: Shaaron Lamar HERO, MD;  Location: [facility];  Service: Endoscopy;  Laterality: N/A;   None     PROSTATE BIOPSY N/A 08/07/2023   Procedure: PROSTATE BIOPSY;  Surgeon: Sherrilee Belvie CROME, MD;  Location: [facility];  Service: Urology;  Laterality: N/A;   SAVORY DILATION N/A 09/21/2014   Procedure: SAVORY DILATION;  Surgeon: Lamar HERO Shaaron, MD;  Location: [facility];  Service: Endoscopy;  Laterality: N/A;   TEE WITHOUT CARDIOVERSION N/A 02/02/2018   Procedure: TRANSESOPHAGEAL ECHOCARDIOGRAM (TEE) WITH PROPOFOL ;  Surgeon: Okey Vina GAILS, MD;  Location: [facility];  Service: Cardiovascular;  Laterality: N/A;     No Known Allergies    Family History  Problem Relation Age of Onset   Colon cancer Other        unknown   Hypertension Other    Hyperlipidemia Other      Social History Mr. Taranto reports that he has been  smoking cigarettes. He has a 7.5 pack-year smoking history. He has never used smokeless tobacco. Mr. Tuller reports no history of alcohol use.    Physical Examination Today's Vitals   07/15/24 1053  BP: 125/82  Pulse: 83  SpO2: 98%  Weight: 150 lb (68 kg)  Height: 5' 6 (1.676 m)   Body mass index is 24.21 kg/m.  Gen: resting comfortably, no acute distress HEENT: no scleral icterus, pupils equal round and reactive, no palptable cervical adenopathy,  CV: RRR, 3/6 systolic murmur apex, no jvd Resp: Clear to auscultation  bilaterally GI: abdomen is soft, non-tender, non-distended, normal bowel sounds, no hepatosplenomegaly MSK: extremities are warm, no edema.  Skin: warm, no rash Neuro:  no focal deficits Psych: appropriate affect   Diagnostic Studies 01/2019 TTE   IMPRESSIONS      1. The left ventricle has normal systolic function with an ejection fraction of 60-65%. The cavity size was normal. There is mild concentric left ventricular hypertrophy. Left ventricular diastolic parameters were normal No evidence of left ventricular regional wall motion abnormalities.  2. The right ventricle has normal systolic function. The cavity was normal. There is no increase in right ventricular wall thickness.  3. The mitral valve is myxomatous. Mild thickening of the mitral valve leaflet. Mitral valve regurgitation is moderate by color flow Doppler. Severity may be underestimated due to jet eccentricity.  4. The tricuspid valve is normal in structure.  5. The aortic valve is tricuspid.  6. The pulmonic valve was grossly normal. Pulmonic valve regurgitation is mild by color flow Doppler.  7. The aortic root is normal in size and structure.  8. No evidence of left ventricular regional wall motion abnormalities.     09/2020 echo IMPRESSIONS     1. Left ventricular ejection fraction, by estimation, is 60 to 65%. The  left ventricle has normal function. The left ventricle has no regional   wall motion abnormalities. There is moderate left ventricular hypertrophy.  Left ventricular diastolic  parameters were normal.   2. Right ventricular systolic function is normal. The right ventricular  size is normal. There is normal pulmonary artery systolic pressure.   3. Left atrial size was mildly dilated.   4. 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 The eccentric jet is difficult to  quantify. The MV/AV VTI ratio is 1.4 suggesting moderate MR. . The mitral  valve is abnormal. Moderate mitral valve  regurgitation. No evidence of mitral stenosis.   5. The aortic valve has an indeterminant number of cusps. There is mild  calcification of the aortic valve. There is mild thickening of the aortic  valve. Aortic valve regurgitation is not visualized. No aortic stenosis is  present.   6. The inferior vena cava is normal in size with greater than 50%  respiratory variability, suggesting right atrial pressure of 3 mmHg.       07/2022 echo IMPRESSIONS     1. Left ventricular ejection fraction, by estimation, is 70 to 75%. The  left ventricle has hyperdynamic function. The left ventricle has no  regional wall motion abnormalities. There is mild left ventricular  hypertrophy. Left ventricular diastolic  parameters are consistent with Grade I diastolic dysfunction (impaired  relaxation). The average left ventricular global longitudinal strain is  -24.0 %. The global longitudinal strain is normal.   2. Right ventricular systolic function is normal. The right ventricular  size is normal. There is normal pulmonary artery systolic pressure. The  estimated right ventricular systolic pressure is 18.2 mmHg.   3. Left atrial size was mildly dilated.   4. The mitral valve is myxomatous. There is moderate prolapse of the  posterior leaflet and partially anterior leaflet. At least moderate mitral  valve regurgitation, eccentric.   5. The  aortic valve is tricuspid. Aortic valve regurgitation is not  visualized.   6. The inferior vena cava is normal in size with greater than 50%  respiratory variability, suggesting right atrial pressure of 3 mmHg.       Assessment and Plan  1. Mitral regurgitation - no symptoms, will repeat echo   2. HTN -at goal, continue current meds   3. HLD -repeat lipid panel   EKG today shows NSR  F/u 6 months   Dorn PHEBE Ross, M.D.

## 2024-07-16 ENCOUNTER — Ambulatory Visit: Admitting: Cardiology

## 2024-07-20 ENCOUNTER — Ambulatory Visit (HOSPITAL_COMMUNITY)
Admission: RE | Admit: 2024-07-20 | Discharge: 2024-07-20 | Disposition: A | Discharge status: Home / Self Care | Source: Ambulatory Visit | Attending: Family Medicine | Admitting: Family Medicine

## 2024-07-20 ENCOUNTER — Other Ambulatory Visit (HOSPITAL_COMMUNITY)
Admission: RE | Admit: 2024-07-20 | Discharge: 2024-07-20 | Disposition: A | Discharge status: Home / Self Care | Source: Ambulatory Visit | Attending: Cardiology | Admitting: Cardiology

## 2024-07-20 DIAGNOSIS — E782 Mixed hyperlipidemia: Secondary | ICD-10-CM | POA: Diagnosis not present

## 2024-07-20 DIAGNOSIS — F17200 Nicotine dependence, unspecified, uncomplicated: Secondary | ICD-10-CM | POA: Insufficient documentation

## 2024-07-20 DIAGNOSIS — Z136 Encounter for screening for cardiovascular disorders: Secondary | ICD-10-CM | POA: Insufficient documentation

## 2024-07-20 DIAGNOSIS — I341 Nonrheumatic mitral (valve) prolapse: Secondary | ICD-10-CM | POA: Diagnosis not present

## 2024-07-20 DIAGNOSIS — I340 Nonrheumatic mitral (valve) insufficiency: Secondary | ICD-10-CM | POA: Insufficient documentation

## 2024-07-20 LAB — COMPREHENSIVE METABOLIC PANEL WITH GFR
ALT: 31 U/L (ref 0–44)
AST: 29 U/L (ref 15–41)
Albumin: 4.2 g/dL (ref 3.5–5.0)
Alkaline Phosphatase: 74 U/L (ref 38–126)
Anion gap: 9 (ref 5–15)
BUN: 12 mg/dL (ref 8–23)
CO2: 28 mmol/L (ref 22–32)
Calcium: 9.1 mg/dL (ref 8.9–10.3)
Chloride: 102 mmol/L (ref 98–111)
Creatinine, Ser: 1.23 mg/dL (ref 0.61–1.24)
GFR, Estimated: >60 mL/min (ref >60)
Glucose, Bld: 90 mg/dL (ref 70–99)
Potassium: 3.7 mmol/L (ref 3.5–5.1)
Sodium: 139 mmol/L (ref 135–145)
Total Bilirubin: 0.7 mg/dL (ref 0.0–1.2)
Total Protein: 7.5 g/dL (ref 6.5–8.1)

## 2024-07-20 LAB — LIPID PANEL
Cholesterol: 132 mg/dL (ref 0–200)
HDL: 70 mg/dL (ref >40)
LDL Cholesterol: 52 mg/dL (ref 0–99)
Total CHOL/HDL Ratio: 1.9 ratio
Triglycerides: 49 mg/dL (ref <150)
VLDL: 10 mg/dL (ref 0–40)

## 2024-08-11 ENCOUNTER — Ambulatory Visit: Payer: Self-pay | Admitting: Cardiology

## 2024-08-11 ENCOUNTER — Ambulatory Visit: Attending: Cardiology

## 2024-08-11 DIAGNOSIS — I340 Nonrheumatic mitral (valve) insufficiency: Secondary | ICD-10-CM | POA: Diagnosis not present

## 2024-08-11 LAB — ECHOCARDIOGRAM COMPLETE
AR max vel: 2.91 cm2
AV Peak grad: 4.8 mmHg
Ao pk vel: 1.1 m/s
Area-P 1/2: 3.83 cm2
Calc EF: 65.7 %
MV M vel: 5.58 m/s
MV Peak grad: 124.5 mmHg
S' Lateral: 2.9 cm
Single Plane A2C EF: 72.2 %
Single Plane A4C EF: 58.9 %

## 2024-08-13 ENCOUNTER — Encounter: Payer: Self-pay | Admitting: *Deleted

## 2024-09-21 ENCOUNTER — Encounter: Payer: Self-pay | Admitting: *Deleted

## 2025-03-24 ENCOUNTER — Ambulatory Visit: Admitting: Cardiology

## 2025-03-24 ENCOUNTER — Other Ambulatory Visit

## 2025-03-31 ENCOUNTER — Inpatient Hospital Stay: Admitting: Oncology

## 2025-03-31 ENCOUNTER — Ambulatory Visit: Admitting: Oncology
